# Patient Record
Sex: Female | Born: 1990 | Race: White | Hispanic: No | Marital: Single | Smoking: Current every day smoker
Health system: Southern US, Community
[De-identification: ages and names within clinical notes are randomized; demographics above are authoritative.]

## PROBLEM LIST (undated history)

## (undated) ENCOUNTER — Inpatient Hospital Stay (HOSPITAL_COMMUNITY): Payer: Self-pay

## (undated) ENCOUNTER — Ambulatory Visit

## (undated) DIAGNOSIS — K219 Gastro-esophageal reflux disease without esophagitis: Secondary | ICD-10-CM

## (undated) DIAGNOSIS — G4730 Sleep apnea, unspecified: Secondary | ICD-10-CM

## (undated) DIAGNOSIS — F32A Depression, unspecified: Secondary | ICD-10-CM

## (undated) DIAGNOSIS — N390 Urinary tract infection, site not specified: Secondary | ICD-10-CM

## (undated) DIAGNOSIS — B999 Unspecified infectious disease: Secondary | ICD-10-CM

## (undated) DIAGNOSIS — E282 Polycystic ovarian syndrome: Secondary | ICD-10-CM

## (undated) DIAGNOSIS — M1990 Unspecified osteoarthritis, unspecified site: Secondary | ICD-10-CM

## (undated) DIAGNOSIS — J45909 Unspecified asthma, uncomplicated: Secondary | ICD-10-CM

## (undated) DIAGNOSIS — F329 Major depressive disorder, single episode, unspecified: Secondary | ICD-10-CM

## (undated) DIAGNOSIS — R011 Cardiac murmur, unspecified: Secondary | ICD-10-CM

## (undated) DIAGNOSIS — F419 Anxiety disorder, unspecified: Secondary | ICD-10-CM

## (undated) HISTORY — DX: Unspecified osteoarthritis, unspecified site: M19.90

## (undated) HISTORY — PX: CHOLECYSTECTOMY: SHX55

## (undated) HISTORY — PX: HERNIA REPAIR: SHX51

## (undated) HISTORY — PX: HAND SURGERY: SHX662

## (undated) HISTORY — DX: Sleep apnea, unspecified: G47.30

---
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
## 2012-01-29 HISTORY — PX: CHOLECYSTECTOMY: SHX55

## 2012-02-04 LAB — OB RESULTS CONSOLE ABO/RH: RH Type: POSITIVE

## 2012-02-04 LAB — OB RESULTS CONSOLE HEPATITIS B SURFACE ANTIGEN: Hepatitis B Surface Ag: NEGATIVE

## 2012-02-04 LAB — OB RESULTS CONSOLE ANTIBODY SCREEN: Antibody Screen: NEGATIVE

## 2012-02-04 LAB — OB RESULTS CONSOLE RPR: RPR: NONREACTIVE

## 2012-02-04 LAB — CYSTIC FIBROSIS DIAGNOSTIC STUDY: Interpretation-CFDNA:: NEGATIVE

## 2012-02-04 LAB — OB RESULTS CONSOLE VARICELLA ZOSTER ANTIBODY, IGG: Varicella: IMMUNE

## 2012-03-24 ENCOUNTER — Inpatient Hospital Stay (HOSPITAL_COMMUNITY): Payer: Self-pay

## 2012-03-24 ENCOUNTER — Encounter (HOSPITAL_COMMUNITY): Payer: Self-pay

## 2012-03-24 ENCOUNTER — Inpatient Hospital Stay (HOSPITAL_COMMUNITY)
Admission: AD | Admit: 2012-03-24 | Discharge: 2012-03-24 | Disposition: A | Discharge status: Home / Self Care | Payer: Self-pay | Source: Ambulatory Visit | Attending: Obstetrics & Gynecology | Admitting: Obstetrics & Gynecology

## 2012-03-24 DIAGNOSIS — R109 Unspecified abdominal pain: Secondary | ICD-10-CM | POA: Insufficient documentation

## 2012-03-24 DIAGNOSIS — O4690 Antepartum hemorrhage, unspecified, unspecified trimester: Secondary | ICD-10-CM

## 2012-03-24 DIAGNOSIS — B9689 Other specified bacterial agents as the cause of diseases classified elsewhere: Secondary | ICD-10-CM

## 2012-03-24 DIAGNOSIS — O4692 Antepartum hemorrhage, unspecified, second trimester: Secondary | ICD-10-CM

## 2012-03-24 DIAGNOSIS — O2390 Unspecified genitourinary tract infection in pregnancy, unspecified trimester: Secondary | ICD-10-CM | POA: Insufficient documentation

## 2012-03-24 DIAGNOSIS — N72 Inflammatory disease of cervix uteri: Secondary | ICD-10-CM

## 2012-03-24 DIAGNOSIS — N760 Acute vaginitis: Secondary | ICD-10-CM | POA: Insufficient documentation

## 2012-03-24 DIAGNOSIS — A499 Bacterial infection, unspecified: Secondary | ICD-10-CM | POA: Insufficient documentation

## 2012-03-24 HISTORY — DX: Urinary tract infection, site not specified: N39.0

## 2012-03-24 HISTORY — DX: Unspecified infectious disease: B99.9

## 2012-03-24 HISTORY — DX: Cardiac murmur, unspecified: R01.1

## 2012-03-24 HISTORY — DX: Sleep apnea, unspecified: G47.30

## 2012-03-24 HISTORY — DX: Major depressive disorder, single episode, unspecified: F32.9

## 2012-03-24 HISTORY — DX: Anxiety disorder, unspecified: F41.9

## 2012-03-24 HISTORY — DX: Unspecified asthma, uncomplicated: J45.909

## 2012-03-24 HISTORY — DX: Polycystic ovarian syndrome: E28.2

## 2012-03-24 HISTORY — DX: Depression, unspecified: F32.A

## 2012-03-24 LAB — URINALYSIS, ROUTINE W REFLEX MICROSCOPIC
Bilirubin Urine: NEGATIVE
Glucose, UA: NEGATIVE mg/dL
Hgb urine dipstick: NEGATIVE
Ketones, ur: NEGATIVE mg/dL
pH: 8.5 — ABNORMAL HIGH (ref 5.0–8.0)

## 2012-03-24 LAB — CBC WITH DIFFERENTIAL/PLATELET
Basophils Absolute: 0 10*3/uL (ref 0.0–0.1)
Basophils Relative: 0 % (ref 0–1)
Eosinophils Relative: 1 % (ref 0–5)
Lymphocytes Relative: 21 % (ref 12–46)
MCHC: 34.2 g/dL (ref 30.0–36.0)
Neutro Abs: 6.3 10*3/uL (ref 1.7–7.7)
Platelets: 217 10*3/uL (ref 150–400)
RDW: 15.6 % — ABNORMAL HIGH (ref 11.5–15.5)
WBC: 8.7 10*3/uL (ref 4.0–10.5)

## 2012-03-24 LAB — WET PREP, GENITAL
Trich, Wet Prep: NONE SEEN
Yeast Wet Prep HPF POC: NONE SEEN

## 2012-03-24 LAB — ABO/RH: ABO/RH(D): A POS

## 2012-03-24 MED ORDER — AZITHROMYCIN 250 MG PO TABS
1000.0000 mg | ORAL_TABLET | Freq: Once | ORAL | Status: AC
  Administered 2012-03-24: 1000 mg via ORAL
  Filled 2012-03-24: qty 4

## 2012-03-24 MED ORDER — METRONIDAZOLE 500 MG PO TABS: 500.0000 mg | ORAL_TABLET | Freq: Two times a day (BID) | ORAL | Status: DC

## 2012-03-24 NOTE — MAU Note (Signed)
abd pain started last night, comes and goes, some pressure. Spotting this month, has had some spotting through out the preg, has not had an [place].

## 2012-03-24 NOTE — MAU Provider Note (Signed)
Attestation of Attending Supervision of Advanced Practitioner (CNM/NP): Evaluation and management procedures were performed by the Advanced Practitioner under my supervision and collaboration.  I have reviewed the Advanced Practitioner's note and chart, and I agree with the management and plan.  HARRAWAY-SMITH, Jaquon Gingerich 3:08 PM     

## 2012-03-24 NOTE — MAU Note (Signed)
Pt states has had 2 prenatal appts in [place], here today with lower abd cramping that radiates to her back. Has had spotting once or twice this month, however earlier today noted redder spotting than noted prior. Otherwise no abnormal vaginal discharge. Pain is intermittent, began last pm. Denies burning or urgency with voiding.

## 2012-03-24 NOTE — MAU Provider Note (Signed)
History     CSN: [id number]  Arrival date and time: 03/24/12 1020   None     Chief Complaint  Patient presents with  . Abdominal Cramping   HPI Taylor Buck is a 22 yo G1P0 female @[redacted]w[redacted]d  by LMP who presents to the MAU complaining of vaginal bleeding and abdominal cramping. She reports that she has had vaginal spotting throughout her whole pregnancy. She reports that the bleeding is never enough to soak a pad, and is only visible when she wipes. She denies any heavy vaginal bleeding, abnormal vaginal discharge or vaginal itching/lesions.   She reports that she has also had mild lower abdominal cramping for the last 3-4 weeks, and reports that the cramping spread to both sides this week, which worried her. She describes the pain as cramping and denies any sharp pains. She states that the pain comes and goes. Both her abdominal pain and her occasional lower back pain are relieved by tylenol. She denies any urinary symptoms. She reports mild nausea and vomiting throughout the last few weeks.  She reports that she has had high blood sugars during childhood and was placed on Glucophage for preventative measures, however states she has never been told she was a Type I or Type II diabetic and has never taken insulin. She is currently not on any diabetes medications, however checks her blood sugars regularly and reports they are on average between 110-120.  The patient is from [place], and has established prenatal care there.   OB History   Grav Para Term Preterm Abortions TAB SAB Ect Mult Living   1               Past Medical History  Diagnosis Date  . Sleep apnea   . Heart murmur   . Asthma   . Diabetes mellitus without complication     borderline, last A1c 6.1  . Urinary tract infection   . PCOS (polycystic ovarian syndrome)   . Infection     chlamydia age 65  . Anxiety   . Depression     Past Surgical History  Procedure Laterality Date  . Hernia repair      rt inguinal  . Hand surgery      reconstruction- injury due to frostbite as child    Family History  Problem Relation Age of Onset  . Diabetes Mother   . COPD Mother   . Diabetes Maternal Aunt   . Cancer Maternal Aunt     breast  . Diabetes Maternal Grandmother   . Cancer Maternal Grandmother     skin cancer  . Hearing loss Neg Hx     History  Substance Use Topics  . Smoking status: Former Smoker    Types: Cigarettes  . Smokeless tobacco: Never Used     Comment: Jan 2014  . Alcohol Use: No    Allergies:  Allergies  Allergen Reactions  . Milk-Related Compounds Anaphylaxis  . Latex Hives  . Penicillins Hives    Prescriptions prior to admission  Medication Sig Dispense Refill  . acetaminophen (TYLENOL) 325 MG tablet Take 650 mg by mouth daily as needed for pain.      . calcium carbonate (TUMS - DOSED IN MG ELEMENTAL CALCIUM) 500 MG chewable tablet Chew 1 tablet by mouth 4 (four) times daily as needed for heartburn.      . Prenatal Vit-Fe Fumarate-FA (PRENATAL MULTIVITAMIN) TABS Take 1 tablet by mouth at bedtime.        Review  of Systems  Constitutional: Negative for fever and chills.  Eyes: Negative for blurred vision and double vision.  Respiratory: Negative for cough and shortness of breath.   Cardiovascular: Positive for leg swelling. Negative for chest pain.  Gastrointestinal: Positive for nausea, vomiting and abdominal pain (bilateral lower abdominal cramping). Negative for heartburn, diarrhea and constipation.  Genitourinary: Negative for dysuria, urgency and frequency.  Musculoskeletal: Positive for back pain.  Neurological: Negative for dizziness and headaches.  Psychiatric/Behavioral: Negative for depression. The patient is not nervous/anxious.     Blood pressure 110/56, pulse 78, temperature 97.9 F (36.6 C), temperature source Oral, resp. rate 18, height 5' 5.5" (1.664 m), weight 102.683 kg (226 lb 6 oz), last menstrual period 12/03/2011.  Physical  Exam General appearance - alert, well appearing, and in no distress and oriented to person, place, and time Mental status - alert, oriented to person, place, and time, normal mood, behavior, speech, dress, motor activity, and thought processes Eyes - pupils equal and reactive, extraocular eye movements intact Neck - supple, no significant adenopathy Chest - clear to auscultation, no wheezes, rales or rhonchi, symmetric air entry Heart - normal rate, regular rhythm, normal S1, S2, no murmurs, rubs, clicks or gallops Abdomen - soft, nontender, nondistended, no masses or organomegaly bowel sounds normal Pelvic - VULVA: normal appearing vulva with no masses, tenderness or lesions, VAGINA: normal appearing vagina with normal color, no lesions, vaginal discharge - white and creamy, CERVIX: normal appearing cervix without discharge or lesions, friable cervix with bleeding, cervical discharge present - bloody, UTERUS: uterus is 16 week size, shape, consistency and nontender, ADNEXA: normal adnexa in size, nontender and no masses Musculoskeletal - full range of motion without pain Extremities - peripheral pulses normal, no pedal edema, no clubbing or cyanosis Skin - normal coloration and turgor, no rashes, no suspicious skin lesions noted   MAU Course  Procedures  Results for orders placed during the hospital encounter of 03/24/12 (from the past 24 hour(s))  URINALYSIS, ROUTINE W REFLEX MICROSCOPIC     Status: Abnormal   Collection Time    03/24/12 10:45 AM      Result Value Range   Color, Urine YELLOW  YELLOW   APPearance CLOUDY (*) CLEAR   Specific Gravity, Urine 1.015  1.005 - 1.030   pH 8.5 (*) 5.0 - 8.0   Glucose, UA NEGATIVE  NEGATIVE mg/dL   Hgb urine dipstick NEGATIVE  NEGATIVE   Bilirubin Urine NEGATIVE  NEGATIVE   Ketones, ur NEGATIVE  NEGATIVE mg/dL   Protein, ur NEGATIVE  NEGATIVE mg/dL   Urobilinogen, UA 0.2  0.0 - 1.0 mg/dL   Nitrite NEGATIVE  NEGATIVE   Leukocytes, UA NEGATIVE   NEGATIVE  CBC WITH DIFFERENTIAL     Status: Abnormal   Collection Time    03/24/12 11:41 AM      Result Value Range   WBC 8.7  4.0 - 10.5 K/uL   RBC 4.38  3.87 - 5.11 MIL/uL   Hemoglobin 12.0  12.0 - 15.0 g/dL   HCT 40.9 (*) 81.1 - 91.4 %   MCV 80.1  78.0 - 100.0 fL   MCH 27.4  26.0 - 34.0 pg   MCHC 34.2  30.0 - 36.0 g/dL   RDW 78.2 (*) 95.6 - 21.3 %   Platelets 217  150 - 400 K/uL   Neutrophils Relative 72  43 - 77 %   Neutro Abs 6.3  1.7 - 7.7 K/uL   Lymphocytes Relative 21  12 -  46 %   Lymphs Abs 1.8  0.7 - 4.0 K/uL   Monocytes Relative 5  3 - 12 %   Monocytes Absolute 0.5  0.1 - 1.0 K/uL   Eosinophils Relative 1  0 - 5 %   Eosinophils Absolute 0.1  0.0 - 0.7 K/uL   Basophils Relative 0  0 - 1 %   Basophils Absolute 0.0  0.0 - 0.1 K/uL  ABO/RH     Status: None   Collection Time    03/24/12 11:50 AM      Result Value Range   ABO/RH(D) A POS    WET PREP, GENITAL     Status: Abnormal   Collection Time    03/24/12 12:55 PM      Result Value Range   Yeast Wet Prep HPF POC NONE SEEN  NONE SEEN   Trich, Wet Prep NONE SEEN  NONE SEEN   Clue Cells Wet Prep HPF POC MODERATE (*) NONE SEEN   WBC, Wet Prep HPF POC MODERATE (*) NONE SEEN     Assessment and Plan  Assessment: Taylor Buck is a 22 yo female who presents to MAU for vaginal bleeding and abdominal cramping.  1. IUP @ [redacted]w[redacted]d by LMP (confirmed by [place] today) 2. Round ligament pain 3. Cervicitis 4. Bacterial Vaginosis  Plan: 1. IUP: Patient given information about 2nd trimester pregnancy and recommended patient to make an appointment with her OBGYN in [place] for her 18-20 week anatomical Korea and prenatal visit. 2. Round ligament pain: Continue to use tylenol for pain, rest when necessary, follow-up with OBGYN in [place]. 3. Cervicitis: Treated with Azithromycin 1g today. Follow-up with OBGYN in [place]. 4. BV: Treatment with Metronidazole 500 mg BID x 7days 5. Cultures for GC and Chlamydia pending Adela Glimpse 03/24/2012, 12:54 PM    I have examined this patient with the student and assisted with plan of care.  I have reviewed this patient's vital signs, nurses notes, appropriate labs and imaging.  Findings discussed with the patient.    Medication List    TAKE these medications       acetaminophen 325 MG tablet  Commonly known as:  TYLENOL  Take 650 mg by mouth daily as needed for pain.     calcium carbonate 500 MG chewable tablet  Commonly known as:  TUMS - dosed in mg elemental calcium  Chew 1 tablet by mouth 4 (four) times daily as needed for heartburn.     metroNIDAZOLE 500 MG tablet  Commonly known as:  FLAGYL  Take 1 tablet (500 mg total) by mouth 2 (two) times daily.     prenatal multivitamin Tabs  Take 1 tablet by mouth at bedtime.

## 2012-04-22 ENCOUNTER — Ambulatory Visit (INDEPENDENT_AMBULATORY_CARE_PROVIDER_SITE_OTHER): Payer: Medicaid Other

## 2012-04-22 ENCOUNTER — Other Ambulatory Visit: Payer: Medicaid Other

## 2012-04-22 ENCOUNTER — Encounter: Payer: Self-pay | Admitting: Obstetrics

## 2012-04-22 ENCOUNTER — Ambulatory Visit (INDEPENDENT_AMBULATORY_CARE_PROVIDER_SITE_OTHER): Payer: Medicaid Other | Admitting: Obstetrics

## 2012-04-22 VITALS — BP 98/48 | Temp 97.6°F | Wt 288.0 lb

## 2012-04-22 DIAGNOSIS — Z3400 Encounter for supervision of normal first pregnancy, unspecified trimester: Secondary | ICD-10-CM

## 2012-04-22 DIAGNOSIS — Z3201 Encounter for pregnancy test, result positive: Secondary | ICD-10-CM

## 2012-04-22 DIAGNOSIS — Z369 Encounter for antenatal screening, unspecified: Secondary | ICD-10-CM

## 2012-04-22 LAB — POCT URINALYSIS DIPSTICK
Blood, UA: NEGATIVE
Glucose, UA: NEGATIVE
Nitrite, UA: NEGATIVE
Protein, UA: NEGATIVE
Urobilinogen, UA: NEGATIVE

## 2012-04-22 MED ORDER — OMEPRAZOLE 20 MG PO CPDR: 20.0000 mg | DELAYED_RELEASE_CAPSULE | Freq: Every day | ORAL | Status: DC

## 2012-04-22 MED ORDER — OB COMPLETE PETITE 35-5-1-200 MG PO CAPS: 1.0000 | ORAL_CAPSULE | Freq: Every morning | ORAL | Status: DC

## 2012-04-22 NOTE — Progress Notes (Signed)
Heartburn.  Omeprazole Rx. 

## 2012-04-22 NOTE — Progress Notes (Signed)
Pulse: 72

## 2012-04-24 ENCOUNTER — Encounter: Payer: Self-pay | Admitting: Obstetrics

## 2012-04-26 ENCOUNTER — Encounter (HOSPITAL_COMMUNITY): Payer: Self-pay | Admitting: Nurse Practitioner

## 2012-04-26 ENCOUNTER — Emergency Department (HOSPITAL_COMMUNITY)
Admission: EM | Admit: 2012-04-26 | Discharge: 2012-04-26 | Disposition: A | Discharge status: Home / Self Care | Payer: Medicaid Other | Attending: Emergency Medicine | Admitting: Emergency Medicine

## 2012-04-26 DIAGNOSIS — R350 Frequency of micturition: Secondary | ICD-10-CM | POA: Insufficient documentation

## 2012-04-26 DIAGNOSIS — R112 Nausea with vomiting, unspecified: Secondary | ICD-10-CM

## 2012-04-26 DIAGNOSIS — R011 Cardiac murmur, unspecified: Secondary | ICD-10-CM | POA: Insufficient documentation

## 2012-04-26 DIAGNOSIS — N390 Urinary tract infection, site not specified: Secondary | ICD-10-CM

## 2012-04-26 DIAGNOSIS — Z8659 Personal history of other mental and behavioral disorders: Secondary | ICD-10-CM | POA: Insufficient documentation

## 2012-04-26 DIAGNOSIS — Z8739 Personal history of other diseases of the musculoskeletal system and connective tissue: Secondary | ICD-10-CM | POA: Insufficient documentation

## 2012-04-26 DIAGNOSIS — O99330 Smoking (tobacco) complicating pregnancy, unspecified trimester: Secondary | ICD-10-CM | POA: Insufficient documentation

## 2012-04-26 DIAGNOSIS — O2390 Unspecified genitourinary tract infection in pregnancy, unspecified trimester: Secondary | ICD-10-CM | POA: Insufficient documentation

## 2012-04-26 DIAGNOSIS — O9989 Other specified diseases and conditions complicating pregnancy, childbirth and the puerperium: Secondary | ICD-10-CM | POA: Insufficient documentation

## 2012-04-26 DIAGNOSIS — Z79899 Other long term (current) drug therapy: Secondary | ICD-10-CM | POA: Insufficient documentation

## 2012-04-26 DIAGNOSIS — Z8619 Personal history of other infectious and parasitic diseases: Secondary | ICD-10-CM | POA: Insufficient documentation

## 2012-04-26 DIAGNOSIS — Z3490 Encounter for supervision of normal pregnancy, unspecified, unspecified trimester: Secondary | ICD-10-CM

## 2012-04-26 DIAGNOSIS — O219 Vomiting of pregnancy, unspecified: Secondary | ICD-10-CM | POA: Insufficient documentation

## 2012-04-26 DIAGNOSIS — J45909 Unspecified asthma, uncomplicated: Secondary | ICD-10-CM | POA: Insufficient documentation

## 2012-04-26 LAB — URINALYSIS, MICROSCOPIC ONLY
Hgb urine dipstick: NEGATIVE
Specific Gravity, Urine: 1.027 (ref 1.005–1.030)
Urobilinogen, UA: 1 mg/dL (ref 0.0–1.0)
pH: 8.5 — ABNORMAL HIGH (ref 5.0–8.0)

## 2012-04-26 LAB — COMPREHENSIVE METABOLIC PANEL
ALT: 13 U/L (ref 0–35)
AST: 16 U/L (ref 0–37)
Albumin: 3.2 g/dL — ABNORMAL LOW (ref 3.5–5.2)
Alkaline Phosphatase: 88 U/L (ref 39–117)
Calcium: 8.9 mg/dL (ref 8.4–10.5)
Potassium: 3.5 mEq/L (ref 3.5–5.1)
Sodium: 134 mEq/L — ABNORMAL LOW (ref 135–145)
Total Protein: 7.1 g/dL (ref 6.0–8.3)

## 2012-04-26 LAB — CBC WITH DIFFERENTIAL/PLATELET
Basophils Absolute: 0 10*3/uL (ref 0.0–0.1)
Basophils Relative: 0 % (ref 0–1)
Eosinophils Absolute: 0 10*3/uL (ref 0.0–0.7)
Eosinophils Relative: 0 % (ref 0–5)
Lymphs Abs: 0.6 10*3/uL — ABNORMAL LOW (ref 0.7–4.0)
MCH: 28 pg (ref 26.0–34.0)
Neutrophils Relative %: 87 % — ABNORMAL HIGH (ref 43–77)
Platelets: 202 10*3/uL (ref 150–400)
RBC: 4.46 MIL/uL (ref 3.87–5.11)
RDW: 14.5 % (ref 11.5–15.5)

## 2012-04-26 LAB — POCT PREGNANCY, URINE: Preg Test, Ur: POSITIVE — AB

## 2012-04-26 MED ORDER — ONDANSETRON 8 MG PO TBDP: 8.0000 mg | ORAL_TABLET | Freq: Three times a day (TID) | ORAL | Status: DC | PRN

## 2012-04-26 MED ORDER — CEPHALEXIN 500 MG PO CAPS: 500.0000 mg | ORAL_CAPSULE | Freq: Four times a day (QID) | ORAL | Status: DC

## 2012-04-26 MED ORDER — SODIUM CHLORIDE 0.9 % IV SOLN
1000.0000 mL | Freq: Once | INTRAVENOUS | Status: AC
  Administered 2012-04-26: 1000 mL via INTRAVENOUS

## 2012-04-26 MED ORDER — DEXTROSE 5 % IV SOLN
1.0000 g | Freq: Once | INTRAVENOUS | Status: AC
  Administered 2012-04-26: 1 g via INTRAVENOUS
  Filled 2012-04-26: qty 10

## 2012-04-26 MED ORDER — ONDANSETRON HCL 4 MG/2ML IJ SOLN
4.0000 mg | Freq: Once | INTRAMUSCULAR | Status: AC
  Administered 2012-04-26: 4 mg via INTRAVENOUS
  Filled 2012-04-26: qty 2

## 2012-04-26 MED ORDER — SODIUM CHLORIDE 0.9 % IV SOLN: 1000.0000 mL | INTRAVENOUS | Status: DC

## 2012-04-26 NOTE — ED Provider Notes (Signed)
History     CSN: [id number]  Arrival date & time 04/26/12  1340   First MD Initiated Contact with Patient 04/26/12 1632      Chief Complaint  Patient presents with  . Nausea     The history is provided by the patient.   patient is a G1 P0 at [redacted] weeks pregnant with a known intrauterine pregnancy based on first trimester ultrasound.  She presents with several days of nausea and vomiting without diarrhea.  Mild decreased oral intake.  She does report some increased urinary frequency over the past several days.  No dysuria.  No flank pain.  No fevers or chills.  She continues to feel the baby move.  No vaginal bleeding or vaginal discharge.  Her symptoms are mild to moderate in severity nothing improves or worsens her symptoms.  No hematemesis.  No recent sick contacts. Past Medical History  Diagnosis Date  . Sleep apnea   . Heart murmur   . Asthma   . Urinary tract infection   . PCOS (polycystic ovarian syndrome)   . Infection     chlamydia age 65  . Anxiety   . Depression   . Arthritis     Past Surgical History  Procedure Laterality Date  . Hernia repair      rt inguinal  . Hand surgery      reconstruction- injury due to frostbite as child    Family History  Problem Relation Age of Onset  . Diabetes Mother   . COPD Mother   . Diabetes Maternal Aunt   . Cancer Maternal Aunt     breast  . Diabetes Maternal Grandmother   . Cancer Maternal Grandmother     skin cancer  . Hearing loss Neg Hx   . Alcoholism    . Arthritis    . Asthma    . Depression      History  Substance Use Topics  . Smoking status: Current Every Day Smoker -- 0.25 packs/day    Types: Cigarettes  . Smokeless tobacco: Never Used     Comment: Jan 2014  . Alcohol Use: No    OB History   Grav Para Term Preterm Abortions TAB SAB Ect Mult Living   1               Review of Systems  All other systems reviewed and are negative.    Allergies  Milk-related compounds; Latex; and  Penicillins  Home Medications   Current Outpatient Rx  Name  Route  Sig  Dispense  Refill  . acetaminophen (TYLENOL) 325 MG tablet   Oral   Take 650 mg by mouth daily as needed for pain.         . calcium carbonate (TUMS - DOSED IN MG ELEMENTAL CALCIUM) 500 MG chewable tablet   Oral   Chew 1 tablet by mouth 4 (four) times daily as needed for heartburn.         . metroNIDAZOLE (FLAGYL) 500 MG tablet   Oral   Take 1 tablet (500 mg total) by mouth 2 (two) times daily.   14 tablet   0   . omeprazole (PRILOSEC) 20 MG capsule   Oral   Take 1 capsule (20 mg total) by mouth daily.   60 capsule   5   . pantoprazole (PROTONIX) 40 MG tablet   Oral   Take 40 mg by mouth daily as needed.         . Prenat-FeCbn-FeAspGl-FA-Omega (  OB COMPLETE PETITE) 35-5-1-200 MG CAPS   Oral   Take 1 capsule by mouth every morning.   90 capsule   3   . Prenatal Vit-Fe Fumarate-FA (PRENATAL MULTIVITAMIN) TABS   Oral   Take 1 tablet by mouth at bedtime.           BP 119/67  Pulse 105  Temp(Src) 98.3 F (36.8 C) (Oral)  Resp 20  SpO2 97%  LMP 12/03/2011  Physical Exam  Nursing note and vitals reviewed. Constitutional: She is oriented to person, place, and time. She appears well-developed and well-nourished. No distress.  HENT:  Head: Normocephalic and atraumatic.  Eyes: EOM are normal.  Neck: Normal range of motion.  Cardiovascular: Normal rate, regular rhythm and normal heart sounds.   Pulmonary/Chest: Effort normal and breath sounds normal.  Abdominal: Soft. She exhibits no distension. There is no tenderness.  Gravid uterus  Genitourinary:  No CVA tenderness  Musculoskeletal: Normal range of motion.  Neurological: She is alert and oriented to person, place, and time.  Skin: Skin is warm and dry.  Psychiatric: She has a normal mood and affect. Judgment normal.    ED Course  Procedures (including critical care time)  OBSTETRICS REPORT (ULTRASOUND) (Signed Final  03/24/2012 12:47 pm) ---------------------------------------------------------------------- Fetal Evaluation Num Of Fetuses: 1 Fetal Heart Rate: 143 bpm Cardiac Activity: Observed Presentation: Breech Placenta: Fundal, above cervical os Amniotic Fluid AFI FV: Subjectively within normal limits Larg Pckt: 6.5 cm ---------------------------------------------------------------------- Biometry BPD: 36.7 mm G. Age: 17w 1d ---------------------------------------------------------------------- Gestational Age LMP: 16w 0d Date: 12/03/11 EDD: 09/08/12 Clinical EDD: [place] 0d EDD: 09/08/12 U/S Today: 17w 1d EDD: 08/31/12 Best: 16w 0d Det. By: LMP (12/03/11) EDD: 09/08/12 ---------------------------------------------------------------------- Cervix Uterus Adnexa Cervical Length: 3.29 cm Cervix: Normal appearance by transabdominal scan. Left Ovary: Size(cm) L: 2.33 x W: 1.59 x H: 1.55 Volume(cc): 3 Right Ovary: Size(cm) L: 2.09 x W: 1.65 x H: 1.29 Volume(cc): 2.3 Adnexa: No abnormality visualized. ---------------------------------------------------------------------- Impression Single live IUP in breech presentation. Concordant measurements/assigned GA by LMP. Recommend routine anatomic survey at 18-20 weeks. No acute abnormality.   Labs Reviewed  CBC WITH DIFFERENTIAL - Abnormal; Notable for the following:    HCT 35.0 (*)    Neutrophils Relative 87 (*)    Lymphocytes Relative 7 (*)    Lymphs Abs 0.6 (*)    All other components within normal limits  COMPREHENSIVE METABOLIC PANEL - Abnormal; Notable for the following:    Sodium 134 (*)    Albumin 3.2 (*)    All other components within normal limits  URINALYSIS, MICROSCOPIC ONLY - Abnormal; Notable for the following:    Color, Urine AMBER (*)    APPearance HAZY (*)    pH 8.5 (*)    Bilirubin Urine SMALL (*)    Ketones, ur 15 (*)    Protein, ur 100 (*)    Leukocytes, UA MODERATE (*)    Bacteria, UA FEW (*)    Squamous Epithelial /  LPF MANY (*)    Casts HYALINE CASTS (*)    All other components within normal limits  POCT PREGNANCY, URINE - Abnormal; Notable for the following:    Preg Test, Ur POSITIVE (*)    All other components within normal limits  URINE CULTURE  LIPASE, BLOOD   No results found.   No diagnosis found.    MDM  5:44 PM The patient feels much better at this time.  This is likely nausea vomiting unrelated to her pregnancy.  She may have  a urinary tract infection.  Rocephin given in the emergency department.  Home with Keflex.  Urine culture pending.  Antibiotics can be stopped when she follows up with her OB/GYN if her urine culture is negative.  Well-appearing.  [redacted] weeks pregnant.        Lyanne Co, MD 04/26/12 1800

## 2012-04-26 NOTE — ED Notes (Addendum)
Pt c/o n/v since yesterday. Unable to tolerate any oral intake. Pt is [redacted] weeks pregnant. Denies any pelvic pain, discharge, bleeding.

## 2012-04-27 LAB — URINE CULTURE: Colony Count: 70000

## 2012-05-08 ENCOUNTER — Encounter: Payer: Self-pay | Admitting: *Deleted

## 2012-05-21 ENCOUNTER — Encounter: Payer: Self-pay | Admitting: Obstetrics

## 2012-05-21 ENCOUNTER — Encounter: Payer: Medicaid Other | Admitting: Obstetrics

## 2012-06-25 ENCOUNTER — Encounter: Payer: Self-pay | Admitting: Obstetrics

## 2012-06-25 LAB — US OB DETAIL + 14 WK

## 2012-12-03 ENCOUNTER — Other Ambulatory Visit: Payer: Self-pay

## 2013-01-27 ENCOUNTER — Encounter (HOSPITAL_COMMUNITY): Payer: Self-pay | Admitting: *Deleted

## 2013-11-29 ENCOUNTER — Encounter (HOSPITAL_COMMUNITY): Payer: Self-pay | Admitting: *Deleted

## 2017-04-25 ENCOUNTER — Emergency Department (HOSPITAL_COMMUNITY): Payer: Medicaid Other

## 2017-04-25 ENCOUNTER — Emergency Department (HOSPITAL_COMMUNITY)
Admission: EM | Admit: 2017-04-25 | Discharge: 2017-04-25 | Disposition: A | Discharge status: Home / Self Care | Payer: Medicaid Other | Attending: Physician Assistant | Admitting: Physician Assistant

## 2017-04-25 ENCOUNTER — Encounter (HOSPITAL_COMMUNITY): Payer: Self-pay | Admitting: Emergency Medicine

## 2017-04-25 DIAGNOSIS — O2310 Infections of bladder in pregnancy, unspecified trimester: Secondary | ICD-10-CM | POA: Diagnosis not present

## 2017-04-25 DIAGNOSIS — R102 Pelvic and perineal pain: Secondary | ICD-10-CM | POA: Diagnosis not present

## 2017-04-25 DIAGNOSIS — Z79899 Other long term (current) drug therapy: Secondary | ICD-10-CM | POA: Diagnosis not present

## 2017-04-25 DIAGNOSIS — Z3A00 Weeks of gestation of pregnancy not specified: Secondary | ICD-10-CM | POA: Diagnosis not present

## 2017-04-25 DIAGNOSIS — Y929 Unspecified place or not applicable: Secondary | ICD-10-CM | POA: Diagnosis not present

## 2017-04-25 DIAGNOSIS — Y939 Activity, unspecified: Secondary | ICD-10-CM | POA: Insufficient documentation

## 2017-04-25 DIAGNOSIS — Z91040 Latex allergy status: Secondary | ICD-10-CM | POA: Insufficient documentation

## 2017-04-25 DIAGNOSIS — Y999 Unspecified external cause status: Secondary | ICD-10-CM | POA: Insufficient documentation

## 2017-04-25 DIAGNOSIS — O9989 Other specified diseases and conditions complicating pregnancy, childbirth and the puerperium: Secondary | ICD-10-CM | POA: Diagnosis not present

## 2017-04-25 DIAGNOSIS — N3000 Acute cystitis without hematuria: Secondary | ICD-10-CM

## 2017-04-25 DIAGNOSIS — X58XXXA Exposure to other specified factors, initial encounter: Secondary | ICD-10-CM | POA: Diagnosis not present

## 2017-04-25 DIAGNOSIS — O26891 Other specified pregnancy related conditions, first trimester: Secondary | ICD-10-CM

## 2017-04-25 DIAGNOSIS — S39012A Strain of muscle, fascia and tendon of lower back, initial encounter: Secondary | ICD-10-CM | POA: Insufficient documentation

## 2017-04-25 DIAGNOSIS — O99331 Smoking (tobacco) complicating pregnancy, first trimester: Secondary | ICD-10-CM | POA: Diagnosis not present

## 2017-04-25 DIAGNOSIS — J45909 Unspecified asthma, uncomplicated: Secondary | ICD-10-CM | POA: Diagnosis not present

## 2017-04-25 DIAGNOSIS — F17210 Nicotine dependence, cigarettes, uncomplicated: Secondary | ICD-10-CM | POA: Insufficient documentation

## 2017-04-25 DIAGNOSIS — R109 Unspecified abdominal pain: Secondary | ICD-10-CM

## 2017-04-25 LAB — WET PREP, GENITAL
Clue Cells Wet Prep HPF POC: NONE SEEN
SPERM: NONE SEEN
Trich, Wet Prep: NONE SEEN
YEAST WET PREP: NONE SEEN

## 2017-04-25 LAB — URINALYSIS, ROUTINE W REFLEX MICROSCOPIC
BILIRUBIN URINE: NEGATIVE
Glucose, UA: NEGATIVE mg/dL
HGB URINE DIPSTICK: NEGATIVE
Ketones, ur: NEGATIVE mg/dL
NITRITE: NEGATIVE
Protein, ur: NEGATIVE mg/dL
SPECIFIC GRAVITY, URINE: 1.012 (ref 1.005–1.030)
pH: 6 (ref 5.0–8.0)

## 2017-04-25 LAB — HCG, QUANTITATIVE, PREGNANCY: HCG, BETA CHAIN, QUANT, S: 117 m[IU]/mL — AB (ref <5)

## 2017-04-25 LAB — POC URINE PREG, ED: Preg Test, Ur: POSITIVE — AB

## 2017-04-25 MED ORDER — ACETAMINOPHEN 325 MG PO TABS
650.0000 mg | ORAL_TABLET | Freq: Once | ORAL | Status: DC
  Filled 2017-04-25: qty 2

## 2017-04-25 MED ORDER — NITROFURANTOIN MONOHYD MACRO 100 MG PO CAPS: 100.0000 mg | ORAL_CAPSULE | Freq: Two times a day (BID) | ORAL | 0 refills | Status: DC

## 2017-04-25 NOTE — ED Notes (Signed)
Pt aware of need for another urine sample
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
## 2017-04-25 NOTE — Discharge Instructions (Signed)
Go [facility] on Monday at 2 pm to recheck your pregnancy hormone level and follow up. If you have worsening symptoms before then go to the Maternity Admissions (their ED) for further evaluation.

## 2017-04-25 NOTE — ED Triage Notes (Addendum)
States haas lower back pain since 073 this am states hurts across back  Denies dysuria crying and cannot sit still, pain comes and goes and radiates down legs

## 2017-04-25 NOTE — ED Notes (Signed)
Patient transported to US 

## 2017-04-26 LAB — URINE CULTURE

## 2017-04-27 ENCOUNTER — Telehealth: Payer: Self-pay

## 2017-04-27 NOTE — Telephone Encounter (Signed)
Post ED Visit - Positive Culture Follow-up  Culture report reviewed by antimicrobial stewardship pharmacist:  []  Taylor Buck, Pharm.D. []  Taylor Buck, Pharm.D., BCPS AQ-ID []  Taylor Buck, Pharm.D., BCPS []  Taylor Buck, Pharm.D., BCPS []  Taylor Buck, [place]arm.[place]., BCPS, AAHIVP []  Taylor Buck, Pharm.D., BCPS, AAHIVP []  Taylor Buck, PharmD, BCPS []  Taylor Buck, PharmD []  Taylor Buck, PharmD, BCPS [facility] D Positive urine culture Treated with Nitrofurantoin Monohyd Macro, organism sensitive to the same and no further patient follow-up is required at this time.  Taylor Buck, Taylor Buck 04/27/2017, 10:46 AM

## 2017-04-28 ENCOUNTER — Telehealth: Payer: Self-pay | Admitting: General Practice

## 2017-04-28 ENCOUNTER — Ambulatory Visit (INDEPENDENT_AMBULATORY_CARE_PROVIDER_SITE_OTHER): Payer: Medicaid Other | Admitting: General Practice

## 2017-04-28 DIAGNOSIS — O3680X0 Pregnancy with inconclusive fetal viability, not applicable or unspecified: Secondary | ICD-10-CM

## 2017-04-28 DIAGNOSIS — O283 Abnormal ultrasonic finding on antenatal screening of mother: Secondary | ICD-10-CM

## 2017-04-28 LAB — HCG, QUANTITATIVE, PREGNANCY: HCG, BETA CHAIN, QUANT, S: 258 m[IU]/mL — AB (ref <5)

## 2017-04-28 LAB — GC/CHLAMYDIA PROBE AMP (~~LOC~~) NOT AT ARMC
CHLAMYDIA, DNA PROBE: NEGATIVE
NEISSERIA GONORRHEA: NEGATIVE
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
## 2017-04-28 NOTE — Telephone Encounter (Signed)
Called patient & informed her of results. Patient verbalized understanding and plans to follow up for TAB. Patient had no questions

## 2017-04-28 NOTE — Progress Notes (Signed)
I have reviewed the nurse's note, and agree with the plan of care.  Thressa ShellerHeather Manuel Lawhead 4:21 PM 04/28/17

## 2017-04-30 ENCOUNTER — Encounter: Payer: Self-pay | Admitting: General Practice

## 2017-10-08 ENCOUNTER — Ambulatory Visit: Payer: Medicaid Other

## 2017-10-08 ENCOUNTER — Encounter (HOSPITAL_COMMUNITY): Payer: Self-pay | Admitting: *Deleted

## 2017-10-08 ENCOUNTER — Other Ambulatory Visit: Payer: Self-pay

## 2017-10-08 ENCOUNTER — Inpatient Hospital Stay (HOSPITAL_COMMUNITY)
Admission: AD | Admit: 2017-10-08 | Discharge: 2017-10-08 | Disposition: A | Discharge status: Home / Self Care | Payer: Medicaid Other | Source: Ambulatory Visit | Attending: Obstetrics & Gynecology | Admitting: Obstetrics & Gynecology

## 2017-10-08 DIAGNOSIS — O26891 Other specified pregnancy related conditions, first trimester: Secondary | ICD-10-CM | POA: Diagnosis not present

## 2017-10-08 DIAGNOSIS — R51 Headache: Secondary | ICD-10-CM | POA: Insufficient documentation

## 2017-10-08 DIAGNOSIS — F329 Major depressive disorder, single episode, unspecified: Secondary | ICD-10-CM | POA: Insufficient documentation

## 2017-10-08 DIAGNOSIS — J45909 Unspecified asthma, uncomplicated: Secondary | ICD-10-CM | POA: Diagnosis not present

## 2017-10-08 DIAGNOSIS — O99351 Diseases of the nervous system complicating pregnancy, first trimester: Secondary | ICD-10-CM | POA: Insufficient documentation

## 2017-10-08 DIAGNOSIS — O99281 Endocrine, nutritional and metabolic diseases complicating pregnancy, first trimester: Secondary | ICD-10-CM | POA: Insufficient documentation

## 2017-10-08 DIAGNOSIS — O9A211 Injury, poisoning and certain other consequences of external causes complicating pregnancy, first trimester: Secondary | ICD-10-CM | POA: Diagnosis not present

## 2017-10-08 DIAGNOSIS — F419 Anxiety disorder, unspecified: Secondary | ICD-10-CM | POA: Diagnosis not present

## 2017-10-08 DIAGNOSIS — T50905A Adverse effect of unspecified drugs, medicaments and biological substances, initial encounter: Secondary | ICD-10-CM | POA: Diagnosis not present

## 2017-10-08 DIAGNOSIS — E282 Polycystic ovarian syndrome: Secondary | ICD-10-CM | POA: Diagnosis not present

## 2017-10-08 DIAGNOSIS — O99341 Other mental disorders complicating pregnancy, first trimester: Secondary | ICD-10-CM | POA: Insufficient documentation

## 2017-10-08 DIAGNOSIS — Z79899 Other long term (current) drug therapy: Secondary | ICD-10-CM | POA: Insufficient documentation

## 2017-10-08 DIAGNOSIS — Z880 Allergy status to penicillin: Secondary | ICD-10-CM | POA: Diagnosis not present

## 2017-10-08 DIAGNOSIS — O219 Vomiting of pregnancy, unspecified: Secondary | ICD-10-CM | POA: Insufficient documentation

## 2017-10-08 DIAGNOSIS — G4730 Sleep apnea, unspecified: Secondary | ICD-10-CM | POA: Diagnosis not present

## 2017-10-08 DIAGNOSIS — O99511 Diseases of the respiratory system complicating pregnancy, first trimester: Secondary | ICD-10-CM | POA: Insufficient documentation

## 2017-10-08 DIAGNOSIS — Z3A01 Less than 8 weeks gestation of pregnancy: Secondary | ICD-10-CM | POA: Diagnosis not present

## 2017-10-08 DIAGNOSIS — Z3201 Encounter for pregnancy test, result positive: Secondary | ICD-10-CM | POA: Diagnosis not present

## 2017-10-08 DIAGNOSIS — F17210 Nicotine dependence, cigarettes, uncomplicated: Secondary | ICD-10-CM | POA: Diagnosis not present

## 2017-10-08 DIAGNOSIS — M549 Dorsalgia, unspecified: Secondary | ICD-10-CM | POA: Insufficient documentation

## 2017-10-08 DIAGNOSIS — O99331 Smoking (tobacco) complicating pregnancy, first trimester: Secondary | ICD-10-CM | POA: Insufficient documentation

## 2017-10-08 LAB — URINALYSIS, MICROSCOPIC (REFLEX): RBC / HPF: NONE SEEN RBC/hpf (ref 0–5)

## 2017-10-08 LAB — URINALYSIS, ROUTINE W REFLEX MICROSCOPIC
BILIRUBIN URINE: NEGATIVE
Glucose, UA: NEGATIVE mg/dL
Hgb urine dipstick: NEGATIVE
KETONES UR: NEGATIVE mg/dL
Leukocytes, UA: NEGATIVE
NITRITE: NEGATIVE
PROTEIN: 30 mg/dL — AB
SPECIFIC GRAVITY, URINE: 1.01 (ref 1.005–1.030)
pH: >9 — ABNORMAL HIGH (ref 5.0–8.0)

## 2017-10-08 LAB — POCT PREGNANCY, URINE: PREG TEST UR: POSITIVE — AB

## 2017-10-08 MED ORDER — PROMETHAZINE HCL 12.5 MG PO TABS: 12.5000 mg | ORAL_TABLET | Freq: Four times a day (QID) | ORAL | 0 refills | Status: DC | PRN

## 2017-10-08 MED ORDER — OMEPRAZOLE 20 MG PO CPDR: 20.0000 mg | DELAYED_RELEASE_CAPSULE | Freq: Every day | ORAL | 5 refills | Status: DC

## 2017-10-08 MED ORDER — DEXTROSE 5 % IN LACTATED RINGERS IV BOLUS
1000.0000 mL | Freq: Once | INTRAVENOUS | Status: AC
  Administered 2017-10-08: 1000 mL via INTRAVENOUS

## 2017-10-08 MED ORDER — METOCLOPRAMIDE HCL 5 MG/ML IJ SOLN
10.0000 mg | Freq: Once | INTRAMUSCULAR | Status: AC
  Administered 2017-10-08: 10 mg via INTRAVENOUS
  Filled 2017-10-08: qty 2

## 2017-10-08 NOTE — MAU Note (Signed)
Has been sick the past couple wks, thought she had the flu; throwing up- bad headaches,  When realized she had missed her period, did a test +HPT this morning.  Hx of 3 SAB since delivery.

## 2017-10-08 NOTE — MAU Note (Signed)
Pt states she needs proof of pregnancy
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
## 2017-10-08 NOTE — Discharge Instructions (Signed)
Morning Sickness °Morning sickness is when you feel sick to your stomach (nauseous) during pregnancy. This nauseous feeling may or may not come with vomiting. It often occurs in the morning but can be a problem any time of day. Morning sickness is most common during the first trimester, but it may continue throughout pregnancy. While morning sickness is unpleasant, it is usually harmless unless you develop severe and continual vomiting (hyperemesis gravidarum). This condition requires more intense treatment. °What are the causes? °The cause of morning sickness is not completely known but seems to be related to normal hormonal changes that occur in pregnancy. °What increases the risk? °You are at greater risk if you: °· Experienced nausea or vomiting before your pregnancy. °· Had morning sickness during a previous pregnancy. °· Are pregnant with more than one baby, such as twins. ° °How is this treated? °Do not use any medicines (prescription, over-the-counter, or herbal) for morning sickness without first talking to your health care provider. Your health care provider may prescribe or recommend: °· Vitamin B6 supplements. °· Anti-nausea medicines. °· The herbal medicine ginger. ° °Follow these instructions at home: °· Only take over-the-counter or prescription medicines as directed by your health care provider. °· Taking multivitamins before getting pregnant can prevent or decrease the severity of morning sickness in most women. °· Eat a piece of dry toast or unsalted crackers before getting out of bed in the morning. °· Eat five or six small meals a day. °· Eat dry and bland foods (rice, baked potato). Foods high in carbohydrates are often helpful. °· Do not drink liquids with your meals. Drink liquids between meals. °· Avoid greasy, fatty, and spicy foods. °· Get someone to cook for you if the smell of any food causes nausea and vomiting. °· If you feel nauseous after taking prenatal vitamins, take the vitamins at  night or with a snack. °· Snack on protein foods (nuts, yogurt, cheese) between meals if you are hungry. °· Eat unsweetened gelatins for desserts. °· Wearing an acupressure wristband (worn for sea sickness) may be helpful. °· Acupuncture may be helpful. °· Do not smoke. °· Get a humidifier to keep the air in your house free of odors. °· Get plenty of fresh air. °Contact a health care provider if: °· Your home remedies are not working, and you need medicine. °· You feel dizzy or lightheaded. °· You are losing weight. °Get help right away if: °· You have persistent and uncontrolled nausea and vomiting. °· You pass out (faint). °This information is not intended to replace advice given to you by your health care provider. Make sure you discuss any questions you have with your health care provider. °Document Released: 03/07/2006 Document Revised: 06/22/2015 Document Reviewed: 07/01/2012 °Elsevier Interactive Patient Education © 2017 [employer] ° °

## 2017-10-08 NOTE — Progress Notes (Signed)
Pt states she must leave now & appears to be very anxious; is requesting immediate removal of IV.  IV removed per pt request, NP notified of pt reaction & is to bedside at this time.

## 2017-10-09 LAB — CULTURE, OB URINE: SPECIAL REQUESTS: NORMAL

## 2017-11-06 ENCOUNTER — Other Ambulatory Visit (HOSPITAL_COMMUNITY)
Admission: RE | Admit: 2017-11-06 | Discharge: 2017-11-06 | Disposition: A | Discharge status: Home / Self Care | Payer: Medicaid Other | Source: Ambulatory Visit | Attending: Obstetrics and Gynecology | Admitting: Obstetrics and Gynecology

## 2017-11-06 ENCOUNTER — Ambulatory Visit (INDEPENDENT_AMBULATORY_CARE_PROVIDER_SITE_OTHER): Payer: Medicaid Other | Admitting: Obstetrics and Gynecology

## 2017-11-06 ENCOUNTER — Encounter: Payer: Self-pay | Admitting: Obstetrics and Gynecology

## 2017-11-06 VITALS — BP 111/69 | HR 97 | Wt 196.6 lb

## 2017-11-06 DIAGNOSIS — F17210 Nicotine dependence, cigarettes, uncomplicated: Secondary | ICD-10-CM | POA: Diagnosis not present

## 2017-11-06 DIAGNOSIS — Z3A10 10 weeks gestation of pregnancy: Secondary | ICD-10-CM | POA: Insufficient documentation

## 2017-11-06 DIAGNOSIS — Z23 Encounter for immunization: Secondary | ICD-10-CM | POA: Diagnosis not present

## 2017-11-06 DIAGNOSIS — O0991 Supervision of high risk pregnancy, unspecified, first trimester: Secondary | ICD-10-CM

## 2017-11-06 DIAGNOSIS — F17200 Nicotine dependence, unspecified, uncomplicated: Secondary | ICD-10-CM

## 2017-11-06 DIAGNOSIS — O0990 Supervision of high risk pregnancy, unspecified, unspecified trimester: Secondary | ICD-10-CM | POA: Insufficient documentation

## 2017-11-06 DIAGNOSIS — O99331 Smoking (tobacco) complicating pregnancy, first trimester: Secondary | ICD-10-CM

## 2017-11-06 NOTE — Progress Notes (Signed)
INITIAL PRENATAL VISIT NOTE  Subjective:  Taylor Buck is a 27 y.o. U9W1191 at [redacted]w[redacted]d by LMP being seen today for her initial prenatal visit. This is a planned pregnancy however she does not have partner involvement but happy with the pregnancy and planning to continue. She was using nothing for birth control previously.   She has an obstetric history significant for 1 x SVD @ 34 weeks. Reports started having pre-term labor, started having contractions around 18 weeks, but did not dilate at that time. Then dilated around 27 weeks, subsequently delivered at 34 weeks.   She has a medical history significant for n/a. Had sleep apnea and asthma that have both improved with weight loss (has lost ~ 100 pounds in the last year). Done through exercise and diet.   Patient reports no complaints.  Contractions: Not present. Vag. Bleeding: None.  Movement: Present. Denies leaking of fluid.    Past Medical History:  Diagnosis Date  . Anxiety   . Arthritis   . Asthma   . Depression   . Heart murmur   . Infection    chlamydia age 31  . PCOS (polycystic ovarian syndrome)   . Sleep apnea   . Urinary tract infection     Past Surgical History:  Procedure Laterality Date  . HAND SURGERY     reconstruction- injury due to frostbite as child  . HERNIA REPAIR     rt inguinal    OB History  Gravida Para Term Preterm AB Living  6 1 0 1 4 1   SAB TAB Ectopic Multiple Live Births  4       1    # Outcome Date GA Lbr Len/2nd Weight Sex Delivery Anes PTL Lv  6 Current           5 SAB 04/2017          4 Preterm 07/31/12 [redacted]w[redacted]d 07:00  M Vag-Spont None Y LIV  3 SAB           2 SAB           1 SAB            Stopped MJ when she found out she was pregnant Social History   Socioeconomic History  . Marital status: Single    Spouse name: Not on file  . Number of children: Not on file  . Years of education: Not on file  . Highest education level: Not on file  Occupational History  .  Not on file  Social Needs  . Financial resource strain: Not on file  . Food insecurity:    Worry: Not on file    Inability: Not on file  . Transportation needs:    Medical: Not on file    Non-medical: Not on file  Tobacco Use  . Smoking status: Current Every Day Smoker    Packs/day: 0.25    Types: Cigarettes  . Smokeless tobacco: Never Used  . Tobacco comment: 1 PACK A DAY - 4 CIGARETTES A DAY   Substance and Sexual Activity  . Alcohol use: No  . Drug use: Not Currently    Types: Marijuana    Comment: UP UNTIL +UPT  . Sexual activity: Yes    Birth control/protection: None  Lifestyle  . Physical activity:    Days per week: Not on file    Minutes per session: Not on file  . Stress: Not on file  Relationships  . Social connections:    Talks  on phone: Not on file    Gets together: Not on file    Attends religious service: Not on file    Active member of club or organization: Not on file    Attends meetings of clubs or organizations: Not on file    Relationship status: Not on file  Other Topics Concern  . Not on file  Social History Narrative  . Not on file   Family History  Problem Relation Age of Onset  . Diabetes Mother   . COPD Mother   . Diabetes Maternal Aunt   . Cancer Maternal Aunt        breast  . Diabetes Maternal Grandmother   . Cancer Maternal Grandmother        skin cancer  . Alcoholism Unknown   . Arthritis Unknown   . Asthma Unknown   . Depression Unknown   . Hearing loss Neg Hx     Current Outpatient Medications:  .  acetaminophen (TYLENOL) 325 MG tablet, Take 650 mg by mouth daily as needed for pain., Disp: , Rfl:  .  calcium carbonate (TUMS EX) 750 MG chewable tablet, Chew 1 tablet by mouth at bedtime as needed (for nausea)., Disp: , Rfl:  .  omeprazole (PRILOSEC) 20 MG capsule, Take 1 capsule (20 mg total) by mouth daily., Disp: 60 capsule, Rfl: 5 .  Prenatal Vit-Fe Fumarate-FA (PRENATAL PLUS PO), Take 1 tablet by mouth daily., Disp: , Rfl:    .  promethazine (PHENERGAN) 12.5 MG tablet, Take 1 tablet (12.5 mg total) by mouth every 6 (six) hours as needed for nausea or vomiting., Disp: 30 tablet, Rfl: 0  Allergies  Allergen Reactions  . Milk-Related Compounds Anaphylaxis  . Latex Hives  . Penicillins Hives    Has patient had a PCN reaction causing immediate rash, facial/tongue/throat swelling, SOB or lightheadedness with hypotension: Yes Has patient had a PCN reaction causing severe rash involving mucus membranes or skin necrosis: No Has patient had a PCN reaction that required hospitalization: No Has patient had a PCN reaction occurring within the last 10 years: No If all of the above answers are "NO", then may proceed with Cephalosporin use.   . Reglan [Metoclopramide] Other (See Comments)    Panic attack    Review of Systems: Negative except for what is mentioned in HPI.  Objective:   Vitals:   11/06/17 1411  BP: 111/69  Pulse: 97  Weight: 196 lb 9.6 oz (89.2 kg)    Fetal Status: Fetal Heart Rate (bpm): 159   Movement: Present     Physical Exam: BP 111/69   Pulse 97   Wt 196 lb 9.6 oz (89.2 kg)   LMP 08/25/2017   BMI 32.22 kg/m  CONSTITUTIONAL: Well-developed, well-nourished female in no acute distress.  NEUROLOGIC: Alert and oriented to person, place, and time. Normal reflexes, muscle tone coordination. No cranial nerve deficit noted. PSYCHIATRIC: Normal mood and affect. Normal behavior. Normal judgment and thought content. SKIN: Skin is warm and dry. No rash noted. Not diaphoretic. No erythema. No pallor. HENT:  Normocephalic, atraumatic, External right and left ear normal. Oropharynx is clear and moist EYES: Conjunctivae and EOM are normal. Pupils are equal, round, and reactive to light. No scleral icterus.  NECK: Normal range of motion, supple, no masses CARDIOVASCULAR: Normal heart rate noted, regular rhythm RESPIRATORY: Effort and breath sounds normal, no problems with respiration noted BREASTS:  symmetric, non-tender, no masses palpable ABDOMEN: Soft, nontender, nondistended, gravid. GU: normal appearing external female genitalia,  multiparous normal appearing cervix, scant white discharge in vagina, no lesions noted Bimanual: 10 weeks sized uterus, moderate uterine tenderness, particularly on left, no adnexal palpable lesions noted MUSCULOSKELETAL: Normal range of motion. EXT:  No edema and no tenderness. 2+ distal pulses.   Assessment and Plan:  Pregnancy: G5P0131 at [redacted]w[redacted]d by LMP  1. Supervision of high risk pregnancy, antepartum - Obstetric Panel, Including HIV - Culture, OB Urine - Cytology - PAP - Hemoglobinopathy evaluation - Cystic Fibrosis Mutation 97 - SMN1 COPY NUMBER ANALYSIS (SMA Carrier Screen) - Genetic Screening - Cervicovaginal ancillary only - Enroll Patient in Babyscripts - US OB Transvaginal; Future  2. Smoking Smoking and tobacco cessation was discussed at today's visit for 4 minutes    Preterm labor symptoms and general obstetric precautions including but not limited to vaginal bleeding, contractions, leaking of fluid and fetal movement were reviewed in detail with the patient.  Please refer to After Visit Summary for other counseling recommendations.   Return in about 4 weeks (around 12/04/2017) for OB visit.  Conan Bowens 11/06/2017 4:47 PM

## 2017-11-07 LAB — CYTOLOGY - PAP: Diagnosis: NEGATIVE

## 2017-11-07 LAB — CERVICOVAGINAL ANCILLARY ONLY
Chlamydia: NEGATIVE
Neisseria Gonorrhea: NEGATIVE
Trichomonas: NEGATIVE

## 2017-11-08 LAB — URINE CULTURE, OB REFLEX

## 2017-11-08 LAB — CULTURE, OB URINE

## 2017-11-11 LAB — OBSTETRIC PANEL, INCLUDING HIV
Antibody Screen: NEGATIVE
BASOS ABS: 0 10*3/uL (ref 0.0–0.2)
Basos: 0 %
EOS (ABSOLUTE): 0.1 10*3/uL (ref 0.0–0.4)
Eos: 1 %
HIV SCREEN 4TH GENERATION: NONREACTIVE
Hematocrit: 38.5 % (ref 34.0–46.6)
Hemoglobin: 12.9 g/dL (ref 11.1–15.9)
Hepatitis B Surface Ag: NEGATIVE
Immature Grans (Abs): 0 10*3/uL (ref 0.0–0.1)
Immature Granulocytes: 0 %
LYMPHS: 17 %
Lymphocytes Absolute: 1.7 10*3/uL (ref 0.7–3.1)
MCH: 28.9 pg (ref 26.6–33.0)
MCHC: 33.5 g/dL (ref 31.5–35.7)
MCV: 86 fL (ref 79–97)
MONOCYTES: 5 %
Monocytes Absolute: 0.5 10*3/uL (ref 0.1–0.9)
NEUTROS ABS: 7.5 10*3/uL — AB (ref 1.4–7.0)
Neutrophils: 77 %
Platelets: 241 10*3/uL (ref 150–450)
RBC: 4.46 x10E6/uL (ref 3.77–5.28)
RDW: 13.8 % (ref 12.3–15.4)
RPR Ser Ql: NONREACTIVE
RUBELLA: 10.6 {index} (ref >0.99)
Rh Factor: POSITIVE
WBC: 9.8 10*3/uL (ref 3.4–10.8)

## 2017-11-11 LAB — HEMOGLOBINOPATHY EVALUATION
HEMOGLOBIN A2 QUANTITATION: 2 % (ref 1.8–3.2)
HEMOGLOBIN F QUANTITATION: 0 % (ref 0.0–2.0)
HGB A: 98 % (ref 96.4–98.8)
HGB C: 0 %
HGB S: 0 %
HGB VARIANT: 0 %

## 2017-11-12 ENCOUNTER — Encounter: Payer: Self-pay | Admitting: Obstetrics and Gynecology

## 2017-11-13 ENCOUNTER — Ambulatory Visit (HOSPITAL_COMMUNITY)
Admission: RE | Admit: 2017-11-13 | Discharge: 2017-11-13 | Disposition: A | Discharge status: Home / Self Care | Payer: Medicaid Other | Source: Ambulatory Visit | Attending: Obstetrics and Gynecology | Admitting: Obstetrics and Gynecology

## 2017-11-13 DIAGNOSIS — Z3A11 11 weeks gestation of pregnancy: Secondary | ICD-10-CM | POA: Diagnosis not present

## 2017-11-13 DIAGNOSIS — O0990 Supervision of high risk pregnancy, unspecified, unspecified trimester: Secondary | ICD-10-CM

## 2017-11-13 DIAGNOSIS — O0991 Supervision of high risk pregnancy, unspecified, first trimester: Secondary | ICD-10-CM | POA: Insufficient documentation

## 2017-11-14 LAB — SMN1 COPY NUMBER ANALYSIS (SMA CARRIER SCREENING)

## 2017-11-17 LAB — CYSTIC FIBROSIS MUTATION 97: GENE DIS ANAL CARRIER INTERP BLD/T-IMP: NOT DETECTED

## 2017-11-25 ENCOUNTER — Telehealth: Payer: Self-pay

## 2017-11-25 NOTE — Telephone Encounter (Signed)
Pt called stating she is constipated. Pt states this is normal for her, states that even when she was not pregnant she would go a week at a time without having a bowel movement. Pt states she has had several very small bowel movements this week. I recommended patient try Colace over the counter and let us know if this gives her any relief. Advised pt stay hydrated and drink plenty of water as well. Pt verbalized understanding.

## 2017-12-04 ENCOUNTER — Encounter: Payer: Medicaid Other | Admitting: Obstetrics and Gynecology

## 2017-12-10 ENCOUNTER — Ambulatory Visit (INDEPENDENT_AMBULATORY_CARE_PROVIDER_SITE_OTHER): Payer: Medicaid Other | Admitting: Certified Nurse Midwife

## 2017-12-10 ENCOUNTER — Encounter: Payer: Self-pay | Admitting: Certified Nurse Midwife

## 2017-12-10 DIAGNOSIS — Z8659 Personal history of other mental and behavioral disorders: Secondary | ICD-10-CM | POA: Insufficient documentation

## 2017-12-10 DIAGNOSIS — F319 Bipolar disorder, unspecified: Secondary | ICD-10-CM | POA: Insufficient documentation

## 2017-12-10 DIAGNOSIS — O09212 Supervision of pregnancy with history of pre-term labor, second trimester: Secondary | ICD-10-CM

## 2017-12-10 DIAGNOSIS — O09219 Supervision of pregnancy with history of pre-term labor, unspecified trimester: Secondary | ICD-10-CM

## 2017-12-10 DIAGNOSIS — O09899 Supervision of other high risk pregnancies, unspecified trimester: Secondary | ICD-10-CM | POA: Insufficient documentation

## 2017-12-10 DIAGNOSIS — O0992 Supervision of high risk pregnancy, unspecified, second trimester: Secondary | ICD-10-CM

## 2017-12-10 DIAGNOSIS — O0990 Supervision of high risk pregnancy, unspecified, unspecified trimester: Secondary | ICD-10-CM

## 2017-12-10 DIAGNOSIS — Z8751 Personal history of pre-term labor: Secondary | ICD-10-CM | POA: Insufficient documentation

## 2017-12-10 MED ORDER — VITAFOL GUMMIES 3.33-0.333-34.8 MG PO CHEW: 3.0000 | CHEWABLE_TABLET | Freq: Every day | ORAL | 2 refills | Status: DC

## 2017-12-10 MED ORDER — SERTRALINE HCL 50 MG PO TABS: 50.0000 mg | ORAL_TABLET | Freq: Every day | ORAL | 2 refills | Status: DC

## 2017-12-10 NOTE — Progress Notes (Signed)
duplicate

## 2017-12-10 NOTE — Patient Instructions (Signed)
Preventing Preterm Birth °Preterm birth is when your baby is delivered between 20 weeks and 37 weeks of pregnancy. A full-term pregnancy lasts for at least 37 weeks. Preterm birth can be dangerous for your baby because the last few weeks of pregnancy are an important time for your baby's brain and lungs to grow. Many things can cause a baby to be born early. Sometimes the cause is not known. There are certain factors that make you more likely to experience preterm birth, such as: °· Having a previous baby born preterm. °· Being pregnant with twins or other multiples. °· Having had fertility treatment. °· Being overweight or underweight at the start of your pregnancy. °· Having any of the following during pregnancy: °? An infection, including a urinary tract infection (UTI) or an STI (sexually transmitted infection). °? High blood pressure. °? Diabetes. °? Vaginal bleeding. °· Being age 35 or older. °· Being age 18 or younger. °· Getting pregnant within 6 months of a previous pregnancy. °· Suffering extreme stress or physical or emotional abuse during pregnancy. °· Standing for long periods of time during pregnancy, such as working at a job that requires standing. ° °What are the risks? °The most serious risk of preterm birth is that the baby may not survive. This is more likely to happen if a baby is born before 34 weeks. Other risks and complications of preterm birth may include your baby having: °· Breathing problems. °· Brain damage that affects movement and coordination (cerebral palsy). °· Feeding difficulties. °· Vision or hearing problems. °· Infections or inflammation of the digestive tract (colitis). °· Developmental delays. °· Learning disabilities. °· Higher risk for diabetes, heart disease, and high blood pressure later in life. ° °What can I do to lower my risk? °Medical care ° °The most important thing you can do to lower your risk for preterm birth is to get routine medical care during pregnancy  (prenatal care). If you have a high risk of preterm birth, you may be referred to a health care provider who specializes in managing high-risk pregnancies (perinatologist). You may be given medicine to help prevent preterm birth. °Lifestyle changes °Certain lifestyle changes can also lower your risk of preterm birth: °· Wait at least 6 months after a pregnancy to become pregnant again. °· Try to plan pregnancy for when you are between 19 and 35 years old. °· Get to a healthy weight before getting pregnant. If you are overweight, work with your health care provider to safely lose weight. °· Do not use any products that contain nicotine or tobacco, such as cigarettes and e-cigarettes. If you need help quitting, ask your health care provider. °· Do not drink alcohol. °· Do not use drugs. ° °Where to find support: °For more support, consider: °· Talking with your health care provider. °· Talking with a therapist or substance abuse counselor, if you need help quitting. °· Working with a diet and nutrition specialist (dietitian) or a personal trainer to maintain a healthy weight. °· Joining a support group. ° °Where to find more information: °Learn more about preventing preterm birth from: °· Centers for Disease Control and Prevention: cdc.gov/reproductivehealth/maternalinfanthealth/pretermbirth.htm °· March of Dimes: marchofdimes.org/complications/premature-babies.aspx °· American Pregnancy Association: americanpregnancy.org/labor-and-birth/premature-labor ° °Contact a health care provider if: °· You have any of the following signs of preterm labor before 37 weeks: °? A change or increase in vaginal discharge. °? Fluid leaking from your vagina. °? Pressure or cramps in your lower abdomen. °? A backache that does not   go away or gets worse. °? Regular tightening (contractions) in your lower abdomen. °Summary °· Preterm birth means having your baby during weeks 20-37 of pregnancy. °· Preterm birth may put your baby at risk  for physical and mental problems. °· Getting good prenatal care can help prevent preterm birth. °· You can lower your risk of preterm birth by making certain lifestyle changes, such as not smoking and not using alcohol. °This information is not intended to replace advice given to you by your health care provider. Make sure you discuss any questions you have with your health care provider. °Document Released: 02/28/2015 Document Revised: 09/23/2015 Document Reviewed: 09/23/2015 °Elsevier Interactive Patient Education © 2018 [employer] ° °

## 2017-12-10 NOTE — Progress Notes (Signed)
Needs refill on PNV's. Makena form faxed .

## 2017-12-10 NOTE — Progress Notes (Signed)
   PRENATAL VISIT NOTE  Subjective:  Taylor Buck is a 27 y.o. [phone] at 6420w2d being seen today for ongoing prenatal care.  She is currently monitored for the following issues for this high-risk pregnancy and has Supervision of high risk pregnancy, antepartum; History of preterm delivery, currently pregnant; Bipolar 1 disorder (HCC); and History of depression on their problem list.  Patient reports symtoms of depression.  Contractions: Not present. Vag. Bleeding: None.  Movement: Present. Denies leaking of fluid.   The following portions of the patient's history were reviewed and updated as appropriate: allergies, current medications, past family history, past medical history, past social history, past surgical history and problem list. Problem list updated.  Objective:   Vitals:   12/10/17 1500  BP: 101/67  Pulse: 66  Weight: 197 lb (89.4 kg)    Fetal Status: Fetal Heart Rate (bpm): 152   Movement: Present     General:  Alert, oriented and cooperative. Patient is in no acute distress.  Skin: Skin is warm and dry. No rash noted.   Cardiovascular: Normal heart rate noted  Respiratory: Normal respiratory effort, no problems with respiration noted  Abdomen: Soft, gravid, appropriate for gestational age.  Pain/Pressure: Present     Pelvic: Cervical exam deferred        Extremities: Normal range of motion.  Edema: Trace  Mental Status: Normal mood and affect. Normal behavior. Normal judgment and thought content.   Assessment and Plan:  Pregnancy: [id number] at 520w2d  1. Supervision of high risk pregnancy, antepartum - Patient doing well, reports occasional symptoms of depression where she wakes up and does not want to get out of bed, denies SI. Has not been on medication for depression but does not want it to continue getting worse- request medication  - Anticipatory guidance on upcoming appointments  - Request refill of prenatal vitamins, prefers gummy   - AFP, Serum,  Open Spina Bifida - US MFM OB DETAIL +14 WK; Future - Prenatal Vit-Fe Phos-FA-Omega (VITAFOL GUMMIES) 3.33-0.333-34.8 MG CHEW; Chew 3 tablets by mouth daily.  Dispense: 90 tablet; Refill: 2  2. History of preterm delivery, currently pregnant - Educated and discussed 17P injections with patient, answered patient's questions - She agrees to starting 17P and will begin as scheduled at 16 weeks.  - US MFM OB DETAIL +14 WK; Future  3. Bipolar 1 disorder (HCC) - Hx of bipolar disorder where she was on medication in past, has not been on medication in "years", knows when she is manic and needs to get help   4. History of depression - Started on Zoloft for hx of depression and current depression symptoms  - sertraline (ZOLOFT) 50 MG tablet; Take 1 tablet (50 mg total) by mouth daily.  Dispense: 60 tablet; Refill: 2  Preterm labor symptoms and general obstetric precautions including but not limited to vaginal bleeding, contractions, leaking of fluid and fetal movement were reviewed in detail with the patient. Please refer to After Visit Summary for other counseling recommendations.  Return in about 4 weeks (around 01/07/2018) for ROB.  Future Appointments  Date Time Provider Department Center  12/17/2017 11:30 AM [facility] NURSE [facility] None  12/24/2017 10:30 AM [facility] NURSE [facility] None  12/31/2017 10:30 AM [facility] NURSE [facility] None  01/05/2018 11:15 AM [facility] US 4 [facility]  01/07/2018  9:30 AM Sharyon Cableogers, Marguerette Sheller C, CNM [facility] None    Sharyon CableVeronica C Santa Abdelrahman, CNM

## 2017-12-12 LAB — AFP, SERUM, OPEN SPINA BIFIDA
AFP MoM: 1.6
AFP Value: 40.6 ng/mL
Gest. Age on Collection Date: 15.3 weeks
Maternal Age At EDD: 28.2 yr
OSBR Risk 1 IN: 2100
Test Results:: NEGATIVE
Weight: 197 [lb_av]

## 2017-12-17 ENCOUNTER — Ambulatory Visit (INDEPENDENT_AMBULATORY_CARE_PROVIDER_SITE_OTHER): Payer: Medicaid Other

## 2017-12-17 DIAGNOSIS — O09219 Supervision of pregnancy with history of pre-term labor, unspecified trimester: Principal | ICD-10-CM

## 2017-12-17 DIAGNOSIS — O09212 Supervision of pregnancy with history of pre-term labor, second trimester: Secondary | ICD-10-CM

## 2017-12-17 DIAGNOSIS — O09899 Supervision of other high risk pregnancies, unspecified trimester: Secondary | ICD-10-CM

## 2017-12-17 MED ORDER — HYDROXYPROGESTERONE CAPROATE 275 MG/1.1ML ~~LOC~~ SOAJ
275.0000 mg | Freq: Once | SUBCUTANEOUS | Status: AC
  Administered 2017-12-17: 275 mg via SUBCUTANEOUS

## 2017-12-17 NOTE — Progress Notes (Signed)
Pt presents for 17p. Pt given inj in right arm. Pt tolerated well.

## 2017-12-24 ENCOUNTER — Ambulatory Visit (INDEPENDENT_AMBULATORY_CARE_PROVIDER_SITE_OTHER): Payer: Medicaid Other

## 2017-12-24 ENCOUNTER — Other Ambulatory Visit: Payer: Self-pay

## 2017-12-24 ENCOUNTER — Inpatient Hospital Stay (HOSPITAL_COMMUNITY)
Admission: AD | Admit: 2017-12-24 | Discharge: 2017-12-24 | Disposition: A | Discharge status: Home / Self Care | Payer: Medicaid Other | Source: Ambulatory Visit | Attending: Family Medicine | Admitting: Family Medicine

## 2017-12-24 ENCOUNTER — Encounter (HOSPITAL_COMMUNITY): Payer: Self-pay

## 2017-12-24 VITALS — BP 126/70 | HR 102 | Wt 196.0 lb

## 2017-12-24 DIAGNOSIS — Z833 Family history of diabetes mellitus: Secondary | ICD-10-CM | POA: Insufficient documentation

## 2017-12-24 DIAGNOSIS — O209 Hemorrhage in early pregnancy, unspecified: Secondary | ICD-10-CM | POA: Diagnosis not present

## 2017-12-24 DIAGNOSIS — F329 Major depressive disorder, single episode, unspecified: Secondary | ICD-10-CM | POA: Diagnosis not present

## 2017-12-24 DIAGNOSIS — Z91011 Allergy to milk products: Secondary | ICD-10-CM | POA: Insufficient documentation

## 2017-12-24 DIAGNOSIS — O99332 Smoking (tobacco) complicating pregnancy, second trimester: Secondary | ICD-10-CM | POA: Diagnosis not present

## 2017-12-24 DIAGNOSIS — Z3A17 17 weeks gestation of pregnancy: Secondary | ICD-10-CM | POA: Diagnosis not present

## 2017-12-24 DIAGNOSIS — O09212 Supervision of pregnancy with history of pre-term labor, second trimester: Secondary | ICD-10-CM | POA: Diagnosis not present

## 2017-12-24 DIAGNOSIS — Z91040 Latex allergy status: Secondary | ICD-10-CM | POA: Diagnosis not present

## 2017-12-24 DIAGNOSIS — Z880 Allergy status to penicillin: Secondary | ICD-10-CM | POA: Insufficient documentation

## 2017-12-24 DIAGNOSIS — Z79899 Other long term (current) drug therapy: Secondary | ICD-10-CM | POA: Diagnosis not present

## 2017-12-24 DIAGNOSIS — O26892 Other specified pregnancy related conditions, second trimester: Secondary | ICD-10-CM

## 2017-12-24 DIAGNOSIS — O09899 Supervision of other high risk pregnancies, unspecified trimester: Secondary | ICD-10-CM

## 2017-12-24 DIAGNOSIS — O99342 Other mental disorders complicating pregnancy, second trimester: Secondary | ICD-10-CM | POA: Insufficient documentation

## 2017-12-24 DIAGNOSIS — Z888 Allergy status to other drugs, medicaments and biological substances status: Secondary | ICD-10-CM | POA: Insufficient documentation

## 2017-12-24 DIAGNOSIS — O09219 Supervision of pregnancy with history of pre-term labor, unspecified trimester: Principal | ICD-10-CM

## 2017-12-24 DIAGNOSIS — O98512 Other viral diseases complicating pregnancy, second trimester: Secondary | ICD-10-CM | POA: Insufficient documentation

## 2017-12-24 DIAGNOSIS — Z9889 Other specified postprocedural states: Secondary | ICD-10-CM | POA: Insufficient documentation

## 2017-12-24 DIAGNOSIS — M549 Dorsalgia, unspecified: Secondary | ICD-10-CM | POA: Diagnosis present

## 2017-12-24 DIAGNOSIS — A084 Viral intestinal infection, unspecified: Secondary | ICD-10-CM

## 2017-12-24 LAB — URINALYSIS, ROUTINE W REFLEX MICROSCOPIC
BILIRUBIN URINE: NEGATIVE
Glucose, UA: NEGATIVE mg/dL
HGB URINE DIPSTICK: NEGATIVE
KETONES UR: NEGATIVE mg/dL
NITRITE: NEGATIVE
PH: 5 (ref 5.0–8.0)
Protein, ur: NEGATIVE mg/dL
SPECIFIC GRAVITY, URINE: 1.017 (ref 1.005–1.030)

## 2017-12-24 MED ORDER — ONDANSETRON 8 MG PO TBDP
8.0000 mg | ORAL_TABLET | Freq: Once | ORAL | Status: AC
  Administered 2017-12-24: 8 mg via ORAL
  Filled 2017-12-24: qty 1

## 2017-12-24 MED ORDER — HYDROXYPROGESTERONE CAPROATE 275 MG/1.1ML ~~LOC~~ SOAJ
275.0000 mg | Freq: Once | SUBCUTANEOUS | Status: AC
  Administered 2017-12-24: 275 mg via SUBCUTANEOUS

## 2017-12-24 MED ORDER — ONDANSETRON 8 MG PO TBDP: 8.0000 mg | ORAL_TABLET | Freq: Three times a day (TID) | ORAL | 0 refills | Status: DC | PRN

## 2017-12-24 NOTE — MAU Note (Signed)
Pt states she started having lower back pain and cramping since 3pm today that she rates 5/10.  Pt reports some spotting when using the restroom at 6pm.  Pt also reports diarrheaX4 today and emesis X3 or more.

## 2017-12-24 NOTE — MAU Note (Signed)
Urine in lab 

## 2017-12-24 NOTE — Progress Notes (Signed)
Presented for 17P injection, given in Left Arm, tolerated well. Administrations This Visit    HYDROXYprogesterone Caproate SOAJ 275 mg    Admin Date 12/24/2017 Action Given Dose 275 mg Route Subcutaneous Administered By Maretta BeesMcGlashan, Carol J, RMA

## 2017-12-24 NOTE — MAU Provider Note (Signed)
History     CSN: [id number]  Arrival date and time: 12/24/17 1844   First Provider Initiated Contact with Patient 12/24/17 1933      Chief Complaint  Patient presents with  . Back Pain  . Abdominal Cramping  . Vaginal Bleeding  . Diarrhea   HPI Taylor Buck is a 27 y.o. [phone] at 7033w2d who presents with nausea, vomiting and diarrhea. She states the symptoms started yesterday. She has thrown up 3 times and had diarrhea 4 times today. She also reports lower abdominal cramping that she rates a 4/10 and has not tried anything for. She denies leaking or bleeding. Reports intermittent fetal movement.   OB History    Gravida  6   Para  1   Term  0   Preterm  1   AB  4   Living  1     SAB  4   TAB      Ectopic      Multiple      Live Births  1           Past Medical History:  Diagnosis Date  . Anxiety   . Arthritis   . Asthma   . Depression   . Heart murmur   . Infection    chlamydia age 89  . PCOS (polycystic ovarian syndrome)   . Sleep apnea   . Urinary tract infection     Past Surgical History:  Procedure Laterality Date  . HAND SURGERY     reconstruction- injury due to frostbite as child  . HERNIA REPAIR     rt inguinal    Family History  Problem Relation Age of Onset  . Diabetes Mother   . COPD Mother   . Diabetes Maternal Aunt   . Cancer Maternal Aunt        breast  . Diabetes Maternal Grandmother   . Cancer Maternal Grandmother        skin cancer  . Alcoholism Unknown   . Arthritis Unknown   . Asthma Unknown   . Depression Unknown   . Hearing loss Neg Hx     Social History   Tobacco Use  . Smoking status: Current Every Day Smoker    Packs/day: 0.25    Types: Cigarettes  . Smokeless tobacco: Never Used  . Tobacco comment: 1 PACK A DAY - 4 CIGARETTES A DAY   Substance Use Topics  . Alcohol use: No  . Drug use: Not Currently    Types: Marijuana    Comment: UP UNTIL +UPT    Allergies:  Allergies   Allergen Reactions  . Milk-Related Compounds Anaphylaxis  . Latex Hives  . Penicillins Hives    Has patient had a PCN reaction causing immediate rash, facial/tongue/throat swelling, SOB or lightheadedness with hypotension: Yes Has patient had a PCN reaction causing severe rash involving mucus membranes or skin necrosis: No Has patient had a PCN reaction that required hospitalization: No Has patient had a PCN reaction occurring within the last 10 years: No If all of the above answers are "NO", then may proceed with Cephalosporin use.   . Reglan [Metoclopramide] Other (See Comments)    Panic attack    Medications Prior to Admission  Medication Sig Dispense Refill Last Dose  . acetaminophen (TYLENOL) 325 MG tablet Take 650 mg by mouth daily as needed for pain.   12/24/2017 at 1700  . calcium carbonate (TUMS EX) 750 MG chewable tablet Chew 1 tablet by mouth  at bedtime as needed (for nausea).   12/24/2017 at Unknown time  . omeprazole (PRILOSEC) 20 MG capsule Take 1 capsule (20 mg total) by mouth daily. 60 capsule 5 12/24/2017 at Unknown time  . Prenatal Vit-Fe Phos-FA-Omega (VITAFOL GUMMIES) 3.33-0.333-34.8 MG CHEW Chew 3 tablets by mouth daily. 90 tablet 2 12/23/2017 at Unknown time  . sertraline (ZOLOFT) 50 MG tablet Take 1 tablet (50 mg total) by mouth daily. 60 tablet 2 12/24/2017 at Unknown time  . promethazine (PHENERGAN) 12.5 MG tablet Take 1 tablet (12.5 mg total) by mouth every 6 (six) hours as needed for nausea or vomiting. (Patient not taking: Reported on 12/10/2017) 30 tablet 0 Unknown at Unknown time    Review of Systems  Constitutional: Negative.  Negative for fatigue and fever.  HENT: Negative.   Respiratory: Negative.  Negative for shortness of breath.   Cardiovascular: Negative.  Negative for chest pain.  Gastrointestinal: Positive for abdominal pain, diarrhea, nausea and vomiting. Negative for constipation.  Genitourinary: Negative.  Negative for dysuria, vaginal  bleeding and vaginal discharge.  Neurological: Negative.  Negative for dizziness and headaches.   Physical Exam   Blood pressure (!) 102/59, pulse 66, temperature 98.5 F (36.9 C), temperature source Oral, resp. rate 16, last menstrual period 08/25/2017, SpO2 97 %, unknown if currently breastfeeding.  Physical Exam  Nursing note and vitals reviewed. Constitutional: She is oriented to person, place, and time. She appears well-developed and well-nourished. No distress.  HENT:  Head: Normocephalic.  Eyes: Pupils are equal, round, and reactive to light.  Cardiovascular: Normal rate, regular rhythm and normal heart sounds.  Respiratory: Effort normal and breath sounds normal. No respiratory distress.  GI: Soft. Bowel sounds are normal. She exhibits no distension. There is no tenderness.  Neurological: She is alert and oriented to person, place, and time.  Skin: Skin is warm and dry.  Psychiatric: She has a normal mood and affect. Her behavior is normal. Judgment and thought content normal.   Dilation: Closed Effacement (%): Thick Cervical Position: Posterior Exam by:: Ma Hillock CNM  FHT: 152 bpm  MAU Course  Procedures Results for orders placed or performed during the hospital encounter of 12/24/17 (from the past 24 hour(s))  Urinalysis, Routine w reflex microscopic     Status: Abnormal   Collection Time: 12/24/17  6:56 PM  Result Value Ref Range   Color, Urine YELLOW YELLOW   APPearance HAZY (A) CLEAR   Specific Gravity, Urine 1.017 1.005 - 1.030   pH 5.0 5.0 - 8.0   Glucose, UA NEGATIVE NEGATIVE mg/dL   Hgb urine dipstick NEGATIVE NEGATIVE   Bilirubin Urine NEGATIVE NEGATIVE   Ketones, ur NEGATIVE NEGATIVE mg/dL   Protein, ur NEGATIVE NEGATIVE mg/dL   Nitrite NEGATIVE NEGATIVE   Leukocytes, UA MODERATE (A) NEGATIVE   RBC / HPF 0-5 0 - 5 RBC/hpf   WBC, UA 6-10 0 - 5 WBC/hpf   Bacteria, UA RARE (A) NONE SEEN   Squamous Epithelial / LPF 11-20 0 - 5   MDM UA Zofran 8mg   ODT  Assessment and Plan   1. Viral gastroenteritis   2. [redacted] weeks gestation of pregnancy    -Discharge home in stable condition -Rx for zofran sent to patient's pharmacy -Viral gastroenteritis precautions discussed -Patient advised to follow-up with [facility] as scheduled for prenatal care -Patient may return to MAU as needed or if her condition were to change or worsen  Rolm Bookbinder CNM 12/24/2017, 7:45 PM

## 2017-12-24 NOTE — Discharge Instructions (Signed)
° °Food Choices to Help Relieve Diarrhea, Adult °When you have diarrhea, the foods you eat and your eating habits are very important. Choosing the right foods and drinks can help: °· Relieve diarrhea. °· Replace lost fluids and nutrients. °· Prevent dehydration. ° °What general guidelines should I follow? °Relieving diarrhea °· Choose foods with less than 2 g or .07 oz. of fiber per serving. °· Limit fats to less than 8 tsp (38 g or 1.34 oz.) a day. °· Avoid the following: °? Foods and beverages sweetened with high-fructose corn syrup, honey, or sugar alcohols such as xylitol, sorbitol, and mannitol. °? Foods that contain a lot of fat or sugar. °? Fried, greasy, or spicy foods. °? High-fiber grains, breads, and cereals. °? Raw fruits and vegetables. °· Eat foods that are rich in probiotics. These foods include dairy products such as yogurt and fermented milk products. They help increase healthy bacteria in the stomach and intestines (gastrointestinal tract, or GI tract). °· If you have lactose intolerance, avoid dairy products. These may make your diarrhea worse. °· Take medicine to help stop diarrhea (antidiarrheal medicine) only as told by your health care provider. °Replacing nutrients °· Eat small meals or snacks every 3-4 hours. °· Eat bland foods, such as white rice, toast, or baked potato, until your diarrhea starts to get better. Gradually reintroduce nutrient-rich foods as tolerated or as told by your health care provider. This includes: °? Well-cooked protein foods. °? Peeled, seeded, and soft-cooked fruits and vegetables. °? Low-fat dairy products. °· Take vitamin and mineral supplements as told by your health care provider. °Preventing dehydration ° °· Start by sipping water or a special solution to prevent dehydration (oral rehydration solution, ORS). Urine that is clear or pale yellow means that you are getting enough fluid. °· Try to drink at least 8-10 cups of fluid each day to help replace lost  fluids. °· You may add other liquids in addition to water, such as clear juice or decaffeinated sports drinks, as tolerated or as told by your health care provider. °· Avoid drinks with caffeine, such as coffee, tea, or soft drinks. °· Avoid alcohol. °What foods are recommended? °The items listed may not be a complete list. Talk with your health care provider about what dietary choices are best for you. °Grains °White rice. White, French, or pita breads (fresh or toasted), including plain rolls, buns, or bagels. White pasta. Saltine, soda, or graham crackers. Pretzels. Low-fiber cereal. Cooked cereals made with water (such as cornmeal, farina, or cream cereals). Plain muffins. Matzo. Melba toast. Zwieback. °Vegetables °Potatoes (without the skin). Most well-cooked and canned vegetables without skins or seeds. Tender lettuce. °Fruits °Apple sauce. Fruits canned in juice. Cooked apricots, cherries, grapefruit, peaches, pears, or plums. Fresh bananas and cantaloupe. °Meats and other protein foods °Baked or boiled chicken. Eggs. Tofu. Fish. Seafood. Smooth nut butters. Ground or well-cooked tender beef, ham, veal, lamb, pork, or poultry. °Dairy °Plain yogurt, kefir, and unsweetened liquid yogurt. Lactose-free milk, buttermilk, skim milk, or soy milk. Low-fat or nonfat hard cheese. °Beverages °Water. Low-calorie sports drinks. Fruit juices without pulp. Strained tomato and vegetable juices. Decaffeinated teas. Sugar-free beverages not sweetened with sugar alcohols. Oral rehydration solutions, if approved by your health care provider. °Seasoning and other foods °Bouillon, broth, or soups made from recommended foods. °What foods are not recommended? °The items listed may not be a complete list. Talk with your health care provider about what dietary choices are best for you. °Grains °Whole grain, whole   wheat, bran, or rye breads, rolls, pastas, and crackers. Wild or brown rice. Whole grain or bran cereals. Barley. Oats and  oatmeal. Corn tortillas or taco shells. Granola. Popcorn. °Vegetables °Raw vegetables. Fried vegetables. Cabbage, broccoli, Brussels sprouts, artichokes, baked beans, beet greens, corn, kale, legumes, peas, sweet potatoes, and yams. Potato skins. Cooked spinach and cabbage. °Fruits °Dried fruit, including raisins and dates. Raw fruits. Stewed or dried prunes. Canned fruits with syrup. °Meat and other protein foods °Fried or fatty meats. Deli meats. Chunky nut butters. Nuts and seeds. Beans and lentils. Bacon. Hot dogs. Sausage. °Dairy °High-fat cheeses. Whole milk, chocolate milk, and beverages made with milk, such as milk shakes. Half-and-half. Cream. sour cream. Ice cream. °Beverages °Caffeinated beverages (such as coffee, tea, soda, or energy drinks). Alcoholic beverages. Fruit juices with pulp. Prune juice. Soft drinks sweetened with high-fructose corn syrup or sugar alcohols. High-calorie sports drinks. °Fats and oils °Butter. Cream sauces. Margarine. Salad oils. Plain salad dressings. Olives. Avocados. Mayonnaise. °Sweets and desserts °Sweet rolls, doughnuts, and sweet breads. Sugar-free desserts sweetened with sugar alcohols such as xylitol and sorbitol. °Seasoning and other foods °Honey. Hot sauce. Chili powder. Gravy. Cream-based or milk-based soups. Pancakes and waffles. °Summary °· When you have diarrhea, the foods you eat and your eating habits are very important. °· Make sure you get at least 8-10 cups of fluid each day, or enough to keep your urine clear or pale yellow. °· Eat bland foods and gradually reintroduce healthy, nutrient-rich foods as tolerated, or as told by your health care provider. °· Avoid high-fiber, fried, greasy, or spicy foods. °This information is not intended to replace advice given to you by your health care provider. Make sure you discuss any questions you have with your health care provider. °Document Released: 04/06/2003 Document Revised: 01/12/2016 Document Reviewed:  01/12/2016 °Elsevier Interactive Patient Education © 2018 [employer] °Viral Gastroenteritis, Adult °Viral gastroenteritis is also known as the stomach flu. This condition is caused by certain germs (viruses). These germs can be passed from person to person very easily (are very contagious). This condition can cause sudden watery poop (diarrhea), fever, and throwing up (vomiting). °Having watery poop and throwing up can make you feel weak and cause you to get dehydrated. Dehydration can make you tired and thirsty, make you have a dry mouth, and make it so you pee (urinate) less often. Older adults and people with other diseases or a weak defense system (immune system) are at higher risk for dehydration. It is important to replace the fluids that you lose from having watery poop and throwing up. °Follow these instructions at home: °Follow instructions from your doctor about how to care for yourself at home. °Eating and drinking ° °Follow these instructions as told by your doctor: °· Take an oral rehydration solution (ORS). This is a drink that is sold at pharmacies and stores. °· Drink clear fluids in small amounts as you are able, such as: °? Water. °? Ice chips. °? Diluted fruit juice. °? Low-calorie sports drinks. °· Eat bland, easy-to-digest foods in small amounts as you are able, such as: °? Bananas. °? Applesauce. °? Rice. °? Low-fat (lean) meats. °? Toast. °? Crackers. °· Avoid fluids that have a lot of sugar or caffeine in them. °· Avoid alcohol. °· Avoid spicy or fatty foods. ° °General instructions °· Drink enough fluid to keep your pee (urine) clear or pale yellow. °· Wash your hands often. If you cannot use soap and water, use hand sanitizer. °· Make   sure that all people in your home wash their hands well and often. °· Rest at home while you get better. °· Take over-the-counter and prescription medicines only as told by your doctor. °· Watch your condition for any changes. °· Take a warm bath to help  with any burning or pain from having watery poop. °· Keep all follow-up visits as told by your doctor. This is important. °Contact a doctor if: °· You cannot keep fluids down. °· Your symptoms get worse. °· You have new symptoms. °· You feel light-headed or dizzy. °· You have muscle cramps. °Get help right away if: °· You have chest pain. °· You feel very weak or you pass out (faint). °· You see blood in your throw-up. °· Your throw-up looks like coffee grounds. °· You have bloody or black poop (stools) or poop that look like tar. °· You have a very bad headache, a stiff neck, or both. °· You have a rash. °· You have very bad pain, cramping, or bloating in your belly (abdomen). °· You have trouble breathing. °· You are breathing very quickly. °· Your heart is beating very quickly. °· Your skin feels cold and clammy. °· You feel confused. °· You have pain when you pee. °· You have signs of dehydration, such as: °? Dark pee, hardly any pee, or no pee. °? Cracked lips. °? Dry mouth. °? Sunken eyes. °? Sleepiness. °? Weakness. °This information is not intended to replace advice given to you by your health care provider. Make sure you discuss any questions you have with your health care provider. °Document Released: 07/03/2007 Document Revised: 08/04/2015 Document Reviewed: 09/20/2014 °Elsevier Interactive Patient Education © 2017 Elsevier Inc. ° °

## 2017-12-26 LAB — CULTURE, OB URINE: Culture: <10000 — AB

## 2017-12-31 ENCOUNTER — Encounter: Payer: Self-pay | Admitting: Obstetrics

## 2017-12-31 ENCOUNTER — Ambulatory Visit (INDEPENDENT_AMBULATORY_CARE_PROVIDER_SITE_OTHER): Payer: Medicaid Other

## 2017-12-31 DIAGNOSIS — O09899 Supervision of other high risk pregnancies, unspecified trimester: Secondary | ICD-10-CM

## 2017-12-31 DIAGNOSIS — O09219 Supervision of pregnancy with history of pre-term labor, unspecified trimester: Principal | ICD-10-CM

## 2017-12-31 DIAGNOSIS — O09212 Supervision of pregnancy with history of pre-term labor, second trimester: Secondary | ICD-10-CM | POA: Diagnosis not present

## 2017-12-31 MED ORDER — HYDROXYPROGESTERONE CAPROATE 275 MG/1.1ML ~~LOC~~ SOAJ
275.0000 mg | SUBCUTANEOUS | Status: DC
  Administered 2017-12-31 – 2018-05-07 (×18): 275 mg via SUBCUTANEOUS

## 2017-12-31 MED ORDER — HYDROXYPROGESTERONE CAPROATE 275 MG/1.1ML ~~LOC~~ SOAJ: 275.0000 mg | SUBCUTANEOUS | Status: DC

## 2017-12-31 NOTE — Progress Notes (Signed)
I have reviewed the chart and agree with nursing staff's documentation of this patient's encounter.  Catalina AntiguaPeggy Garnette Greb, MD 12/31/2017 12:41 PM

## 2017-12-31 NOTE — Progress Notes (Signed)
Nurse visit for 17p given L arm w/o complaints.

## 2018-01-02 ENCOUNTER — Encounter (HOSPITAL_COMMUNITY): Payer: Self-pay

## 2018-01-05 ENCOUNTER — Encounter (HOSPITAL_COMMUNITY): Payer: Self-pay

## 2018-01-05 ENCOUNTER — Other Ambulatory Visit (HOSPITAL_COMMUNITY): Payer: Self-pay | Admitting: *Deleted

## 2018-01-05 ENCOUNTER — Ambulatory Visit (HOSPITAL_COMMUNITY)
Admission: RE | Admit: 2018-01-05 | Discharge: 2018-01-05 | Disposition: A | Discharge status: Home / Self Care | Payer: Medicaid Other | Source: Ambulatory Visit | Attending: Certified Nurse Midwife | Admitting: Certified Nurse Midwife

## 2018-01-05 DIAGNOSIS — O0992 Supervision of high risk pregnancy, unspecified, second trimester: Secondary | ICD-10-CM | POA: Diagnosis present

## 2018-01-05 DIAGNOSIS — Z362 Encounter for other antenatal screening follow-up: Secondary | ICD-10-CM

## 2018-01-05 DIAGNOSIS — O99212 Obesity complicating pregnancy, second trimester: Secondary | ICD-10-CM

## 2018-01-05 DIAGNOSIS — O09212 Supervision of pregnancy with history of pre-term labor, second trimester: Secondary | ICD-10-CM

## 2018-01-05 DIAGNOSIS — O09899 Supervision of other high risk pregnancies, unspecified trimester: Secondary | ICD-10-CM

## 2018-01-05 DIAGNOSIS — O0990 Supervision of high risk pregnancy, unspecified, unspecified trimester: Secondary | ICD-10-CM

## 2018-01-05 DIAGNOSIS — Z3A19 19 weeks gestation of pregnancy: Secondary | ICD-10-CM

## 2018-01-05 DIAGNOSIS — O09219 Supervision of pregnancy with history of pre-term labor, unspecified trimester: Secondary | ICD-10-CM

## 2018-01-05 DIAGNOSIS — Z363 Encounter for antenatal screening for malformations: Secondary | ICD-10-CM | POA: Diagnosis not present

## 2018-01-07 ENCOUNTER — Encounter: Payer: Self-pay | Admitting: Certified Nurse Midwife

## 2018-01-07 ENCOUNTER — Ambulatory Visit (INDEPENDENT_AMBULATORY_CARE_PROVIDER_SITE_OTHER): Payer: Medicaid Other | Admitting: Certified Nurse Midwife

## 2018-01-07 VITALS — BP 99/64 | HR 69 | Wt 194.8 lb

## 2018-01-07 DIAGNOSIS — O09212 Supervision of pregnancy with history of pre-term labor, second trimester: Secondary | ICD-10-CM | POA: Diagnosis not present

## 2018-01-07 DIAGNOSIS — O09899 Supervision of other high risk pregnancies, unspecified trimester: Secondary | ICD-10-CM

## 2018-01-07 DIAGNOSIS — O0992 Supervision of high risk pregnancy, unspecified, second trimester: Secondary | ICD-10-CM

## 2018-01-07 DIAGNOSIS — Z8659 Personal history of other mental and behavioral disorders: Secondary | ICD-10-CM

## 2018-01-07 DIAGNOSIS — O0990 Supervision of high risk pregnancy, unspecified, unspecified trimester: Secondary | ICD-10-CM

## 2018-01-07 DIAGNOSIS — O09219 Supervision of pregnancy with history of pre-term labor, unspecified trimester: Secondary | ICD-10-CM

## 2018-01-07 MED ORDER — SERTRALINE HCL 100 MG PO TABS: 100.0000 mg | ORAL_TABLET | Freq: Every day | ORAL | 0 refills | Status: DC

## 2018-01-07 NOTE — Progress Notes (Signed)
Pt presents for ROB c/o depression is worsening. She is having 3 - 5 bad days weekly. Denies homicidal/sucidal thoughts. She is taking antidepressants regularly.

## 2018-01-07 NOTE — Patient Instructions (Signed)
Second Trimester of Pregnancy The second trimester is from week 13 through week 28, month 4 through 6. This is often the time in pregnancy that you feel your best. Often times, morning sickness has lessened or quit. You may have more energy, and you may get hungry more often. Your unborn baby (fetus) is growing rapidly. At the end of the sixth month, he or she is about 9 inches long and weighs about 1 pounds. You will likely feel the baby move (quickening) between 18 and 20 weeks of pregnancy. Follow these instructions at home:  Avoid all smoking, herbs, and alcohol. Avoid drugs not approved by your doctor.  Do not use any tobacco products, including cigarettes, chewing tobacco, and electronic cigarettes. If you need help quitting, ask your doctor. You may get counseling or other support to help you quit.  Only take medicine as told by your doctor. Some medicines are safe and some are not during pregnancy.  Exercise only as told by your doctor. Stop exercising if you start having cramps.  Eat regular, healthy meals.  Wear a good support bra if your breasts are tender.  Do not use hot tubs, steam rooms, or saunas.  Wear your seat belt when driving.  Avoid raw meat, uncooked cheese, and liter boxes and soil used by cats.  Take your prenatal vitamins.  Take 1500-2000 milligrams of calcium daily starting at the 20th week of pregnancy until you deliver your baby.  Try taking medicine that helps you poop (stool softener) as needed, and if your doctor approves. Eat more fiber by eating fresh fruit, vegetables, and whole grains. Drink enough fluids to keep your pee (urine) clear or pale yellow.  Take warm water baths (sitz baths) to soothe pain or discomfort caused by hemorrhoids. Use hemorrhoid cream if your doctor approves.  If you have puffy, bulging veins (varicose veins), wear support hose. Raise (elevate) your feet for 15 minutes, 3-4 times a day. Limit salt in your diet.  Avoid heavy  lifting, wear low heals, and sit up straight.  Rest with your legs raised if you have leg cramps or low back pain.  Visit your dentist if you have not gone during your pregnancy. Use a soft toothbrush to brush your teeth. Be gentle when you floss.  You can have sex (intercourse) unless your doctor tells you not to.  Go to your doctor visits. Get help if:  You feel dizzy.  You have mild cramps or pressure in your lower belly (abdomen).  You have a nagging pain in your belly area.  You continue to feel sick to your stomach (nauseous), throw up (vomit), or have watery poop (diarrhea).  You have bad smelling fluid coming from your vagina.  You have pain with peeing (urination). Get help right away if:  You have a fever.  You are leaking fluid from your vagina.  You have spotting or bleeding from your vagina.  You have severe belly cramping or pain.  You lose or gain weight rapidly.  You have trouble catching your breath and have chest pain.  You notice sudden or extreme puffiness (swelling) of your face, hands, ankles, feet, or legs.  You have not felt the baby move in over an hour.  You have severe headaches that do not go away with medicine.  You have vision changes. This information is not intended to replace advice given to you by your health care provider. Make sure you discuss any questions you have with your health care   provider. Document Released: 04/10/2009 Document Revised: 06/22/2015 Document Reviewed: 03/17/2012 Elsevier Interactive Patient Education  2017 Elsevier Inc.  

## 2018-01-07 NOTE — Progress Notes (Signed)
   PRENATAL VISIT NOTE  Subjective:  Taylor Buck is a 27 y.o. [id number] at 480w2d being seen today for ongoing prenatal care.  She is currently monitored for the following issues for this high-risk pregnancy and has Supervision of high risk pregnancy, antepartum; History of preterm delivery, currently pregnant; Bipolar 1 disorder (HCC); and History of depression on their problem list.  Patient reports continued depression.  Contractions: Not present. Vag. Bleeding: None.  Movement: Present. Denies leaking of fluid.   The following portions of the patient's history were reviewed and updated as appropriate: allergies, current medications, past family history, past medical history, past social history, past surgical history and problem list. Problem list updated.  Objective:   Vitals:   01/07/18 0928  BP: 99/64  Pulse: 69  Weight: 194 lb 12.8 oz (88.4 kg)    Fetal Status: Fetal Heart Rate (bpm): 150   Movement: Present     General:  Alert, oriented and cooperative. Patient is in no acute distress.  Skin: Skin is warm and dry. No rash noted.   Cardiovascular: Normal heart rate noted  Respiratory: Normal respiratory effort, no problems with respiration noted  Abdomen: Soft, gravid, appropriate for gestational age.  Pain/Pressure: Present     Pelvic: Cervical exam deferred        Extremities: Normal range of motion.  Edema: Trace  Mental Status: Normal mood and affect. Normal behavior. Normal judgment and thought content.   Assessment and Plan:  Pregnancy: [id number] at 1980w2d  1. Supervision of high risk pregnancy, antepartum - Routine prenatal care - Anticipatory guidance on upcoming appointment   2. History of preterm delivery, currently pregnant - Currently on weekly 17P injections   3. History of depression - Reports depression has worsened over the past 2 weeks, "some weeks are horrible" and "some weeks depression occurs 3-5 times that week", she denies SI or HI.    - Discussed option of increasing Zoloft medication or switching to Wellbutrin as patient has taken Wellbutrin in the past (does not remember outcome of medication when she was taking)  - Patient agrees to increasing Zoloft dosage, mood check in 2-3 weeks and possible change to Wellbutrin if no resolution  - sertraline (ZOLOFT) 100 MG tablet; Take 1 tablet (100 mg total) by mouth daily.  Dispense: 60 tablet; Refill: 0  Preterm labor symptoms and general obstetric precautions including but not limited to vaginal bleeding, contractions, leaking of fluid and fetal movement were reviewed in detail with the patient. Please refer to After Visit Summary for other counseling recommendations.  Return in about 4 weeks (around 02/04/2018) for ROB/17P .  Future Appointments  Date Time Provider Department Center  01/14/2018  8:45 AM [facility] NURSE [facility] None  01/22/2018 11:30 AM [facility] NURSE [facility] None  02/11/2018 10:45 AM [facility] US 5 [facility]    Sharyon CableVeronica C Shahla Betsill, CNM

## 2018-01-14 ENCOUNTER — Ambulatory Visit: Payer: Medicaid Other

## 2018-01-14 VITALS — Wt 194.0 lb

## 2018-01-14 DIAGNOSIS — Z3A20 20 weeks gestation of pregnancy: Secondary | ICD-10-CM

## 2018-01-14 NOTE — Progress Notes (Signed)
Pt is here for makena injection. Injection given in L arm, pt tolerated well.

## 2018-01-22 ENCOUNTER — Ambulatory Visit (INDEPENDENT_AMBULATORY_CARE_PROVIDER_SITE_OTHER): Payer: Medicaid Other

## 2018-01-22 VITALS — BP 106/68 | HR 62 | Wt 197.0 lb

## 2018-01-22 DIAGNOSIS — O09212 Supervision of pregnancy with history of pre-term labor, second trimester: Secondary | ICD-10-CM | POA: Diagnosis not present

## 2018-01-22 DIAGNOSIS — O09899 Supervision of other high risk pregnancies, unspecified trimester: Secondary | ICD-10-CM

## 2018-01-22 DIAGNOSIS — O09219 Supervision of pregnancy with history of pre-term labor, unspecified trimester: Principal | ICD-10-CM

## 2018-01-22 NOTE — Progress Notes (Signed)
Presents for 17P, given in right arm, tolerated well. LOT  [id number] EXP  04/2020 [facility]  [id number] PATIENT SUPPLIED  Administrations This Visit    HYDROXYprogesterone Caproate SOAJ 275 mg    Admin Date 01/22/2018 Action Given Dose 275 mg Route Subcutaneous Administered By Maretta BeesMcGlashan, Tiffine Henigan J, RMA         C/o knot at injection site, however, she has received the previous 4 shots in the same left arm.  Advised pt to let us know if she has the same problem with right arm and we will change to IM.

## 2018-01-28 NOTE — L&D Delivery Note (Signed)
Patient: Taylor Buck MRN: [id number]  GBS status: negative, IAP: not indicated  Patient is a 28 y.o. now G6P2 s/p NSVD at [redacted]w[redacted]d, who was admitted for SOL. AROM 2h 31m prior to delivery with clear fluid.    Delivery Note At 5:08 AM a viable female was delivered via Vaginal, Spontaneous (Presentation: cephalic; ROA).  APGAR: 9, 9; weight 6 lb 13.4 oz (3100 g).   Placenta status: intact, 3-vessel cord.  Cord:  with the following complications: nuchal x 1, delivered through.  Cord pH: not collected.   Head delivered ROA. Nuchal cord present. Shoulder and body delivered in usual fashion. Infant with spontaneous cry, placed on mother's abdomen, dried and bulb suctioned. Cord clamped x 2 after 1-minute delay, and cut by family member. Cord blood drawn. Placenta delivered spontaneously with gentle cord traction. Fundus firm with massage and Pitocin. Perineum inspected and found to have no lacerations, with good hemostasis achieved.  Anesthesia: Epidural  Episiotomy: None Lacerations: None Suture Repair: NA Est. Blood Loss (mL): 7  Mom to postpartum.  Baby to Couplet care / Skin to Skin.  Gwenevere Abbot 05/27/2018, 9:16 AM

## 2018-01-29 ENCOUNTER — Ambulatory Visit: Payer: Medicaid Other

## 2018-01-29 VITALS — BP 99/63 | HR 76 | Wt 201.3 lb

## 2018-01-29 DIAGNOSIS — O09899 Supervision of other high risk pregnancies, unspecified trimester: Secondary | ICD-10-CM

## 2018-01-29 DIAGNOSIS — O09219 Supervision of pregnancy with history of pre-term labor, unspecified trimester: Principal | ICD-10-CM

## 2018-01-29 NOTE — Progress Notes (Signed)
I have reviewed this chart and agree with the RN/CMA assessment and management.    K. Meryl Aarron Wierzbicki, M.D. Attending [facility]   

## 2018-01-29 NOTE — Progress Notes (Signed)
Patient is in the office for 17-p injection. Administered in L arm, and pt tolerated well .Marland Kitchen Administrations This Visit    HYDROXYprogesterone Caproate SOAJ 275 mg    Admin Date 01/29/2018 Action Given Dose 275 mg Route Subcutaneous Administered By Katrina Stack, RN

## 2018-02-05 ENCOUNTER — Encounter: Payer: Self-pay | Admitting: Certified Nurse Midwife

## 2018-02-05 ENCOUNTER — Ambulatory Visit (INDEPENDENT_AMBULATORY_CARE_PROVIDER_SITE_OTHER): Payer: Medicaid Other | Admitting: Certified Nurse Midwife

## 2018-02-05 VITALS — BP 122/65 | HR 74 | Wt 197.0 lb

## 2018-02-05 DIAGNOSIS — O09212 Supervision of pregnancy with history of pre-term labor, second trimester: Secondary | ICD-10-CM | POA: Diagnosis not present

## 2018-02-05 DIAGNOSIS — O09899 Supervision of other high risk pregnancies, unspecified trimester: Secondary | ICD-10-CM

## 2018-02-05 DIAGNOSIS — O0990 Supervision of high risk pregnancy, unspecified, unspecified trimester: Secondary | ICD-10-CM

## 2018-02-05 DIAGNOSIS — Z8659 Personal history of other mental and behavioral disorders: Secondary | ICD-10-CM

## 2018-02-05 DIAGNOSIS — O0992 Supervision of high risk pregnancy, unspecified, second trimester: Secondary | ICD-10-CM

## 2018-02-05 DIAGNOSIS — O09219 Supervision of pregnancy with history of pre-term labor, unspecified trimester: Secondary | ICD-10-CM

## 2018-02-05 NOTE — Progress Notes (Signed)
ROB.  17P given in right arm, tolerated well.

## 2018-02-05 NOTE — Patient Instructions (Signed)
Glucose Tolerance Test During Pregnancy Why am I having this test? The glucose tolerance test (GTT) is done to check how your body processes sugar (glucose). This is one of several tests used to diagnose diabetes that develops during pregnancy (gestational diabetes mellitus). Gestational diabetes is a temporary form of diabetes that some women develop during pregnancy. It usually occurs during the second trimester of pregnancy and goes away after delivery. Testing (screening) for gestational diabetes usually occurs between 24 and 28 weeks of pregnancy. You may have the GTT test after having a 1-hour glucose screening test if the results from that test indicate that you may have gestational diabetes. You may also have this test if:  You have a history of gestational diabetes.  You have a history of giving birth to very large babies or have experienced repeated fetal loss (stillbirth).  You have signs and symptoms of diabetes, such as: ? Changes in your vision. ? Tingling or numbness in your hands or feet. ? Changes in hunger, thirst, and urination that are not otherwise explained by your pregnancy. What is being tested? This test measures the amount of glucose in your blood at different times during a period of 3 hours. This indicates how well your body is able to process glucose. What kind of sample is taken?  Blood samples are required for this test. They are usually collected by inserting a needle into a blood vessel. How do I prepare for this test?  For 3 days before your test, eat normally. Have plenty of carbohydrate-rich foods.  Follow instructions from your health care provider about: ? Eating or drinking restrictions on the day of the test. You may be asked to not eat or drink anything other than water (fast) starting 8-10 hours before the test. ? Changing or stopping your regular medicines. Some medicines may interfere with this test. Tell a health care provider about:  All  medicines you are taking, including vitamins, herbs, eye drops, creams, and over-the-counter medicines.  Any blood disorders you have.  Any surgeries you have had.  Any medical conditions you have. What happens during the test? First, your blood glucose will be measured. This is referred to as your fasting blood glucose, since you fasted before the test. Then, you will drink a glucose solution that contains a certain amount of glucose. Your blood glucose will be measured again 1, 2, and 3 hours after drinking the solution. This test takes about 3 hours to complete. You will need to stay at the testing location during this time. During the testing period:  Do not eat or drink anything other than the glucose solution.  Do not exercise.  Do not use any products that contain nicotine or tobacco, such as cigarettes and e-cigarettes. If you need help stopping, ask your health care provider. The testing procedure may vary among health care providers and hospitals. How are the results reported? Your results will be reported as milligrams of glucose per deciliter of blood (mg/dL) or millimoles per liter (mmol/L). Your health care provider will compare your results to normal ranges that were established after testing a large group of people (reference ranges). Reference ranges may vary among labs and hospitals. For this test, common reference ranges are:  Fasting: less than 95-105 mg/dL (5.3-5.8 mmol/L).  1 hour after drinking glucose: less than 180-190 mg/dL (10.0-10.5 mmol/L).  2 hours after drinking glucose: less than 155-165 mg/dL (8.6-9.2 mmol/L).  3 hours after drinking glucose: 140-145 mg/dL (7.8-8.1 mmol/L). What do the   results mean? Results within reference ranges are considered normal, meaning that your glucose levels are well-controlled. If two or more of your blood glucose levels are high, you may be diagnosed with gestational diabetes. If only one level is high, your health care  provider may suggest repeat testing or other tests to confirm a diagnosis. Talk with your health care provider about what your results mean. Questions to ask your health care provider Ask your health care provider, or the department that is doing the test:  When will my results be ready?  How will I get my results?  What are my treatment options?  What other tests do I need?  What are my next steps? Summary  The glucose tolerance test (GTT) is one of several tests used to diagnose diabetes that develops during pregnancy (gestational diabetes mellitus). Gestational diabetes is a temporary form of diabetes that some women develop during pregnancy.  You may have the GTT test after having a 1-hour glucose screening test if the results from that test indicate that you may have gestational diabetes. You may also have this test if you have any symptoms or risk factors for gestational diabetes.  Talk with your health care provider about what your results mean. This information is not intended to replace advice given to you by your health care provider. Make sure you discuss any questions you have with your health care provider. Document Released: 07/16/2011 Document Revised: 08/26/2016 Document Reviewed: 08/26/2016 Elsevier Interactive Patient Education  2019 [employer] Inc.  

## 2018-02-06 NOTE — Progress Notes (Signed)
   PRENATAL VISIT NOTE  Subjective:  Taylor Buck is a 28 y.o. [phone] at [redacted]w[redacted]d being seen today for ongoing prenatal care.  She is currently monitored for the following issues for this high-risk pregnancy and has Supervision of high risk pregnancy, antepartum; History of preterm delivery, currently pregnant; Bipolar 1 disorder (HCC); and History of depression on their problem list.  Patient reports fatigue.  Contractions: Irritability. Vag. Bleeding: None.  Movement: Present. Denies leaking of fluid.   The following portions of the patient's history were reviewed and updated as appropriate: allergies, current medications, past family history, past medical history, past social history, past surgical history and problem list. Problem list updated.  Objective:  Vitals:   02/05/18 0906  BP: 122/65  Pulse: 74  Weight: 197 lb (89.4 kg)    Fetal Status: Fetal Heart Rate (bpm): 150 Fundal Height: 21 cm Movement: Present     General:  Alert, oriented and cooperative. Patient is in no acute distress.  Skin: Skin is warm and dry. No rash noted.   Cardiovascular: Normal heart rate noted  Respiratory: Normal respiratory effort, no problems with respiration noted  Abdomen: Soft, gravid, appropriate for gestational age.  Pain/Pressure: Present     Pelvic: Cervical exam deferred        Extremities: Normal range of motion.  Edema: Trace  Mental Status: Normal mood and affect. Normal behavior. Normal judgment and thought content.   Assessment and Plan:  Pregnancy: [id number] at [redacted]w[redacted]d  1. Supervision of high risk pregnancy, antepartum - Routine prenatal care - Anticipatory guidance on upcoming appointments - Follow up US scheduled   2. History of preterm delivery, currently pregnant - Continue weekly Makena injections  - Preterm labor precautions and reasons to go to MAU to be evaluated discussed   3. History of depression - Currently on Zoloft 100mg  daily for hx of depression  and bipolar  - Denies manic behavior or suicidal ideation  - Reports stability of depression since being switched to higher dosage of Zoloft, reports depression is not getting better at this moment but is not worsening  - Educated on SSRI use during pregnancy, discussed giving dosage of medication a full month to work- if symptoms worsen prior to reassessment call office and will switch medication to different medication.   Preterm labor symptoms and general obstetric precautions including but not limited to vaginal bleeding, contractions, leaking of fluid and fetal movement were reviewed in detail with the patient. Please refer to After Visit Summary for other counseling recommendations.  Return in about 4 weeks (around 03/05/2018) for ROB/2HrGTT/labs.  Future Appointments  Date Time Provider Department Center  02/11/2018 10:45 AM [facility] [place] 5 [facility]  02/12/2018  8:30 AM [facility] NURSE [facility] None  02/19/2018  8:30 AM [facility] NURSE [facility] None  02/26/2018  8:30 AM [facility] NURSE [facility] None  03/05/2018  8:30 AM [facility] LAB [facility] None  03/05/2018  8:45 AM Conan Bowens, MD [facility] None    Sharyon Cable, CNM

## 2018-02-11 ENCOUNTER — Encounter (HOSPITAL_COMMUNITY): Payer: Self-pay

## 2018-02-11 ENCOUNTER — Ambulatory Visit (HOSPITAL_COMMUNITY)
Admission: RE | Admit: 2018-02-11 | Discharge: 2018-02-11 | Disposition: A | Discharge status: Home / Self Care | Payer: Medicaid Other | Source: Ambulatory Visit | Attending: Certified Nurse Midwife | Admitting: Certified Nurse Midwife

## 2018-02-11 DIAGNOSIS — O09212 Supervision of pregnancy with history of pre-term labor, second trimester: Secondary | ICD-10-CM | POA: Diagnosis not present

## 2018-02-11 DIAGNOSIS — Z362 Encounter for other antenatal screening follow-up: Secondary | ICD-10-CM | POA: Insufficient documentation

## 2018-02-11 DIAGNOSIS — O99212 Obesity complicating pregnancy, second trimester: Secondary | ICD-10-CM

## 2018-02-11 DIAGNOSIS — Z3A24 24 weeks gestation of pregnancy: Secondary | ICD-10-CM | POA: Diagnosis not present

## 2018-02-12 ENCOUNTER — Ambulatory Visit (INDEPENDENT_AMBULATORY_CARE_PROVIDER_SITE_OTHER): Payer: Medicaid Other

## 2018-02-12 VITALS — BP 102/60 | HR 76 | Wt 196.0 lb

## 2018-02-12 DIAGNOSIS — O09219 Supervision of pregnancy with history of pre-term labor, unspecified trimester: Secondary | ICD-10-CM

## 2018-02-12 DIAGNOSIS — O0992 Supervision of high risk pregnancy, unspecified, second trimester: Secondary | ICD-10-CM | POA: Diagnosis not present

## 2018-02-12 DIAGNOSIS — O0990 Supervision of high risk pregnancy, unspecified, unspecified trimester: Secondary | ICD-10-CM

## 2018-02-12 DIAGNOSIS — O09899 Supervision of other high risk pregnancies, unspecified trimester: Secondary | ICD-10-CM

## 2018-02-12 NOTE — Progress Notes (Signed)
Presents for 17P, given in left arm, tolerated well.  Administrations This Visit    HYDROXYprogesterone Caproate SOAJ 275 mg    Admin Date 02/12/2018 Action Given Dose 275 mg Route Subcutaneous Administered By Maretta Bees, RMA

## 2018-02-13 ENCOUNTER — Encounter (HOSPITAL_COMMUNITY): Payer: Self-pay | Admitting: *Deleted

## 2018-02-13 ENCOUNTER — Inpatient Hospital Stay (HOSPITAL_COMMUNITY)
Admission: AD | Admit: 2018-02-13 | Discharge: 2018-02-13 | Disposition: A | Discharge status: Home / Self Care | Payer: Medicaid Other | Attending: Obstetrics & Gynecology | Admitting: Obstetrics & Gynecology

## 2018-02-13 ENCOUNTER — Other Ambulatory Visit: Payer: Self-pay

## 2018-02-13 DIAGNOSIS — O9962 Diseases of the digestive system complicating childbirth: Secondary | ICD-10-CM | POA: Diagnosis not present

## 2018-02-13 DIAGNOSIS — K920 Hematemesis: Secondary | ICD-10-CM | POA: Insufficient documentation

## 2018-02-13 DIAGNOSIS — K2100 Gastro-esophageal reflux disease with esophagitis, without bleeding: Secondary | ICD-10-CM

## 2018-02-13 DIAGNOSIS — Z3A24 24 weeks gestation of pregnancy: Secondary | ICD-10-CM | POA: Diagnosis not present

## 2018-02-13 DIAGNOSIS — R1013 Epigastric pain: Secondary | ICD-10-CM | POA: Diagnosis present

## 2018-02-13 DIAGNOSIS — F17210 Nicotine dependence, cigarettes, uncomplicated: Secondary | ICD-10-CM | POA: Diagnosis not present

## 2018-02-13 DIAGNOSIS — O99332 Smoking (tobacco) complicating pregnancy, second trimester: Secondary | ICD-10-CM | POA: Insufficient documentation

## 2018-02-13 DIAGNOSIS — R12 Heartburn: Secondary | ICD-10-CM

## 2018-02-13 DIAGNOSIS — Z880 Allergy status to penicillin: Secondary | ICD-10-CM | POA: Insufficient documentation

## 2018-02-13 DIAGNOSIS — O26893 Other specified pregnancy related conditions, third trimester: Secondary | ICD-10-CM

## 2018-02-13 LAB — URINALYSIS, ROUTINE W REFLEX MICROSCOPIC
BILIRUBIN URINE: NEGATIVE
GLUCOSE, UA: NEGATIVE mg/dL
HGB URINE DIPSTICK: NEGATIVE
KETONES UR: NEGATIVE mg/dL
Leukocytes, UA: NEGATIVE
Nitrite: NEGATIVE
Protein, ur: NEGATIVE mg/dL
Specific Gravity, Urine: 1.014 (ref 1.005–1.030)
pH: 7 (ref 5.0–8.0)

## 2018-02-13 MED ORDER — ALUM & MAG HYDROXIDE-SIMETH 200-200-20 MG/5ML PO SUSP
30.0000 mL | Freq: Once | ORAL | Status: AC
  Administered 2018-02-13: 30 mL via ORAL
  Filled 2018-02-13: qty 30

## 2018-02-13 MED ORDER — LIDOCAINE VISCOUS HCL 2 % MT SOLN
15.0000 mL | Freq: Once | OROMUCOSAL | Status: AC
  Administered 2018-02-13: 15 mL via ORAL
  Filled 2018-02-13: qty 15

## 2018-02-13 MED ORDER — ONDANSETRON 4 MG PO TBDP
4.0000 mg | ORAL_TABLET | Freq: Once | ORAL | Status: AC
  Administered 2018-02-13: 4 mg via ORAL
  Filled 2018-02-13: qty 1

## 2018-02-13 NOTE — MAU Provider Note (Signed)
Chief Complaint:  Emesis; Abdominal Pain; and Headache   First Provider Initiated Contact with Patient 02/13/18 [phone]      HPI: Taylor Buck is a 28 y.o. [id number] at 105w4d by LMP who presents to maternity admissions reporting onset of upper abdominal/epigastric pain upon waking this morning followed by vomiting x 2-3 with scant bleeding in her emesis. She became worried with the blood and came in for evaluation.  She denies nausea in MAU but reports the pain in her epigastric area is still there, constant, burning and pressure pain that radiates up into her chest and throat. She ate french fries late, immediately before going to bed.  There is no lower abdominal pain.  There are no other symptoms. She took 2 Tums that did not help. She has not tried any other treatments.  She reports good fetal movement.   HPI  Past Medical History: Past Medical History:  Diagnosis Date  . Anxiety   . Arthritis   . Asthma   . Depression   . Heart murmur   . Infection    chlamydia age 44  . PCOS (polycystic ovarian syndrome)   . Sleep apnea   . Urinary tract infection     Past obstetric history: OB History  Gravida Para Term Preterm AB Living  6 1 0 1 4 1   SAB TAB Ectopic Multiple Live Births  4       1    # Outcome Date GA Lbr Len/2nd Weight Sex Delivery Anes PTL Lv  6 Current           5 SAB 04/2017          4 Preterm 07/31/12 [redacted]w[redacted]d 07:00  M Vag-Spont None Y LIV  3 SAB           2 SAB           1 SAB             Past Surgical History: Past Surgical History:  Procedure Laterality Date  . HAND SURGERY     reconstruction- injury due to frostbite as child  . HERNIA REPAIR     rt inguinal    Family History: Family History  Problem Relation Age of Onset  . Diabetes Mother   . COPD Mother   . Diabetes Maternal Aunt   . Cancer Maternal Aunt        breast  . Diabetes Maternal Grandmother   . Cancer Maternal Grandmother        skin cancer  . Alcoholism Other   .  Arthritis Other   . Asthma Other   . Depression Other   . Hearing loss Neg Hx     Social History: Social History   Tobacco Use  . Smoking status: Current Every Day Smoker    Packs/day: 0.25    Types: Cigarettes  . Smokeless tobacco: Never Used  . Tobacco comment: 1 PACK A DAY - 4 CIGARETTES A DAY   Substance Use Topics  . Alcohol use: No  . Drug use: Not Currently    Types: Marijuana    Comment: UP UNTIL +UPT    Allergies:  Allergies  Allergen Reactions  . Milk-Related Compounds Anaphylaxis  . Latex Hives  . Penicillins Hives    Has patient had a PCN reaction causing immediate rash, facial/tongue/throat swelling, SOB or lightheadedness with hypotension: Yes Has patient had a PCN reaction causing severe rash involving mucus membranes or skin necrosis: No Has patient had a PCN  reaction that required hospitalization: No Has patient had a PCN reaction occurring within the last 10 years: No If all of the above answers are "NO", then may proceed with Cephalosporin use.   . Reglan [Metoclopramide] Other (See Comments)    Panic attack    Meds:  Facility-Administered Medications Prior to Admission  Medication Dose Route Frequency Provider Last Rate Last Dose  . HYDROXYprogesterone Caproate SOAJ 275 mg  275 mg Subcutaneous Weekly Constant, Peggy, MD   275 mg at 02/12/18 [id number]   Medications Prior to Admission  Medication Sig Dispense Refill Last Dose  . acetaminophen (TYLENOL) 325 MG tablet Take 650 mg by mouth daily as needed for pain.   Taking  . calcium carbonate (TUMS EX) 750 MG chewable tablet Chew 1 tablet by mouth at bedtime as needed (for nausea).   Taking  . omeprazole (PRILOSEC) 20 MG capsule Take 1 capsule (20 mg total) by mouth daily. 60 capsule 5 Taking  . ondansetron (ZOFRAN-ODT) 8 MG disintegrating tablet Take 1 tablet (8 mg total) by mouth every 8 (eight) hours as needed for nausea or vomiting. (Patient not taking: Reported on 02/11/2018) 20 tablet 0 Not Taking  .  Prenatal Vit-Fe Phos-FA-Omega (VITAFOL GUMMIES) 3.33-0.333-34.8 MG CHEW Chew 3 tablets by mouth daily. 90 tablet 2 Taking  . promethazine (PHENERGAN) 12.5 MG tablet Take 1 tablet (12.5 mg total) by mouth every 6 (six) hours as needed for nausea or vomiting. (Patient not taking: Reported on 12/10/2017) 30 tablet 0 Not Taking  . sertraline (ZOLOFT) 100 MG tablet Take 1 tablet (100 mg total) by mouth daily. 60 tablet 0 Taking    ROS:  Review of Systems  Constitutional: Negative for chills, fatigue and fever.  Eyes: Negative for visual disturbance.  Respiratory: Negative for shortness of breath.   Cardiovascular: Negative for chest pain.  Gastrointestinal: Positive for abdominal pain and vomiting. Negative for nausea.  Genitourinary: Negative for difficulty urinating, dysuria, flank pain, pelvic pain, vaginal bleeding, vaginal discharge and vaginal pain.  Neurological: Negative for dizziness and headaches.  Psychiatric/Behavioral: Negative.      I have reviewed patient's Past Medical Hx, Surgical Hx, Family Hx, Social Hx, medications and allergies.   Physical Exam   Patient Vitals for the past 24 hrs:  Weight  02/13/18 0848 89.7 kg   Constitutional: Well-developed, well-nourished female in no acute distress.  Cardiovascular: normal rate Respiratory: normal effort GI: Abd soft, non-tender, gravid appropriate for gestational age.  MS: Extremities nontender, no edema, normal ROM Neurologic: Alert and oriented x 4.  GU: Neg CVAT.  PELVIC EXAM: Cervix pink, visually closed, without lesion, scant white creamy discharge, vaginal walls and external genitalia normal Bimanual exam: Cervix 0/long/high, firm, anterior, neg CMT, uterus nontender, nonenlarged, adnexa without tenderness, enlargement, or mass     FHT:  Baseline 140 , moderate variability, accelerations present, no decelerations Contractions: None on toco or to palpation   Labs: Results for orders placed or performed during the  hospital encounter of 02/13/18 (from the past 24 hour(s))  Urinalysis, Routine w reflex microscopic     Status: Abnormal   Collection Time: 02/13/18  9:32 AM  Result Value Ref Range   Color, Urine YELLOW YELLOW   APPearance CLOUDY (A) CLEAR   Specific Gravity, Urine 1.014 1.005 - 1.030   pH 7.0 5.0 - 8.0   Glucose, UA NEGATIVE NEGATIVE mg/dL   Hgb urine dipstick NEGATIVE NEGATIVE   Bilirubin Urine NEGATIVE NEGATIVE   Ketones, ur NEGATIVE NEGATIVE mg/dL   Protein,  ur NEGATIVE NEGATIVE mg/dL   Nitrite NEGATIVE NEGATIVE   Leukocytes, UA NEGATIVE NEGATIVE   A/Positive/-- (10/10 1658)  Imaging:    MAU Course/MDM: Orders Placed This Encounter  Procedures  . Urinalysis, Routine w reflex microscopic  . Discharge patient    Meds ordered this encounter  Medications  . AND Linked Order Group   . alum & mag hydroxide-simeth (MAALOX/MYLANTA) 200-200-20 MG/5ML suspension 30 mL   . lidocaine (XYLOCAINE) 2 % viscous mouth solution 15 mL  . ondansetron (ZOFRAN-ODT) disintegrating tablet 4 mg     NST reviewed and reactive GI Cocktail and Zofran 4 mg ODT given in MAU with complete resolution of symptoms.  Reassurance provided that hematemesis is likely from acid reflux, related to pregnancy.   Recommend Pepcid BID PRN for symptoms or resume daily Prilosec as previously prescribed F/U in office as scheduled Return to MAU for emergencies Pt discharge with strict return precautions.    Assessment: 1. Gastroesophageal reflux disease with esophagitis   2. Heartburn during pregnancy in third trimester   3. Hematemesis with nausea     Plan: Discharge home Follow-up Information    [facility]   Why:  As scheduled, return to MAU as needed for emergencies. Contact information: [address] [place] [id number] [phone]         Allergies as of 02/13/2018      Reactions   Milk-related Compounds Anaphylaxis   Latex Hives    Penicillins Hives   Has patient had a PCN reaction causing immediate rash, facial/tongue/throat swelling, SOB or lightheadedness with hypotension: Yes Has patient had a PCN reaction causing severe rash involving mucus membranes or skin necrosis: No Has patient had a PCN reaction that required hospitalization: No Has patient had a PCN reaction occurring within the last 10 years: No If all of the above answers are "NO", then may proceed with Cephalosporin use.   Reglan [metoclopramide] Other (See Comments)   Panic attack      Medication List    TAKE these medications   acetaminophen 325 MG tablet Commonly known as:  TYLENOL Take 650 mg by mouth daily as needed for pain.   calcium carbonate 750 MG chewable tablet Commonly known as:  TUMS EX Chew 1 tablet by mouth at bedtime as needed (for nausea).   omeprazole 20 MG capsule Commonly known as:  PRILOSEC Take 1 capsule (20 mg total) by mouth daily.   ondansetron 8 MG disintegrating tablet Commonly known as:  ZOFRAN-ODT Take 1 tablet (8 mg total) by mouth every 8 (eight) hours as needed for nausea or vomiting.   promethazine 12.5 MG tablet Commonly known as:  PHENERGAN Take 1 tablet (12.5 mg total) by mouth every 6 (six) hours as needed for nausea or vomiting.   sertraline 100 MG tablet Commonly known as:  ZOLOFT Take 1 tablet (100 mg total) by mouth daily.   VITAFOL GUMMIES 3.33-0.333-34.8 MG Chew Chew 3 tablets by mouth daily.       Sharen CounterLisa Leftwich-Kirby Certified Nurse-Midwife 02/13/2018 11:02 AM

## 2018-02-13 NOTE — MAU Note (Signed)
Pain started at 0700, middle of upper abd.  Started vomiting, noted blood in it.

## 2018-02-19 ENCOUNTER — Ambulatory Visit (INDEPENDENT_AMBULATORY_CARE_PROVIDER_SITE_OTHER): Payer: Medicaid Other

## 2018-02-19 DIAGNOSIS — O09212 Supervision of pregnancy with history of pre-term labor, second trimester: Secondary | ICD-10-CM | POA: Diagnosis not present

## 2018-02-19 DIAGNOSIS — O09219 Supervision of pregnancy with history of pre-term labor, unspecified trimester: Principal | ICD-10-CM

## 2018-02-19 DIAGNOSIS — O09899 Supervision of other high risk pregnancies, unspecified trimester: Secondary | ICD-10-CM

## 2018-02-19 NOTE — Progress Notes (Signed)
Nurse visit for pt supplied Makena given R arm w/o complaints.

## 2018-02-26 ENCOUNTER — Ambulatory Visit: Payer: Medicaid Other

## 2018-02-26 DIAGNOSIS — Z7189 Other specified counseling: Secondary | ICD-10-CM

## 2018-02-26 DIAGNOSIS — Z20828 Contact with and (suspected) exposure to other viral communicable diseases: Secondary | ICD-10-CM

## 2018-02-26 MED ORDER — OSELTAMIVIR PHOSPHATE 75 MG PO CAPS: 75.0000 mg | ORAL_CAPSULE | Freq: Every day | ORAL | 0 refills | Status: AC

## 2018-02-26 NOTE — Progress Notes (Signed)
Makena injection given.

## 2018-02-26 NOTE — Progress Notes (Signed)
Pt is here for 17p injection. She states that her son has just been confirmed to have the flu and started medication and would like to know if she can have some medication. Discussed with Idelia Salm and she approved pt to get Tamiflu.

## 2018-03-05 ENCOUNTER — Other Ambulatory Visit: Payer: Medicaid Other

## 2018-03-05 ENCOUNTER — Ambulatory Visit (INDEPENDENT_AMBULATORY_CARE_PROVIDER_SITE_OTHER): Payer: Medicaid Other | Admitting: Obstetrics and Gynecology

## 2018-03-05 ENCOUNTER — Encounter: Payer: Self-pay | Admitting: Obstetrics and Gynecology

## 2018-03-05 VITALS — BP 114/69 | HR 82 | Wt 204.7 lb

## 2018-03-05 DIAGNOSIS — F1911 Other psychoactive substance abuse, in remission: Secondary | ICD-10-CM | POA: Insufficient documentation

## 2018-03-05 DIAGNOSIS — O09899 Supervision of other high risk pregnancies, unspecified trimester: Secondary | ICD-10-CM

## 2018-03-05 DIAGNOSIS — Z8659 Personal history of other mental and behavioral disorders: Secondary | ICD-10-CM

## 2018-03-05 DIAGNOSIS — O09219 Supervision of pregnancy with history of pre-term labor, unspecified trimester: Secondary | ICD-10-CM

## 2018-03-05 DIAGNOSIS — O0992 Supervision of high risk pregnancy, unspecified, second trimester: Secondary | ICD-10-CM

## 2018-03-05 DIAGNOSIS — F319 Bipolar disorder, unspecified: Secondary | ICD-10-CM

## 2018-03-05 DIAGNOSIS — O0990 Supervision of high risk pregnancy, unspecified, unspecified trimester: Secondary | ICD-10-CM

## 2018-03-05 DIAGNOSIS — Z23 Encounter for immunization: Secondary | ICD-10-CM | POA: Diagnosis not present

## 2018-03-05 DIAGNOSIS — Z87898 Personal history of other specified conditions: Secondary | ICD-10-CM | POA: Insufficient documentation

## 2018-03-05 DIAGNOSIS — O09212 Supervision of pregnancy with history of pre-term labor, second trimester: Secondary | ICD-10-CM

## 2018-03-05 DIAGNOSIS — O99342 Other mental disorders complicating pregnancy, second trimester: Secondary | ICD-10-CM

## 2018-03-05 MED ORDER — SERTRALINE HCL 25 MG PO TABS: 25.0000 mg | ORAL_TABLET | Freq: Every day | ORAL | 2 refills | Status: DC

## 2018-03-05 NOTE — Progress Notes (Signed)
Pt is here for ROB. [redacted]w[redacted]d.

## 2018-03-05 NOTE — Progress Notes (Signed)
   PRENATAL VISIT NOTE  Subjective:  Taylor Buck is a 28 y.o. [phone] at [redacted]w[redacted]d being seen today for ongoing prenatal care.  She is currently monitored for the following issues for this high-risk pregnancy and has Supervision of high risk pregnancy, antepartum; History of preterm delivery, currently pregnant; Bipolar 1 disorder (HCC); History of depression; and H/O: substance abuse (HCC) on their problem list.  Patient reports occasional contractions.  Contractions: Not present. Vag. Bleeding: None.  Movement: Present. Denies leaking of fluid.   The following portions of the patient's history were reviewed and updated as appropriate: allergies, current medications, past family history, past medical history, past social history, past surgical history and problem list. Problem list updated.  Objective:   Vitals:   03/05/18 0825  BP: 114/69  Pulse: 82  Weight: 204 lb 11.2 oz (92.9 kg)    Fetal Status: Fetal Heart Rate (bpm): 140   Movement: Present     General:  Alert, oriented and cooperative. Patient is in no acute distress.  Skin: Skin is warm and dry. No rash noted.   Cardiovascular: Normal heart rate noted  Respiratory: Normal respiratory effort, no problems with respiration noted  Abdomen: Soft, gravid, appropriate for gestational age.  Pain/Pressure: Present     Pelvic: Cervical exam deferred        Extremities: Normal range of motion.  Edema: None  Mental Status: Normal mood and affect. Normal behavior. Normal judgment and thought content.   Assessment and Plan:  Pregnancy: [id number] at [redacted]w[redacted]d  1. Supervision of high risk pregnancy, antepartum - Tdap vaccine greater than or equal to 7yo IM - Glucose Tolerance, 2 Hours w/1 Hour - CBC - RPR - HIV Antibody (routine testing w rflx)  2. History of preterm delivery, currently pregnant Cont 17P  3. Bipolar 1 disorder (HCC) On zoloft 100 mg daily, feels like she has days where she cannot get out of bed, feels  like her medicine is not working well yet - denies suicidal/homicidal ideation - will increase to zoloft 125 mg daily - sertraline (ZOLOFT) 25 MG tablet; Take 1 tablet (25 mg total) by mouth daily. Take one 25 mg tab in addition to the 100 mg tab daily, to make 125 mg daily.  Dispense: 30 tablet; Refill: 2  4. History of depression  5. H/O: substance abuse (HCC) Has struggled with opioid addiction since age 35, was on pills after last delivery, has been clean for 3 years, strongly desires to not have any opioids after this delivery - reviewed pain management strategies   Preterm labor symptoms and general obstetric precautions including but not limited to vaginal bleeding, contractions, leaking of fluid and fetal movement were reviewed in detail with the patient. Please refer to After Visit Summary for other counseling recommendations.  Return in about 2 weeks (around 03/19/2018) for OB visit (MD), weekly 17P, 17P.  No future appointments.  Conan Bowens, MD

## 2018-03-06 LAB — GLUCOSE TOLERANCE, 2 HOURS W/ 1HR
GLUCOSE, FASTING: 76 mg/dL (ref 65–91)
Glucose, 1 hour: 125 mg/dL (ref 65–179)
Glucose, 2 hour: 77 mg/dL (ref 65–152)

## 2018-03-06 LAB — CBC
Hematocrit: 37.9 % (ref 34.0–46.6)
Hemoglobin: 12.6 g/dL (ref 11.1–15.9)
MCH: 28.7 pg (ref 26.6–33.0)
MCHC: 33.2 g/dL (ref 31.5–35.7)
MCV: 86 fL (ref 79–97)
Platelets: 254 10*3/uL (ref 150–450)
RBC: 4.39 x10E6/uL (ref 3.77–5.28)
RDW: 13 % (ref 11.7–15.4)
WBC: 10.4 10*3/uL (ref 3.4–10.8)

## 2018-03-06 LAB — RPR: RPR Ser Ql: NONREACTIVE

## 2018-03-06 LAB — HIV ANTIBODY (ROUTINE TESTING W REFLEX): HIV Screen 4th Generation wRfx: NONREACTIVE

## 2018-03-12 ENCOUNTER — Ambulatory Visit: Payer: Medicaid Other

## 2018-03-12 VITALS — BP 104/67 | HR 68 | Wt 201.0 lb

## 2018-03-12 DIAGNOSIS — O09899 Supervision of other high risk pregnancies, unspecified trimester: Secondary | ICD-10-CM

## 2018-03-12 DIAGNOSIS — O09219 Supervision of pregnancy with history of pre-term labor, unspecified trimester: Principal | ICD-10-CM

## 2018-03-12 NOTE — Progress Notes (Signed)
Patient is in the office for 17-p injection, administered in R arm and pt tolerated well .. Administrations This Visit    HYDROXYprogesterone Caproate SOAJ 275 mg    Admin Date 03/12/2018 Action Given Dose 275 mg Route Subcutaneous Administered By Jeanae Whitmill D, RN         

## 2018-03-12 NOTE — Progress Notes (Signed)
I have reviewed the chart and agree with nursing staff's documentation of this patient's encounter.  Catalina Antigua, MD 03/12/2018 9:52 AM

## 2018-03-19 ENCOUNTER — Ambulatory Visit (INDEPENDENT_AMBULATORY_CARE_PROVIDER_SITE_OTHER): Payer: Medicaid Other | Admitting: Obstetrics and Gynecology

## 2018-03-19 ENCOUNTER — Encounter: Payer: Self-pay | Admitting: Obstetrics and Gynecology

## 2018-03-19 VITALS — BP 115/65 | HR 76 | Wt 201.2 lb

## 2018-03-19 DIAGNOSIS — O0993 Supervision of high risk pregnancy, unspecified, third trimester: Secondary | ICD-10-CM

## 2018-03-19 DIAGNOSIS — Z3A29 29 weeks gestation of pregnancy: Secondary | ICD-10-CM

## 2018-03-19 DIAGNOSIS — Z8659 Personal history of other mental and behavioral disorders: Secondary | ICD-10-CM

## 2018-03-19 DIAGNOSIS — F1911 Other psychoactive substance abuse, in remission: Secondary | ICD-10-CM

## 2018-03-19 DIAGNOSIS — O09899 Supervision of other high risk pregnancies, unspecified trimester: Secondary | ICD-10-CM

## 2018-03-19 DIAGNOSIS — O09219 Supervision of pregnancy with history of pre-term labor, unspecified trimester: Secondary | ICD-10-CM

## 2018-03-19 DIAGNOSIS — O0990 Supervision of high risk pregnancy, unspecified, unspecified trimester: Secondary | ICD-10-CM

## 2018-03-19 DIAGNOSIS — O99343 Other mental disorders complicating pregnancy, third trimester: Secondary | ICD-10-CM

## 2018-03-19 DIAGNOSIS — O09213 Supervision of pregnancy with history of pre-term labor, third trimester: Secondary | ICD-10-CM

## 2018-03-19 NOTE — Progress Notes (Signed)
Subjective:  Taylor Buck is a 28 y.o. [phone] at [redacted]w[redacted]d being seen today for ongoing prenatal care.  She is currently monitored for the following issues for this high-risk pregnancy and has Supervision of high risk pregnancy, antepartum; History of preterm delivery, currently pregnant; Bipolar 1 disorder (HCC); History of depression; and H/O: substance abuse (HCC) on their problem list.  Patient reports no complaints.  Contractions: Irritability. Vag. Bleeding: None.  Movement: Present. Denies leaking of fluid.   The following portions of the patient's history were reviewed and updated as appropriate: allergies, current medications, past family history, past medical history, past social history, past surgical history and problem list. Problem list updated.  Objective:   Vitals:   03/19/18 0819  BP: 115/65  Pulse: 76  Weight: 201 lb 3.2 oz (91.3 kg)    Fetal Status: Fetal Heart Rate (bpm): 139   Movement: Present     General:  Alert, oriented and cooperative. Patient is in no acute distress.  Skin: Skin is warm and dry. No rash noted.   Cardiovascular: Normal heart rate noted  Respiratory: Normal respiratory effort, no problems with respiration noted  Abdomen: Soft, gravid, appropriate for gestational age. Pain/Pressure: Present     Pelvic:  Cervical exam deferred        Extremities: Normal range of motion.  Edema: Trace  Mental Status: Normal mood and affect. Normal behavior. Normal judgment and thought content.   Urinalysis:      Assessment and Plan:  Pregnancy: [id number] at [redacted]w[redacted]d  1. Supervision of high risk pregnancy, antepartum Stable  2. History of preterm delivery, currently pregnant Stable Continue with weekly 17 OHP  3. History of depression Feels much better with Zoloft 125 mg qd. Pt made aware of [facility] services, declines at present. Instructed to let us know if she needs  4. H/O: substance abuse (HCC) Stable  Preterm labor symptoms and general  obstetric precautions including but not limited to vaginal bleeding, contractions, leaking of fluid and fetal movement were reviewed in detail with the patient. Please refer to After Visit Summary for other counseling recommendations.  Return in about 2 weeks (around 04/02/2018) for OB visit.   Hermina Staggers, MD

## 2018-03-19 NOTE — Progress Notes (Signed)
ROB. 17P given in left arm, tolerated well.  Patient noted that her antidepressants are working, she is feeling better.

## 2018-03-19 NOTE — Patient Instructions (Signed)
Third Trimester of Pregnancy The third trimester is from week 28 through week 40 (months 7 through 9). The third trimester is a time when the unborn baby (fetus) is growing rapidly. At the end of the ninth month, the fetus is about 20 inches in length and weighs 6-10 pounds. Body changes during your third trimester Your body will continue to go through many changes during pregnancy. The changes vary from woman to woman. During the third trimester:  Your weight will continue to increase. You can expect to gain 25-35 pounds (11-16 kg) by the end of the pregnancy.  You may begin to get stretch marks on your hips, abdomen, and breasts.  You may urinate more often because the fetus is moving lower into your pelvis and pressing on your bladder.  You may develop or continue to have heartburn. This is caused by increased hormones that slow down muscles in the digestive tract.  You may develop or continue to have constipation because increased hormones slow digestion and cause the muscles that push waste through your intestines to relax.  You may develop hemorrhoids. These are swollen veins (varicose veins) in the rectum that can itch or be painful.  You may develop swollen, bulging veins (varicose veins) in your legs.  You may have increased body aches in the pelvis, back, or thighs. This is due to weight gain and increased hormones that are relaxing your joints.  You may have changes in your hair. These can include thickening of your hair, rapid growth, and changes in texture. Some women also have hair loss during or after pregnancy, or hair that feels dry or thin. Your hair will most likely return to normal after your baby is born.  Your breasts will continue to grow and they will continue to become tender. A yellow fluid (colostrum) may leak from your breasts. This is the first milk you are producing for your baby.  Your belly button may stick out.  You may notice more swelling in your hands,  face, or ankles.  You may have increased tingling or numbness in your hands, arms, and legs. The skin on your belly may also feel numb.  You may feel short of breath because of your expanding uterus.  You may have more problems sleeping. This can be caused by the size of your belly, increased need to urinate, and an increase in your body's metabolism.  You may notice the fetus "dropping," or moving lower in your abdomen (lightening).  You may have increased vaginal discharge.  You may notice your joints feel loose and you may have pain around your pelvic bone. What to expect at prenatal visits You will have prenatal exams every 2 weeks until week 36. Then you will have weekly prenatal exams. During a routine prenatal visit:  You will be weighed to make sure you and the baby are growing normally.  Your blood pressure will be taken.  Your abdomen will be measured to track your baby's growth.  The fetal heartbeat will be listened to.  Any test results from the previous visit will be discussed.  You may have a cervical check near your due date to see if your cervix has softened or thinned (effaced).  You will be tested for Group B streptococcus. This happens between 35 and 37 weeks. Your health care provider may ask you:  What your birth plan is.  How you are feeling.  If you are feeling the baby move.  If you have had any abnormal   symptoms, such as leaking fluid, bleeding, severe headaches, or abdominal cramping.  If you are using any tobacco products, including cigarettes, chewing tobacco, and electronic cigarettes.  If you have any questions. Other tests or screenings that may be performed during your third trimester include:  Blood tests that check for low iron levels (anemia).  Fetal testing to check the health, activity level, and growth of the fetus. Testing is done if you have certain medical conditions or if there are problems during the pregnancy.  Nonstress test  (NST). This test checks the health of your baby to make sure there are no signs of problems, such as the baby not getting enough oxygen. During this test, a belt is placed around your belly. The baby is made to move, and its heart rate is monitored during movement. What is false labor? False labor is a condition in which you feel small, irregular tightenings of the muscles in the womb (contractions) that usually go away with rest, changing position, or drinking water. These are called Braxton Hicks contractions. Contractions may last for hours, days, or even weeks before true labor sets in. If contractions come at regular intervals, become more frequent, increase in intensity, or become painful, you should see your health care provider. What are the signs of labor?  Abdominal cramps.  Regular contractions that start at 10 minutes apart and become stronger and more frequent with time.  Contractions that start on the top of the uterus and spread down to the lower abdomen and back.  Increased pelvic pressure and dull back pain.  A watery or bloody mucus discharge that comes from the vagina.  Leaking of amniotic fluid. This is also known as your "water breaking." It could be a slow trickle or a gush. Let your health care provider know if it has a color or strange odor. If you have any of these signs, call your health care provider right away, even if it is before your due date. Follow these instructions at home: Medicines  Follow your health care provider's instructions regarding medicine use. Specific medicines may be either safe or unsafe to take during pregnancy.  Take a prenatal vitamin that contains at least 600 micrograms (mcg) of folic acid.  If you develop constipation, try taking a stool softener if your health care provider approves. Eating and drinking   Eat a balanced diet that includes fresh fruits and vegetables, whole grains, good sources of protein such as meat, eggs, or tofu,  and low-fat dairy. Your health care provider will help you determine the amount of weight gain that is right for you.  Avoid raw meat and uncooked cheese. These carry germs that can cause birth defects in the baby.  If you have low calcium intake from food, talk to your health care provider about whether you should take a daily calcium supplement.  Eat four or five small meals rather than three large meals a day.  Limit foods that are high in fat and processed sugars, such as fried and sweet foods.  To prevent constipation: ? Drink enough fluid to keep your urine clear or pale yellow. ? Eat foods that are high in fiber, such as fresh fruits and vegetables, whole grains, and beans. Activity  Exercise only as directed by your health care provider. Most women can continue their usual exercise routine during pregnancy. Try to exercise for 30 minutes at least 5 days a week. Stop exercising if you experience uterine contractions.  Avoid heavy lifting.  Do   not exercise in extreme heat or humidity, or at high altitudes.  Wear low-heel, comfortable shoes.  Practice good posture.  You may continue to have sex unless your health care provider tells you otherwise. Relieving pain and discomfort  Take frequent breaks and rest with your legs elevated if you have leg cramps or low back pain.  Take warm sitz baths to soothe any pain or discomfort caused by hemorrhoids. Use hemorrhoid cream if your health care provider approves.  Wear a good support bra to prevent discomfort from breast tenderness.  If you develop varicose veins: ? Wear support pantyhose or compression stockings as told by your healthcare provider. ? Elevate your feet for 15 minutes, 3-4 times a day. Prenatal care  Write down your questions. Take them to your prenatal visits.  Keep all your prenatal visits as told by your health care provider. This is important. Safety  Wear your seat belt at all times when driving.  Make  a list of emergency phone numbers, including numbers for family, friends, the hospital, and police and fire departments. General instructions  Avoid cat litter boxes and soil used by cats. These carry germs that can cause birth defects in the baby. If you have a cat, ask someone to clean the litter box for you.  Do not travel far distances unless it is absolutely necessary and only with the approval of your health care provider.  Do not use hot tubs, steam rooms, or saunas.  Do not drink alcohol.  Do not use any products that contain nicotine or tobacco, such as cigarettes and e-cigarettes. If you need help quitting, ask your health care provider.  Do not use any medicinal herbs or unprescribed drugs. These chemicals affect the formation and growth of the baby.  Do not douche or use tampons or scented sanitary pads.  Do not cross your legs for long periods of time.  To prepare for the arrival of your baby: ? Take prenatal classes to understand, practice, and ask questions about labor and delivery. ? Make a trial run to the hospital. ? Visit the hospital and tour the maternity area. ? Arrange for maternity or paternity leave through employers. ? Arrange for family and friends to take care of pets while you are in the hospital. ? Purchase a rear-facing car seat and make sure you know how to install it in your car. ? Pack your hospital bag. ? Prepare the baby's nursery. Make sure to remove all pillows and stuffed animals from the baby's crib to prevent suffocation.  Visit your dentist if you have not gone during your pregnancy. Use a soft toothbrush to brush your teeth and be gentle when you floss. Contact a health care provider if:  You are unsure if you are in labor or if your water has broken.  You become dizzy.  You have mild pelvic cramps, pelvic pressure, or nagging pain in your abdominal area.  You have lower back pain.  You have persistent nausea, vomiting, or  diarrhea.  You have an unusual or bad smelling vaginal discharge.  You have pain when you urinate. Get help right away if:  Your water breaks before 37 weeks.  You have regular contractions less than 5 minutes apart before 37 weeks.  You have a fever.  You are leaking fluid from your vagina.  You have spotting or bleeding from your vagina.  You have severe abdominal pain or cramping.  You have rapid weight loss or weight gain.  You have   shortness of breath with chest pain.  You notice sudden or extreme swelling of your face, hands, ankles, feet, or legs.  Your baby makes fewer than 10 movements in 2 hours.  You have severe headaches that do not go away when you take medicine.  You have vision changes. Summary  The third trimester is from week 28 through week 40, months 7 through 9. The third trimester is a time when the unborn baby (fetus) is growing rapidly.  During the third trimester, your discomfort may increase as you and your baby continue to gain weight. You may have abdominal, leg, and back pain, sleeping problems, and an increased need to urinate.  During the third trimester your breasts will keep growing and they will continue to become tender. A yellow fluid (colostrum) may leak from your breasts. This is the first milk you are producing for your baby.  False labor is a condition in which you feel small, irregular tightenings of the muscles in the womb (contractions) that eventually go away. These are called Braxton Hicks contractions. Contractions may last for hours, days, or even weeks before true labor sets in.  Signs of labor can include: abdominal cramps; regular contractions that start at 10 minutes apart and become stronger and more frequent with time; watery or bloody mucus discharge that comes from the vagina; increased pelvic pressure and dull back pain; and leaking of amniotic fluid. This information is not intended to replace advice given to you by your  health care provider. Make sure you discuss any questions you have with your health care provider. Document Released: 01/08/2001 Document Revised: 02/20/2016 Document Reviewed: 02/20/2016 Elsevier Interactive Patient Education  2019 [employer]  

## 2018-03-26 ENCOUNTER — Ambulatory Visit (INDEPENDENT_AMBULATORY_CARE_PROVIDER_SITE_OTHER): Payer: Medicaid Other

## 2018-03-26 VITALS — BP 114/74 | HR 86 | Ht 67.0 in | Wt 207.0 lb

## 2018-03-26 DIAGNOSIS — O09213 Supervision of pregnancy with history of pre-term labor, third trimester: Secondary | ICD-10-CM | POA: Diagnosis not present

## 2018-03-26 DIAGNOSIS — O09899 Supervision of other high risk pregnancies, unspecified trimester: Secondary | ICD-10-CM

## 2018-03-26 DIAGNOSIS — O09219 Supervision of pregnancy with history of pre-term labor, unspecified trimester: Principal | ICD-10-CM

## 2018-03-26 NOTE — Progress Notes (Signed)
Presents for 17P, given in left arm, tolerated well.  Administrations This Visit    HYDROXYprogesterone Caproate SOAJ 275 mg    Admin Date 03/26/2018 Action Given Dose 275 mg Route Subcutaneous Administered By Maretta Bees, RMA

## 2018-04-02 ENCOUNTER — Ambulatory Visit (INDEPENDENT_AMBULATORY_CARE_PROVIDER_SITE_OTHER): Payer: Medicaid Other | Admitting: Obstetrics & Gynecology

## 2018-04-02 VITALS — BP 113/70 | HR 70 | Wt 202.0 lb

## 2018-04-02 DIAGNOSIS — O4703 False labor before 37 completed weeks of gestation, third trimester: Secondary | ICD-10-CM

## 2018-04-02 DIAGNOSIS — Z3A31 31 weeks gestation of pregnancy: Secondary | ICD-10-CM

## 2018-04-02 DIAGNOSIS — O09213 Supervision of pregnancy with history of pre-term labor, third trimester: Secondary | ICD-10-CM

## 2018-04-02 DIAGNOSIS — O0993 Supervision of high risk pregnancy, unspecified, third trimester: Secondary | ICD-10-CM

## 2018-04-02 DIAGNOSIS — O09899 Supervision of other high risk pregnancies, unspecified trimester: Secondary | ICD-10-CM

## 2018-04-02 DIAGNOSIS — O09219 Supervision of pregnancy with history of pre-term labor, unspecified trimester: Principal | ICD-10-CM

## 2018-04-02 DIAGNOSIS — O0990 Supervision of high risk pregnancy, unspecified, unspecified trimester: Secondary | ICD-10-CM

## 2018-04-02 MED ORDER — NIFEDIPINE ER OSMOTIC RELEASE 30 MG PO TB24: 30.0000 mg | ORAL_TABLET | Freq: Two times a day (BID) | ORAL | 2 refills | Status: DC

## 2018-04-02 NOTE — Progress Notes (Signed)
   PRENATAL VISIT NOTE  Subjective:  Taylor Buck is a 28 y.o. [phone] at [redacted]w[redacted]d being seen today for ongoing prenatal care.  She is currently monitored for the following issues for this high-risk pregnancy and has Supervision of high risk pregnancy, antepartum; History of preterm delivery, currently pregnant; Bipolar 1 disorder (HCC); History of depression; and H/O: substance abuse (HCC) on their problem list.  Patient reports having increased pelvic pressure and contractions (up to 4-5 per hour) for the past couple of weeks. Would like cervix check today.   Contractions: Irregular. Vag. Bleeding: None.  Movement: Present. Denies leaking of fluid.   The following portions of the patient's history were reviewed and updated as appropriate: allergies, current medications, past family history, past medical history, past social history, past surgical history and problem list. Problem list updated.  Objective:   Vitals:   04/02/18 0826  BP: 113/70  Pulse: 70  Weight: 202 lb (91.6 kg)    Fetal Status: Fetal Heart Rate (bpm): 145 Fundal Height: 31 cm Movement: Present     General:  Alert, oriented and cooperative. Patient is in no acute distress.  Skin: Skin is warm and dry. No rash noted.   Cardiovascular: Normal heart rate noted  Respiratory: Normal respiratory effort, no problems with respiration noted  Abdomen: Soft, gravid, appropriate for gestational age.  Pain/Pressure: Present     Pelvic: Cervical exam performed Dilation: Closed Effacement (%): Thick Station: Ballotable  Extremities: Normal range of motion.  Edema: None  Mental Status: Normal mood and affect. Normal behavior. Normal judgment and thought content.   Assessment and Plan:  Pregnancy: [id number] at [redacted]w[redacted]d  1. Preterm uterine contractions in third trimester, antepartum Closed cervix, reassured patient.  Procardia prescribed for symptomatic contractions; PTL precautions reviewed. - NIFEdipine  (PROCARDIA-XL/NIFEDICAL-XL) 30 MG 24 hr tablet; Take 1 tablet (30 mg total) by mouth 2 (two) times daily. As needed for symptomatic contractions  Dispense: 30 tablet; Refill: 2  2. History of preterm delivery, currently pregnant Continue weekly 17P.  3. Supervision of high risk pregnancy, antepartum Preterm labor symptoms and general obstetric precautions including but not limited to vaginal bleeding, contractions, leaking of fluid and fetal movement were reviewed in detail with the patient. Please refer to After Visit Summary for other counseling recommendations.  Return in about 1 week (around 04/09/2018) for 17P only 2 weeks: OB visit, 17P.   Jaynie Collins, MD

## 2018-04-02 NOTE — Patient Instructions (Signed)
Return to office for any scheduled appointments. Call the office or go to the MAU at [facility] [place] if:  You begin to have strong, frequent contractions  Your water breaks.  Sometimes it is a big gush of fluid, sometimes it is just a trickle that keeps getting your panties wet or running down your legs  You have vaginal bleeding.  It is normal to have a small amount of spotting if your cervix was checked.   You do not feel your baby moving like normal.  If you do not, get something to eat and drink and lay down and focus on feeling your baby move.   If your baby is still not moving like normal, you should call the office or go to MAU.  Any other obstetric concerns.   

## 2018-04-04 ENCOUNTER — Other Ambulatory Visit: Payer: Self-pay

## 2018-04-04 ENCOUNTER — Encounter (HOSPITAL_COMMUNITY): Payer: Self-pay | Admitting: *Deleted

## 2018-04-04 ENCOUNTER — Inpatient Hospital Stay (HOSPITAL_COMMUNITY)
Admission: AD | Admit: 2018-04-04 | Discharge: 2018-04-04 | Disposition: A | Discharge status: Home / Self Care | Payer: Medicaid Other | Source: Ambulatory Visit | Attending: Obstetrics & Gynecology | Admitting: Obstetrics & Gynecology

## 2018-04-04 DIAGNOSIS — O99512 Diseases of the respiratory system complicating pregnancy, second trimester: Secondary | ICD-10-CM | POA: Diagnosis not present

## 2018-04-04 DIAGNOSIS — O3463 Maternal care for abnormality of vagina, third trimester: Secondary | ICD-10-CM | POA: Diagnosis not present

## 2018-04-04 DIAGNOSIS — F17200 Nicotine dependence, unspecified, uncomplicated: Secondary | ICD-10-CM | POA: Insufficient documentation

## 2018-04-04 DIAGNOSIS — O4703 False labor before 37 completed weeks of gestation, third trimester: Secondary | ICD-10-CM | POA: Insufficient documentation

## 2018-04-04 DIAGNOSIS — O99343 Other mental disorders complicating pregnancy, third trimester: Secondary | ICD-10-CM | POA: Diagnosis not present

## 2018-04-04 DIAGNOSIS — O99333 Smoking (tobacco) complicating pregnancy, third trimester: Secondary | ICD-10-CM | POA: Insufficient documentation

## 2018-04-04 DIAGNOSIS — J45909 Unspecified asthma, uncomplicated: Secondary | ICD-10-CM | POA: Insufficient documentation

## 2018-04-04 DIAGNOSIS — F419 Anxiety disorder, unspecified: Secondary | ICD-10-CM | POA: Insufficient documentation

## 2018-04-04 DIAGNOSIS — Z3A31 31 weeks gestation of pregnancy: Secondary | ICD-10-CM

## 2018-04-04 DIAGNOSIS — Z79899 Other long term (current) drug therapy: Secondary | ICD-10-CM | POA: Diagnosis not present

## 2018-04-04 DIAGNOSIS — F329 Major depressive disorder, single episode, unspecified: Secondary | ICD-10-CM | POA: Diagnosis not present

## 2018-04-04 DIAGNOSIS — N898 Other specified noninflammatory disorders of vagina: Secondary | ICD-10-CM | POA: Diagnosis not present

## 2018-04-04 LAB — URINALYSIS, ROUTINE W REFLEX MICROSCOPIC
Bilirubin Urine: NEGATIVE
GLUCOSE, UA: NEGATIVE mg/dL
Hgb urine dipstick: NEGATIVE
Ketones, ur: NEGATIVE mg/dL
Nitrite: NEGATIVE
Protein, ur: NEGATIVE mg/dL
Specific Gravity, Urine: 1.004 — ABNORMAL LOW (ref 1.005–1.030)
pH: 8 (ref 5.0–8.0)

## 2018-04-04 LAB — POCT FERN TEST: POCT Fern Test: NEGATIVE

## 2018-04-04 NOTE — MAU Provider Note (Signed)
Chief Complaint:  Contractions   First Provider Initiated Contact with Patient 04/04/18 [phone]     HPI: Taylor Buck is a 28 y.o. [id number] at [redacted]w[redacted]d who presents to maternity admissions reporting contractions. Has been having contractions sporadically for the last few weeks. Was prescribed procardia prn by her ob on Thursday. As of Thursday her cervix was closed/thick. Ctx increased last night. Took the procardia around 730 pm yesterday. States has improved since then. Now is feeling contractions 3-4 times per hour. Also feels like her underwear have been damp since yesterday. Not trickling or gushing fluid. Denies vaginal bleeding. No recent intercourse. Hx of preterm delivery & is receiving Makena with this pregnancy. Normal fetal movement.   Location: abdomen Quality: contractions Severity: 5/10 in pain scale Duration: 12 hours Timing: 3-4 times per hour Modifying factors: none  Associated signs and symptoms: vaginal discharge   Past Medical History:  Diagnosis Date  . Anxiety   . Arthritis   . Asthma   . Depression   . Heart murmur   . Infection    chlamydia age 76  . PCOS (polycystic ovarian syndrome)   . Sleep apnea   . Urinary tract infection    OB History  Gravida Para Term Preterm AB Living  6 1 0 SAB TAB Ectopic Multiple Live Births  4       1    # Outcome Date GA Lbr Len/2nd Weight Sex Delivery Anes PTL Lv  6 Current           5 SAB 04/2017          4 Preterm 07/31/12 [redacted]w[redacted]d 07:00  M Vag-Spont None Y LIV  3 SAB           2 SAB           1 SAB            Past Surgical History:  Procedure Laterality Date  . HAND SURGERY     reconstruction- injury due to frostbite as child  . HERNIA REPAIR     rt inguinal   Family History  Problem Relation Age of Onset  . Diabetes Mother   . COPD Mother   . Diabetes Maternal Aunt   . Cancer Maternal Aunt        breast  . Diabetes Maternal Grandmother   . Cancer Maternal Grandmother        skin cancer   . Alcoholism Other   . Arthritis Other   . Asthma Other   . Depression Other   . Hearing loss Neg Hx    Social History   Tobacco Use  . Smoking status: Current Every Day Smoker    Packs/day: 0.25    Types: Cigarettes  . Smokeless tobacco: Never Used  . Tobacco comment: 1 PACK A DAY - 4 CIGARETTES A DAY   Substance Use Topics  . Alcohol use: No  . Drug use: Not Currently    Types: Marijuana    Comment: UP UNTIL +UPT   Allergies  Allergen Reactions  . Milk-Related Compounds Anaphylaxis  . Latex Hives  . Penicillins Hives    Has patient had a PCN reaction causing immediate rash, facial/tongue/throat swelling, SOB or lightheadedness with hypotension: Yes Has patient had a PCN reaction causing severe rash involving mucus membranes or skin necrosis: No Has patient had a PCN reaction that required hospitalization: No Has patient had a PCN reaction occurring within the last 10 years: No If  all of the above answers are "NO", then may proceed with Cephalosporin use.   . Reglan [Metoclopramide] Other (See Comments)    Panic attack   Facility-Administered Medications Prior to Admission  Medication Dose Route Frequency Provider Last Rate Last Dose  . HYDROXYprogesterone Caproate SOAJ 275 mg  275 mg Subcutaneous Weekly Constant, Peggy, MD   275 mg at 04/02/18 3570   Medications Prior to Admission  Medication Sig Dispense Refill Last Dose  . acetaminophen (TYLENOL) 325 MG tablet Take 650 mg by mouth daily as needed for pain.   Past Week at Unknown time  . calcium carbonate (TUMS EX) 750 MG chewable tablet Chew 1 tablet by mouth at bedtime as needed (for nausea).   Past Week at Unknown time  . NIFEdipine (PROCARDIA-XL/NIFEDICAL-XL) 30 MG 24 hr tablet Take 1 tablet (30 mg total) by mouth 2 (two) times daily. As needed for symptomatic contractions 30 tablet 2 04/03/2018 at 1900  . omeprazole (PRILOSEC) 20 MG capsule Take 1 capsule (20 mg total) by mouth daily. 60 capsule 5 Past Week at  Unknown time  . Prenatal Vit-Fe Phos-FA-Omega (VITAFOL GUMMIES) 3.33-0.333-34.8 MG CHEW Chew 3 tablets by mouth daily. 90 tablet 2 04/03/2018 at Unknown time  . sertraline (ZOLOFT) 25 MG tablet Take 1 tablet (25 mg total) by mouth daily. Take one 25 mg tab in addition to the 100 mg tab daily, to make 125 mg daily. (Patient taking differently: Take 100 mg by mouth daily. Take one 25 mg tab in addition to the 100 mg tab daily, to make 125 mg daily.PT states today 04/04/18 that she takes 100mg  Q AM) 30 tablet 2 04/03/2018 at Unknown time    I have reviewed patient's Past Medical Hx, Surgical Hx, Family Hx, Social Hx, medications and allergies.   ROS:  Review of Systems  Constitutional: Negative.   Gastrointestinal: Positive for abdominal pain. Negative for constipation, diarrhea, nausea and vomiting.  Genitourinary: Positive for vaginal discharge. Negative for dysuria and vaginal bleeding.    Physical Exam   Patient Vitals for the past 24 hrs:  BP Temp Pulse Resp Height Weight  04/04/18 0638 124/66 98.5 F (36.9 C) 81 18 5\' 7"  (1.702 m) 91.1 kg    Constitutional: Well-developed, well-nourished female in no acute distress.  Cardiovascular: normal rate & rhythm, no murmur Respiratory: normal effort, lung sounds clear throughout GI: Abd soft, non-tender, gravid appropriate for gestational age. Pos BS x 4 MS: Extremities nontender, no edema, normal ROM Neurologic: Alert and oriented x 4.  GU:      Pelvic: NEFG, physiologic discharge, no blood, cervix clean. No pooling  Dilation: Closed Effacement (%): Thick Cervical Position: Posterior Exam by:: Judeth Horn NP  NST:  Baseline: 145 bpm, Variability: Good {> 6 bpm), Accelerations: Non-reactive but appropriate for gestational age and Decelerations: Absent   Labs: Results for orders placed or performed during the hospital encounter of 04/04/18 (from the past 24 hour(s))  Urinalysis, Routine w reflex microscopic     Status: Abnormal    Collection Time: 04/04/18  7:00 AM  Result Value Ref Range   Color, Urine STRAW (A) YELLOW   APPearance CLEAR CLEAR   Specific Gravity, Urine 1.004 (L) 1.005 - 1.030   pH 8.0 5.0 - 8.0   Glucose, UA NEGATIVE NEGATIVE mg/dL   Hgb urine dipstick NEGATIVE NEGATIVE   Bilirubin Urine NEGATIVE NEGATIVE   Ketones, ur NEGATIVE NEGATIVE mg/dL   Protein, ur NEGATIVE NEGATIVE mg/dL   Nitrite NEGATIVE NEGATIVE   Leukocytes,Ua  TRACE (A) NEGATIVE   RBC / HPF 0-5 0 - 5 RBC/hpf   WBC, UA 0-5 0 - 5 WBC/hpf   Bacteria, UA RARE (A) NONE SEEN   Squamous Epithelial / LPF 0-5 0 - 5  POCT fern test     Status: None   Collection Time: 04/04/18  7:20 AM  Result Value Ref Range   POCT Fern Test Negative = intact amniotic membranes     Imaging:  No results found.  MAU Course: Orders Placed This Encounter  Procedures  . Culture, OB Urine  . Urinalysis, Routine w reflex microscopic  . POCT fern test  . Discharge patient   No orders of the defined types were placed in this encounter.   MDM: Some irregular UI on monitor, no regular contractions. Cervix closed/thick/posterior/firm SSE performed , no pooling of fluid & fern negative  Assessment: 1. Preterm uterine contractions in third trimester, antepartum   2. [redacted] weeks gestation of pregnancy     Plan: Discharge home in stable condition.  Preterm Labor precautions and fetal kick counts   Allergies as of 04/04/2018      Reactions   Milk-related Compounds Anaphylaxis   Latex Hives   Penicillins Hives   Has patient had a PCN reaction causing immediate rash, facial/tongue/throat swelling, SOB or lightheadedness with hypotension: Yes Has patient had a PCN reaction causing severe rash involving mucus membranes or skin necrosis: No Has patient had a PCN reaction that required hospitalization: No Has patient had a PCN reaction occurring within the last 10 years: No If all of the above answers are "NO", then may proceed with Cephalosporin use.    Reglan [metoclopramide] Other (See Comments)   Panic attack      Medication List    TAKE these medications   acetaminophen 325 MG tablet Commonly known as:  TYLENOL Take 650 mg by mouth daily as needed for pain.   calcium carbonate 750 MG chewable tablet Commonly known as:  TUMS EX Chew 1 tablet by mouth at bedtime as needed (for nausea).   NIFEdipine 30 MG 24 hr tablet Commonly known as:  PROCARDIA-XL/NIFEDICAL-XL Take 1 tablet (30 mg total) by mouth 2 (two) times daily. As needed for symptomatic contractions   omeprazole 20 MG capsule Commonly known as:  PriLOSEC Take 1 capsule (20 mg total) by mouth daily.   sertraline 25 MG tablet Commonly known as:  Zoloft Take 1 tablet (25 mg total) by mouth daily. Take one 25 mg tab in addition to the 100 mg tab daily, to make 125 mg daily. What changed:    how much to take  additional instructions   Vitafol Gummies 3.33-0.333-34.8 MG Chew Chew 3 tablets by mouth daily.       Judeth Horn, NP 04/04/2018 7:47 AM

## 2018-04-04 NOTE — MAU Note (Signed)
Started having painful ctxs 1930. Took med given to me THurs for ctxs and they go better. Have had some ctxs during the night. At 0500 ctxs stronger. Tried drinking water but did not help. Denies vag bleeding but I "feel like my pants are wet but no gushes of water"

## 2018-04-04 NOTE — Discharge Instructions (Signed)
Fetal Movement Counts °Patient Name: ________________________________________________ Patient Due Date: ____________________ °What is a fetal movement count? ° °A fetal movement count is the number of times that you feel your baby move during a certain amount of time. This may also be called a fetal kick count. A fetal movement count is recommended for every pregnant woman. You may be asked to start counting fetal movements as early as week 28 of your pregnancy. °Pay attention to when your baby is most active. You may notice your baby's sleep and wake cycles. You may also notice things that make your baby move more. You should do a fetal movement count: °· When your baby is normally most active. °· At the same time each day. °A good time to count movements is while you are resting, after having something to eat and drink. °How do I count fetal movements? °1. Find a quiet, comfortable area. Sit, or lie down on your side. °2. Write down the date, the start time and stop time, and the number of movements that you felt between those two times. Take this information with you to your health care visits. °3. For 2 hours, count kicks, flutters, swishes, rolls, and jabs. You should feel at least 10 movements during 2 hours. °4. You may stop counting after you have felt 10 movements. °5. If you do not feel 10 movements in 2 hours, have something to eat and drink. Then, keep resting and counting for 1 hour. If you feel at least 4 movements during that hour, you may stop counting. °Contact a health care provider if: °· You feel fewer than 4 movements in 2 hours. °· Your baby is not moving like he or she usually does. °Date: ____________ Start time: ____________ Stop time: ____________ Movements: ____________ °Date: ____________ Start time: ____________ Stop time: ____________ Movements: ____________ °Date: ____________ Start time: ____________ Stop time: ____________ Movements: ____________ °Date: ____________ Start time:  ____________ Stop time: ____________ Movements: ____________ °Date: ____________ Start time: ____________ Stop time: ____________ Movements: ____________ °Date: ____________ Start time: ____________ Stop time: ____________ Movements: ____________ °Date: ____________ Start time: ____________ Stop time: ____________ Movements: ____________ °Date: ____________ Start time: ____________ Stop time: ____________ Movements: ____________ °Date: ____________ Start time: ____________ Stop time: ____________ Movements: ____________ °This information is not intended to replace advice given to you by your health care provider. Make sure you discuss any questions you have with your health care provider. °Document Released: 02/13/2006 Document Revised: 09/13/2015 Document Reviewed: 02/23/2015 °Elsevier Interactive Patient Education © 2019 [employer] °Warning Signs During Pregnancy °A pregnancy lasts about 40 weeks, starting from the first day of your last period until the baby is born. Pregnancy is divided into three phases called trimesters. °· The first trimester refers to week 1 through week 13 of pregnancy. °· The second trimester is the start of week 14 through the end of week 27. °· The third trimester is the start of week 28 until you deliver your baby. °During each trimester of pregnancy, certain signs and symptoms may indicate a problem. Talk with your health care provider about your current health and any medical conditions you have. Make sure you know the symptoms that you should watch for and report. °How does this affect me? ° °Warning signs in the first trimester °While some changes during the first trimester may be uncomfortable, most do not represent a serious problem. Let your health care provider know if you have any of the following warning signs in the first trimester: °· You cannot   eat or drink without vomiting, and this lasts for longer than a day. °· You have vaginal bleeding or spotting along with  menstrual-like cramping. °· You have diarrhea for longer than a day. °· You have a fever or other signs of infection, such as: °? Pain or burning when you urinate. °? Foul smelling or thick or yellowish vaginal discharge. °Warning signs in the second trimester °As your baby grows and changes during the second trimester, there are additional signs and symptoms that may indicate a problem. These include: °· Signs and symptoms of infection, including a fever. °· Signs or symptoms of a miscarriage or preterm labor, such as regular contractions, menstrual-like cramping, or lower abdominal pain. °· Bloody or watery vaginal discharge or obvious vaginal bleeding. °· Feeling like your heart is pounding. °· Having trouble breathing. °· Nausea, vomiting, or diarrhea that lasts for longer than a day. °· Craving non-food items, such as clay, chalk, or dirt. This may be a sign of a very treatable medical condition called pica. °Later in your second trimester, watch for signs and symptoms of a serious medical condition called preeclampsia.These include: °· Changes in your vision. °· A severe headache that does not go away. °· Nausea and vomiting. °It is also important to notice if your baby stops moving or moves less than usual during this time. °Warning signs in the third trimester °As you approach the third trimester, your baby is growing and your body is preparing for the birth of your baby. In your third trimester, be sure to let your health care provider know if: °· You have signs and symptoms of infection, including a fever. °· You have vaginal bleeding. °· You notice that your baby is moving less than usual or is not moving. °· You have nausea, vomiting, or diarrhea that lasts for longer than a day. °· You have a severe headache that does not go away. °· You have vision changes, including seeing spots or having blurry or double vision. °· You have increased swelling in your hands or face. °How does this affect my  baby? °Throughout your pregnancy, always report any of the warning signs of a problem to your health care provider. This can help prevent complications that may affect your baby, including: °· Increased risk for premature birth. °· Infection that may be transmitted to your baby. °· Increased risk for stillbirth. °Contact a health care provider if: °· You have any of the warning signs of a problem for the current trimester of your pregnancy. °· Any of the following apply to you during any trimester of pregnancy: °? You have strong emotions, such as sadness or anxiety, that interfere with work or personal relationships. °? You feel unsafe in your home and need help finding a safe place to live. °? You are using tobacco products, alcohol, or drugs and you need help to stop. °Get help right away if: °You have signs or symptoms of labor before 37 weeks of pregnancy. These include: °· Contractions that are 5 minutes or less apart, or that increase in frequency, intensity, or length. °· Sudden, sharp abdominal pain or low back pain. °· Uncontrolled gush or trickle of fluid from your vagina. °Summary °· A pregnancy lasts about 40 weeks, starting from the first day of your last period until the baby is born. Pregnancy is divided into three phases called trimesters. Each trimester has warning signs to watch for. °· Always report any warning signs to your health care provider in order to   prevent complications that may affect both you and your baby. °· Talk with your health care provider about your current health and any medical conditions you have. Make sure you know the symptoms that you should watch for and report. °This information is not intended to replace advice given to you by your health care provider. Make sure you discuss any questions you have with your health care provider. °Document Released: 10/31/2016 Document Revised: 10/31/2016 Document Reviewed: 10/31/2016 °Elsevier Interactive Patient Education © 2019  [employer] ° °

## 2018-04-05 LAB — CULTURE, OB URINE: Culture: <10000 — AB

## 2018-04-10 ENCOUNTER — Other Ambulatory Visit: Payer: Self-pay

## 2018-04-10 ENCOUNTER — Ambulatory Visit (INDEPENDENT_AMBULATORY_CARE_PROVIDER_SITE_OTHER): Payer: Medicaid Other | Admitting: *Deleted

## 2018-04-10 VITALS — BP 115/72 | HR 75 | Wt 200.0 lb

## 2018-04-10 DIAGNOSIS — O09213 Supervision of pregnancy with history of pre-term labor, third trimester: Secondary | ICD-10-CM | POA: Diagnosis not present

## 2018-04-10 DIAGNOSIS — O0990 Supervision of high risk pregnancy, unspecified, unspecified trimester: Secondary | ICD-10-CM

## 2018-04-10 NOTE — Progress Notes (Signed)
Pt is in office for 17p injection today. Pt is doing well with no complaints today.  BP 115/72   Pulse 75   Wt 200 lb (90.7 kg)   LMP 08/25/2017   BMI 31.32 kg/m    Administrations This Visit    HYDROXYprogesterone Caproate SOAJ 275 mg    Admin Date 04/10/2018 Action Given Dose 275 mg Route Subcutaneous Administered By Lanney Gins, CMA

## 2018-04-16 ENCOUNTER — Ambulatory Visit: Payer: Medicaid Other

## 2018-04-16 ENCOUNTER — Encounter: Payer: Self-pay | Admitting: Obstetrics and Gynecology

## 2018-04-16 ENCOUNTER — Ambulatory Visit (INDEPENDENT_AMBULATORY_CARE_PROVIDER_SITE_OTHER): Payer: Medicaid Other | Admitting: Obstetrics and Gynecology

## 2018-04-16 ENCOUNTER — Other Ambulatory Visit (HOSPITAL_COMMUNITY)
Admission: RE | Admit: 2018-04-16 | Discharge: 2018-04-16 | Disposition: A | Discharge status: Home / Self Care | Payer: Medicaid Other | Source: Ambulatory Visit | Attending: Obstetrics and Gynecology | Admitting: Obstetrics and Gynecology

## 2018-04-16 ENCOUNTER — Other Ambulatory Visit: Payer: Self-pay

## 2018-04-16 VITALS — BP 110/72 | HR 88 | Wt 204.5 lb

## 2018-04-16 DIAGNOSIS — N898 Other specified noninflammatory disorders of vagina: Secondary | ICD-10-CM

## 2018-04-16 DIAGNOSIS — O26899 Other specified pregnancy related conditions, unspecified trimester: Secondary | ICD-10-CM | POA: Insufficient documentation

## 2018-04-16 DIAGNOSIS — Z113 Encounter for screening for infections with a predominantly sexual mode of transmission: Secondary | ICD-10-CM

## 2018-04-16 DIAGNOSIS — O09213 Supervision of pregnancy with history of pre-term labor, third trimester: Secondary | ICD-10-CM

## 2018-04-16 DIAGNOSIS — O09899 Supervision of other high risk pregnancies, unspecified trimester: Secondary | ICD-10-CM

## 2018-04-16 DIAGNOSIS — Z8659 Personal history of other mental and behavioral disorders: Secondary | ICD-10-CM

## 2018-04-16 DIAGNOSIS — O26893 Other specified pregnancy related conditions, third trimester: Secondary | ICD-10-CM

## 2018-04-16 DIAGNOSIS — O09219 Supervision of pregnancy with history of pre-term labor, unspecified trimester: Secondary | ICD-10-CM

## 2018-04-16 DIAGNOSIS — Z3A33 33 weeks gestation of pregnancy: Secondary | ICD-10-CM

## 2018-04-16 DIAGNOSIS — O0990 Supervision of high risk pregnancy, unspecified, unspecified trimester: Secondary | ICD-10-CM

## 2018-04-16 MED ORDER — HYDROXYPROGESTERONE CAPROATE 275 MG/1.1ML ~~LOC~~ SOAJ
275.0000 mg | Freq: Once | SUBCUTANEOUS | Status: AC
  Administered 2018-04-16: 275 mg via SUBCUTANEOUS

## 2018-04-16 NOTE — Progress Notes (Signed)
Pt is here for ROB/17p. [redacted]w[redacted]d.

## 2018-04-16 NOTE — Progress Notes (Signed)
   PRENATAL VISIT NOTE  Subjective:  Taylor Buck is a 28 y.o. [phone] at [redacted]w[redacted]d being seen today for ongoing prenatal care.  She is currently monitored for the following issues for this high-risk pregnancy and has Supervision of high risk pregnancy, antepartum; History of preterm delivery, currently pregnant; Bipolar 1 disorder (HCC); History of depression; and H/O: substance abuse (HCC) on their problem list.  Patient reports feeling overwhelmed with the covid-19 pandemic and desires STI screening.  Contractions: Irritability. Vag. Bleeding: None.  Movement: Present. Denies leaking of fluid.   The following portions of the patient's history were reviewed and updated as appropriate: allergies, current medications, past family history, past medical history, past social history, past surgical history and problem list.   Objective:   Vitals:   04/16/18 0944  BP: 110/72  Pulse: 88  Weight: 204 lb 8 oz (92.8 kg)    Fetal Status: Fetal Heart Rate (bpm): 148 Fundal Height: 33 cm Movement: Present     General:  Alert, oriented and cooperative. Patient is in no acute distress.  Skin: Skin is warm and dry. No rash noted.   Cardiovascular: Normal heart rate noted  Respiratory: Normal respiratory effort, no problems with respiration noted  Abdomen: Soft, gravid, appropriate for gestational age.  Pain/Pressure: Present     Pelvic: Cervical exam deferred        Extremities: Normal range of motion.  Edema: None  Mental Status: Normal mood and affect. Normal behavior. Normal judgment and thought content.   Assessment and Plan:  Pregnancy: [id number] at [redacted]w[redacted]d 1. Vaginal discharge during pregnancy, antepartum - Cervicovaginal ancillary only( [place])  2. Supervision of high risk pregnancy, antepartum Patient is doing well She recently discovered that her partner has been unfaithful and is requesting STI  She states that she has not had any further episodes of preterm contractions  since her hospital visit and has not taken procardia Questions regarding Covid-19 answered - HYDROXYprogesterone Caproate SOAJ 275 mg  3. Screen for STD (sexually transmitted disease) STI screen per patient request - Hepatitis B surface antigen - Hepatitis C antibody - HIV Antibody (routine testing w rflx) - RPR  4. History of depression Continue zoloft at 125 mg daily. Patient declined an increase in dosage She agrees to meet with integrated behavioral health clinician  5. History of preterm delivery, currently pregnant Continue weekly-17P  Preterm labor symptoms and general obstetric precautions including but not limited to vaginal bleeding, contractions, leaking of fluid and fetal movement were reviewed in detail with the patient. Please refer to After Visit Summary for other counseling recommendations.   No follow-ups on file.  Future Appointments  Date Time Provider Department Center  04/23/2018  9:30 AM [facility] NURSE [facility] None  04/23/2018 10:30 AM [facility]    Catalina Antigua, MD

## 2018-04-17 LAB — HEPATITIS C ANTIBODY

## 2018-04-17 LAB — RPR: RPR Ser Ql: NONREACTIVE

## 2018-04-17 LAB — HIV ANTIBODY (ROUTINE TESTING W REFLEX): HIV Screen 4th Generation wRfx: NONREACTIVE

## 2018-04-17 LAB — HEPATITIS B SURFACE ANTIGEN: HEP B S AG: NEGATIVE

## 2018-04-18 LAB — CERVICOVAGINAL ANCILLARY ONLY
Bacterial vaginitis: NEGATIVE
Candida vaginitis: NEGATIVE
Chlamydia: NEGATIVE
Neisseria Gonorrhea: NEGATIVE
Trichomonas: NEGATIVE

## 2018-04-20 ENCOUNTER — Telehealth: Payer: Self-pay

## 2018-04-20 MED ORDER — CEPHALEXIN 500 MG PO CAPS: 500.0000 mg | ORAL_CAPSULE | Freq: Three times a day (TID) | ORAL | 0 refills | Status: DC

## 2018-04-20 NOTE — Addendum Note (Signed)
Addended by: Catalina Antigua on: 04/20/2018 12:55 PM   Modules accepted: Orders

## 2018-04-20 NOTE — Telephone Encounter (Signed)
Patient c/o UTI symptoms requesting Rx  Needs Rx sent to [facility] on [address]  Please advise

## 2018-04-20 NOTE — Telephone Encounter (Signed)
Pt notified and voiced understanding 

## 2018-04-23 ENCOUNTER — Ambulatory Visit (INDEPENDENT_AMBULATORY_CARE_PROVIDER_SITE_OTHER): Payer: Medicaid Other | Admitting: Clinical

## 2018-04-23 ENCOUNTER — Ambulatory Visit: Payer: Medicaid Other

## 2018-04-23 ENCOUNTER — Other Ambulatory Visit: Payer: Self-pay

## 2018-04-23 DIAGNOSIS — Z658 Other specified problems related to psychosocial circumstances: Secondary | ICD-10-CM | POA: Diagnosis not present

## 2018-04-23 DIAGNOSIS — F319 Bipolar disorder, unspecified: Secondary | ICD-10-CM | POA: Diagnosis not present

## 2018-04-23 NOTE — Patient Instructions (Addendum)
Behavioral Health Resources:   What if I or someone I know is in crisis?   If you are thinking about harming yourself or having thoughts of suicide, or if you know someone who is, seek help right away.   Call your doctor or mental health care provider.   Call 911 or go to a hospital emergency room to get immediate help, or ask a friend or family member to help you do these things.   Call the [place] National Suicide Prevention Lifelines toll-free, 24-hour hotline at 1-800-273-TALK 236-613-3429) or TTY: (808)633-7967 TTY 520-532-9189) to talk to a trained counselor.   If you are in crisis, make sure you are not left alone.    If someone else is in crisis, make sure he or she is not left alone   24 Hour :   [place] National Suicide Hotline: 573-339-1327  Therapeutic Alternative Mobile Crisis: 513-656-5145   [facility]  [address], [place], [place] [address]  [phone] or [phone]  [facility] [employer] (Domestic Violence, Rape & Victim Assistance)  [phone]  [place]  [address], [place]  [address]   [phone] or [phone]   [place]: [phone] (8am-4pm) or [phone] (after hours)           [facility], [address] [place], [place] Fax: [phone] [email or web address]  *Interpreters available *Accepts Medicaid, Medicare, uninsured  [facility]   Mon-Fri: 8am-5pm [address] [place], [place] [phone](phone); [phone]) [email or web address]  *Accepts Medicare  [facility] [place], Fri: 8am-4pm [address] [place], [place] (phone); [phone] (fax) TaskTown.es  *[place] Mon-Fri: 9am-5pm  [address] [place], [place] (phone); [phone]  [email or web address]  *Accepts Medicaid  [facility] [address] [place], [place]  [email or web address].[email or web address]   [facility] [place], 8:30am-12pm/1pm-2:30pm [address], [place], [place] (phone); [phone] (fax) www.[email or web address]  *Accepts Medicaid, sliding-scale*Bilingual services available  [facility] Mon-Fri, 8am-7pm [place], [place]  [phone](phone); [phone]) www.[email or web address]  *Accepts Medicaid *Bilingual services available  [facility] Mon-Fri: 8am-5pm, Saturday by appointment only [place], [place], [place] (phone); [phone] (fax) [email or web address]   [facility] [address], [place], [place], [place] [email or web address]  *Free & reduced services for uninsured and underinsured individuals *Bilingual services for Spanish-speaking clients 21 and under  [facility], [address] [place], [place]); [phone]) [email or web address].[email or web address]  *Bring your own interpreter at first visit *Accepts Medicare and [facility]  [place] Mon-Fri: 9am-5:30pm [address] [place], [place], [place] (phone), [phone] (fax) After hours crisis line: 2106355360 www.[email or web address]  *Accepts Medicare and Medicaid  [facility], 8am-6pm [address], [place], [place] (phone); [phone] (fax) [email or web address]://[email or web address]  *Subsidized costs available  [facility] Mon-Fri: 8am-4pm [address] [place],  [place] [phone](phone); [phone]) [email or web address]  *Accepts Medicaid  [facility] Same day access hours: Mon-Fri, 8:30-3pm Crisis hours: Mon-Fri, 8am-5pm [place], [place] Same day access hours: Mon-Fri, 8:30-3pm Crisis hours: Mon-Fri, 8am-8pm [address], [place], [place] (phone); [phone] (fax) www.[email or web address]  *Accepts Medicaid and Medicare  [facility], [place], Fri: 9am-9pm Tues, Thurs: 9am-6pm [address]  [place], [place] (phone); [phone] (fax) [email or web address]  *(Accepts Medicare and Medicaid; payment plans available)*Bilingual services available  [facility] [address] [place], [place] (phone); [phone] (fax) [email or web address] [address], [place], [place], [place]  [email or web address]  *Bilingual services available  [facility]) Mon-Thurs: 8am-8pm [address], [place], [place], [place] (phone); [phone] (fax) [email or web address]  *Accepts Medicaid*Bilingual services available  [facility] [address], [place], [place], [place] (phone) [email or web address]  *Accepts Medicaid *Bilingual services available  [facility] Mon-Fri, 9am-4:45pm [address], [place], [place] (phone); [phone] (fax) [email or web address]  *Accepts Medicare  [facility] Mon-Thurs: 8:30-8pm, Fri: 8:30am-7pm [address], [place], [place] (3rd floor) [phone] (phone); [phone] (fax) [email or web address]  *Accepts Medicaid; income-based reduced rates available  [facility] Mon-Fri: 8am-5pm [address], [place], [place],  [place] (phone); [phone] (fax) [email or web address]  *Accepts Medicaid*Bilingual services available  [place], [place], [place], [place] (phone); [phone] (fax) www.[email or web address]  *Free emergency housing and clinical services for youth in crisis  [facility] ([place])  [address] [place] [address] [place] [phone] www.[email or web address]  *Provides direct services to individuals in recovery from mental illness, including support groups, recovery skills classes, and one on one peer support  [employer] on [place]) [place] helpline: 5620484681  https://[email or web address]  *A community hub for information relating to local resources and services for the friends and families of individuals living alongside a mental health condition, as well as the individuals themselves. Classes and support groups also provided    /Emotional [employer] and Websites Here are a few free apps meant to help you to help yourself.  To find, try searching on the internet to see if the app is offered on Apple/Android devices. If your first choice doesn't come up on your device, the good news is that there are many choices! Play around with different apps to see which ones are helpful to you.    Calm This is an app meant to help increase calm feelings. Includes info, strategies, and tools for tracking your feelings.      Calm Harm  This app is meant to help with self-harm. Provides many 5-minute or 15-min coping strategies for doing instead of hurting yourself.       Cannon Ball is a problem-solving tool to help deal with emotions and cope with stress you encounter wherever you are.      MindShift This app can help people cope with anxiety. Rather than trying to avoid anxiety, you can make an important shift and face it.      MY3  MY3 features a support system, safety plan and  resources with the goal of offering a tool to use in a time of need.       My Life My Voice  This mood journal offers a simple solution for tracking your thoughts, feelings and moods. Animated emoticons can help identify your mood.       Relax Melodies Designed to help with sleep, on this app you can mix sounds and meditations for relaxation.      Smiling Mind Smiling Mind is meditation made easy: it's a simple tool that helps put a smile on your mind.        Stop, Breathe & Think  A friendly, simple guide for people through meditations for mindfulness and compassion.  Stop, Breathe and Think Kids Enter your current feelings and choose a mission to help you cope. Offers videos for certain moods instead of just sound recordings.       Team Orange The goal of this tool is to help teens change how they think, act, and react. This app helps you focus on your own good feelings and experiences.      The [place] Box The [place] Box (VHB) contains simple tools to help patients with coping, relaxation, distraction, and positive thinking.       BRAINSTORMING  Develop a Plan Goals:  Provide a way to start conversation about your new life with a baby  Assist parents in recognizing and using resources within their reach  Help pave the way before birth for an easier period of transition afterwards.  Make a list of the following information to keep in a central location:  Full name of Mom and Partner: _____________________________________________  33 full name and Date of Birth: ___________________________________________  Home Address: ___________________________________________________________ ________________________________________________________________________  Home Phone: ____________________________________________________________  Parents' cell numbers:  _____________________________________________________ ________________________________________________________________________  Name and contact info for OB: ______________________________________________  Name and contact info for Pediatrician:________________________________________  Contact info for Lactation Consultants: ________________________________________  REST and SLEEP *You each need at least 4-5 hours of uninterrupted sleep every day. Write specific names and contact information.*  How are you going to rest in the postpartum period? While partner's home? When partner returns to work? When you both return to work?  Where will your baby sleep?  Who is available to help during the day? Evening? Night?  Who could move in for a period to help support you?  What are some ideas to help you get enough sleep? __________________________________________________________________________________________________________________________________________________________________________________________________________________________________________ NUTRITIOUS FOOD AND DRINK *Plan for meals before your baby is born so you can have healthy food to eat during the immediate postpartum period.*  Who will look after breakfast? Lunch? Dinner? List names and contact information. Brainstorm quick, healthy ideas for each meal.  What can you do before baby is born to prepare meals for the postpartum period?  How can others help you with meals?  Which grocery stores provide online shopping and delivery?  Which restaurants offer take-out or delivery  options? ______________________________________________________________________________________________________________________________________________________________________________________________________________________________________________________________________________________________________________________________________________________________________________________________________  CARE FOR MOM *It's important that mom is cared for and pampered in the postpartum period. Remember, the most important ways new mothers need care are: sleep, nutrition, gentle exercise, and time off.*  Who can come take care of mom during this period? Make a list of people with their contact information.  List some activities that make you feel cared for, rested, and energized? Who can make sure you have opportunities to do these things?  Does mom have a space of her very own within your home that's just for her? Make a [facility] where she can be comfortable, rest, and renew herself daily. ______________________________________________________________________________________________________________________________________________________________________________________________________________________________________________________________________________________________________________________________________________________________________________________________________    CARE FOR AND FEEDING BABY *Knowledgeable and encouraging people will offer the best support with regard to feeding your baby.*  Educate yourself and choose the best feeding option for your baby.  Make a list of people who will guide, support, and be a resource for you as your care for and feed your baby. (Friends that have breastfed or are currently breastfeeding, lactation consultants, breastfeeding support groups, etc.)  Consider a postpartum doula. (These websites can give you information: dona.org & BuyingShow.es)  Seek out  local breastfeeding resources like the breastfeeding support group at [employer] or [employer]. ______________________________________________________________________________________________________________________________________________________________________________________________________________________________________________________________________________________________________________________________________________________________________________________________________  Verner Chol AND ERRANDS  Who can help with a thorough cleaning before baby is born?  Make a list of people who will help with housekeeping and chores, like laundry, light cleaning, dishes, bathrooms, etc.  Who can run some errands for you?  What can you do to make sure you are stocked with basic supplies before baby is born?  Who is going to do the shopping? ______________________________________________________________________________________________________________________________________________________________________________________________________________________________________________________________________________________________________________________________________________________________________________________________________     Family Adjustment *Nurture yourselvesit helps parents be more loving and allows for better bonding with their child.*  What sorts of things do you and partner enjoy doing together? Which activities help you to connect and strengthen your relationship? Make a list of those things. Make a list of people whom you trust to care for your baby so you can have some time together as a couple.  What types of things help partner feel connected to Mom? Make a list.  What needs will partner have in order to bond with baby?  Other children? Who will care for them when you go into labor and while you are in the hospital?  Think about what the needs of your older children might be.  Who can help you meet those needs? In what ways are you helping them prepare for bringing baby home? List some specific strategies you have for family adjustment. _______________________________________________________________________________________________________________________________________________________________________________________________________________________________________________________________________________________________________________________________________________  SUPPORT *Someone who can empathize with experiences normalizes your problems and makes them more bearable.*  Make a list of other friends, neighbors, and/or co-workers you know with infants (and small children, if applicable) with whom you can connect.  Make a list of local or online support groups, mom groups, etc. in which you can be involved. ______________________________________________________________________________________________________________________________________________________________________________________________________________________________________________________________________________________________________________________________________________________________________________________________________  Childcare Plans  Investigate and plan for childcare if mom is returning to work.  Talk about mom's concerns about her transition back to work.  Talk about partner's concerns regarding this transition.  Mental Health *Your mental health is one of the highest priorities for a pregnant or postpartum mom.*  1 in 5 women experience anxiety and/or depression from the time of conception through the first year after birth.  Postpartum Mood Disorders are the #1 complication of pregnancy and childbirth and the suffering experienced by these mothers is not necessary! These illnesses are temporary and respond well to treatment, which often includes self-care, social support, talk therapy,  and medication when needed.  Women experiencing anxiety and depression often say things like: I'm supposed to be happywhy do I feel so sad?, Why can't I snap out of it?, I'm having thoughts that scare me.  There is no need to be embarrassed if you are feeling these symptoms: o Overwhelmed, anxious, angry, sad, guilty, irritable, hopeless, exhausted but can't sleep o You are NOT alone. You are NOT to blame. With help, you WILL be well.  Where can I find help? Medical professionals such as your OB, midwife, gynecologist, family practitioner, primary care provider, pediatrician, or mental health providers; [facility] support groups: Feelings After Birth, Breastfeeding Support Group, Baby and Me Group, and Fit 4 Two exercise classes.  You have permission to ask for help. It will confirm your feelings, validate your experiences, share/learn coping strategies, and gain support and encouragement as you heal. You are important! BRAINSTORM  Make a list of local resources, including resources for mom and for partner.  Identify support groups.  Identify people to call late at night - include names and contact info.  Talk with partner about perinatal mood and anxiety  disorders.  Talk with your OB, midwife, and doula about baby blues and about perinatal mood and anxiety disorders.  Talk with your pediatrician about perinatal mood and anxiety disorders.   Support & Sanity Savers   What do you really need?   Basics  In preparing for a new baby, many expectant parents spend hours shopping for baby clothes, decorating the nursery, and deciding which car seat to buy. Yet most don't think much about what the reality of parenting a newborn will be like, and what they need to make it through that. So, here is the advice of experienced parents. We know you'll read this, and think they're exaggerating, I don't really need that. Just trust [place] on these, OK? Plan for all of this, and if it turns  out you don't need it, come back and teach [place] how you did it!   Must-Haves (Once baby's survival needs are met, make sure you attend to your own survival needs!)  Sleep  An average newborn sleeps 16-18 hours per day, over 6-7 sleep periods, rarely more than three hours at a time. It is normal and healthy for a newborn to wake throughout the night... but really hard on parents!!  Naps. Prioritize sleep above any responsibilities like: cleaning house, visiting friends, running errands, etc.  Sleep whenever baby sleeps. If you can't nap, at least have restful times when baby eats. The more rest you get, the more patient you will be, the more emotionally stable, and better at solving problems.   Food  You may not have realized it would be difficult to eat when you have a newborn. Yet, when we talk to  countless new parents, they say things like it may be 2:00 pm when I realize I haven't had breakfast yet. Or every time we sit down to dinner, baby needs to eat, and my food gets cold, so I don't bother to eat it.  Finger food. Before your baby is born, stock up with one months' worth of food that: 1) you can eat with one hand while holding a baby, 2) doesn't need to be prepped, 3) is good hot or cold, 4) doesn't spoil when left out for a few hours, and 5) you like to eat. Think about: nuts, dried fruit, Clif bars, pretzels, jerky, gogurt, baby carrots, apples, bananas, crackers, cheez-n-crackers, string cheese, hot pockets or frozen burritos to microwave, garden burgers and breakfast pastries to put in the toaster, yogurt drinks, etc.  Restaurant Menus. Make lists of your favorite restaurants & menu items. When family/friends want to help, you can give specific information without much thought. They can either bring you the food or send gift cards for just the right meals.  Freezer Meals.  Take some time to make a few meals to put in the freezer ahead of time.  Easy to freeze meals can be  anything such as soup, lasagna, chicken pie, or spaghetti sauce.  Set up a Meal Schedule.  Ask friends and family to sign up to bring you meals during the first few weeks of being home. (It can be passed around at baby showers!) You have no idea how helpful this will be until you are in the throes of parenting.  https://hamilton-[email or web address] is a great website to check out.  Emotional Support  Know who to call when you're stressed out. Parenting a newborn is very challenging work. There are times when it totally overwhelms your normal coping abilities. EVERY NEW PARENT NEEDS TO HAVE A  PLAN FOR WHO TO CALL WHEN THEY JUST CAN'T COPE ANY MORE. (And it has to be someone other than the baby's other parent!) Before your baby is born, come up with at least one person you can call for support - write their phone number down and post it on the refrigerator.  Anxiety & Sadness. Baby blues are normal after pregnancy; however, there are more severe types of anxiety & sadness which can occur and should not be ignored.  They are always treatable, but you have to take the first step by reaching out for help. [facility] offers a Mom Talk group which meets every Tuesday from 10 am - 11 am.  This group is for new moms who need support and connection after their babies are born.  Call [phone].   Really, Really Helpful (Plan for them! Make sure these happen often!!)  Physical Support with Taking Care of Yourselves  Asking friends and family. Before your baby is born, set up a schedule of people who can come and visit and help out (or ask a friend to schedule for you). Any time someone says let me know what I can do to help, sign them up for a day. When they get there, their job is not to take care of the baby (that's your job and your joy). Their job is to take care of you!   Postpartum doulas. If you don't have anyone you can call on for support, look into postpartum doulas:  professionals at helping parents  with caring for baby, caring for themselves, getting breastfeeding started, and helping with household tasks. www.padanc.org is a helpful website for learning about doulas in our area.  Peer Support / Parent Groups  Why: One of the greatest ideas for new parents is to be around other new parents. Parent groups give you a chance to share and listen to others who are going through the same season of life, get a sense of what is normal infant development by watching several babies learn and grow, share your stories of triumph and struggles with empathetic ears, and forgive your own mistakes when you realize all parents are learning by trial and error.  Where to find: There are many places you can meet other new parents throughout our community.  [facility] offers the following classes for new moms and their little ones:  Baby and Me (Birth to [place]) and Breastfeeding Support Group. Go to [email or web address] or call [phone] for more information.  Time for your Relationship  It's easy to get so caught up in meeting baby's immediate needs that it's hard to find time to connect with your partner, and meet the needs of your relationship. It's also easy to forget what quality time with your partner actually looks like. If you take your baby on a date, you'd be amazed how much of your couple time is spent feeding the baby, diapering the baby, admiring the baby, and talking about the baby.  Dating: Try to take time for just the two of you. Babysitter tip: Sometimes when moms are breastfeeding a newborn, they find it hard to figure out how to schedule outings around baby's unpredictable feeding schedules. Have the babysitter come for a three hour period. When she comes over, if baby has just eaten, you can leave right away, and come back in two hours. If baby hasn't fed recently, you start the date at home. Once baby gets hungry and gets a good feeding in, you can head out for  the rest of your  date time.  Date Nights at Home: If you can't get out, at least set aside one evening a week to prioritize your relationship: whenever baby dozes off or doesn't have any immediate needs, spend a little time focusing on each other.  Potential conflicts: The main relationship conflicts that come up for new parents are: issues related to sexuality, financial stresses, a feeling of an unfair division of household tasks, and conflicts in parenting styles. The more you can work on these issues before baby arrives, the better!   Fun and Frills (Don't forget these and don't feel guilty for indulging in them!)  Everyone has something in life that is a fun little treat that they do just for themselves. It may be: reading the morning paper, or going for a daily jog, or having coffee with a friend once a week, or going to a movie on Friday nights, or fine chocolates, or bubble baths, or curling up with a good book.  Unless you do fun things for yourself every now and then, it's hard to have the energy for fun with your baby. Whatever your special treats are, make sure you find a way to continue to indulge in them after your baby is born. These special moments can recharge you, and allow you to return to baby with a new joy   PERINATAL MOOD DISORDERS: [place]   Emergency and Crisis Resources:  If you are an imminent risk to self or others, are experiencing intense personal distress, and/or have noticed significant changes in activities of daily living, call:   [facility]: [place]: [place]: [phone] Or visit the following crisis centers:  Local Emergency Departments  [facility]: [address], [place]. Hours: 8:30AM-5PM. Insurance Accepted: Medicaid, Medicare, and Uninsured.   [facility]  [address] [place] Mon-Friday 8am-3pm   [phone]                                                                                    Non-Crisis Resources: To identify specific providers that are covered by your insurance, contact your insurance company or local agencies: [place]: [phone] [facility] and [place]: [phone] [place]: [phone] Postpartum [employer]- [place] 1-412-772-7168                                                      Outpatient therapy and medication management providers:  [facility] [phone] Hours: 9AM-5PM  Insurance Accepted: Alben Spittle, Lorella Nimrod, Freddrick March, [place], Medicare  [facility] ([place]) [phone] Hours: 8AM-5PM  nsurance Accepted: All insurances EXCEPT AARP, [place], [place], and [place]: [phone]             Hours: 8AM-8PM Insurance Accepted: Cristal Ford, Freddrick March, [place], Medicare, [id number] [facility]: [phone] Hours: 8:30AM-7PM Insurance Accepted: Cristal Ford, Medicaid,  Medicare, Tricare, [employer] Counseling:  [place] Accepted:  Holland Falling, [place], [facility], [place], [place] [phone] Hours: 9AM-5:30PM Insurance Accepted: Alben Spittle, Charlotte Crumb, and [place], Medicare, [facility]:  [phone] Hours: 9am-5pm Insurance Accepted: BCBS; they do not accept Medicaid/Medicare [place]: [phone] Hours: 9am-9pm Insurance Accepted: All major insurance including Medicaid and Medicare [facility]: [phone] Hours: 9AM-5:30PM Insurance Accepted: All insurances EXCEPT Medicaid and Medicare. [facility]: [place]: [phone] [place]:  [place] (support for children in the NICU and/or with special needs), [place] [employer]: [phone]                                                                                     Online Resources: Postpartum Support International: http://jones-berg.com/  800-944-4PPD 2Moms Supporting Moms:  www.momssupportingmoms.net                                                                                                                      Women's [place] - After Baby  Mom Talk  This mom-led group offers support and connection to mothers as they journey through the adjustments and struggles of that overwhelming first year after the birth of a child. A member of our [facility] staff will be present to share resources and additional support as you seek to care for yourself and baby. It is held at [facility] at 10:00amTuesday mornings, and at 6:00pm Thursday evenings. Babies welcome.  No registration or fee.  Breastfeeding Support Group The Breastfeeding Support Group is for new mothers who have questions or need support and encouragement with breastfeeding. In addition, it gives the opportunity to share with other mothers who value the breastfeeding experience. It meets each Monday evening at 7:00pm and each Tuesday from 11am-12:00pm at [facility] in the [place]. Come and bring your baby. No registration or fee.  Baby and Me 'Baby and Me' is both a learning experience and a time to meet other new moms and babies. Each week brings a new speaker or activity. Topics include: Sleep  Issues, Infant Massage, Baby Sign Language, Little Gym, Kindermusik, Nutrition Sense and many more. The group meets each Thursday from 11am to 12noon at [facility] in the [facility]. Come and bring your baby. No registration or fee.                                                                             For more information, call [phone]                                                                                                         COMMUNITY RESOURCES    Possible Utility Assistance  1. [facility] [place]) [phone] (some housing help available, priority goes to [place] shelter residents with an income)  2. [place] [phone] (some assistance available as funding permits)  3. [place] (up to $50 only, as funding permits)  4. [facility] [address] [phone] (must make an appointment/ bring eviction notice or utility disconnection notice/ bring valid picture ID, social security cards for all household members, proof of household income{example: pay stubs, SSI, disability, child support, bills from last 3 months)  5. [employer] [phone] 8:30-5:00  6. [facility] [phone] M, T, Th 10:00am-2:00pm  7. [facility] [phone], extension 227 T, Th 10:00am-1:00pm  8. Art gallery manager [facility]) [phone]  9.  (For homeowners only):  [place] [phone], up to $600 in utility repairs  10. [employer] Call [phone] (free service for low-income home owners, free energy assessment and help with energy improvements, including lightbulbs, low-flow shower head, etc.)  Transportation Options:  1. [place]: reduced-fare bus ID available with unexpired Medicaid/Medicare or the orange card/ call [phone] to confirm  hours to obtain ID card  2. Medicaid Transportation: Call [phone] to apply  3. [place] Paratransit services: Eligible riders only, call [phone] for application   Housing Options with Income (including Disability):  1. [employer]: walk-in for application, [place] (tall Self-Help Building),  Housing Hotline  at (401) 767-2173 offers crisis intervention, information, referrals, and housing counseling, available for any question about housing  [facility] for those experiencing homelessness     [address] [place], [place] [phone]/ Monday-Friday 8am-3pm/ Saturday-Sunday 8am-2pm (Hours subject to change)  [employer] [phone] to set up resource counseling appointment (food and clothing referrals, legal advice, etc.), many classes and workshops available for women, [place]

## 2018-04-23 NOTE — BH Specialist Note (Signed)
  Integrated Behavioral Health Visit via Telemedicine  04/23/2018 Taylor Buck [id number]   Session Start time: 10:30  Session End time: 11:01 Total time: 30 minutes  Referring Provider: Catalina Antigua, MD Type of Visit: Telephonic Patient location: Home [facility] Provider location: [facility] All persons participating in visit: [facility] and Patient  Confirmed patient's address: Yes  Confirmed patient's phone number: Yes  Any changes to demographics: Yes   Confirmed patient's insurance: Yes  Any changes to patient's insurance: No   Discussed confidentiality: Yes    The following statements were read to the patient and/or legal guardian that are established with the [facility] Provider.  "The purpose of this phone visit is to provide behavioral health care while limiting exposure to the coronavirus (COVID19). "  "By engaging in this telephone visit, you consent to the provision of healthcare.  Additionally, you authorize for your insurance to be billed for the services provided during this telephone visit."   Patient and/or legal guardian consented to telephone visit: Yes   PRESENTING CONCERNS: Patient and/or family reports the following symptoms/concerns: Pt states her primary concern today is feeling depressed, attributed to life stress(financial, caring for sister on chemotherapy treatment; FOB unfaithful, housing insecurity). Pt had 3 [facility] hospitalizations at 28yo, 28yo; 28yo, with SI. No current SI. Pt agrees to safety plan, very self-aware of her symptoms, and will call crisis line or go to ED if symptoms return.  Duration of problem: Bipolar disorder ongoing; current life stress more recent; Severity of problem: severe   GOALS ADDRESSED: Patient will: 1.  Maintain reduction of symptoms of: mood instability and stress  2.  Increase knowledge and/or ability of: healthy habits and stress reduction  3.  Demonstrate ability to: Increase healthy adjustment to current life  circumstances and Increase adequate support systems for patient/family  INTERVENTIONS: Interventions utilized:  Solution-Focused Strategies, Psychoeducation and/or Health Education and Link to [employer] Standardized Assessments completed: GAD-7 and PHQ 2&9 with C-SSRS  ASSESSMENT: Patient currently experiencing Bipolar 1 disorder (Previously diagnosed via psychiatry) and Psychosocial stress  Patient may benefit from psychoeducation and brief therapeutic interventions regarding coping with current life stress and Bipolar affective disorder. Marland Kitchen  PLAN: 1. Follow up with behavioral health clinician on : Two weeks 2. Behavioral recommendations:  -Call Crisis line or go to ED if SI or mood instability returns -Reschedule [facility] appointment asap -Continue taking BH medication as prescribed -Continue using self-coping strategies that have worked in the past -Establish care with psychiatry as soon as able, prior to birth -Read through AVS in MyChart (Postpartum Planner, BH treatment options for ongoing therapy and psychiatry, apps for additional self-coping, and community resources) 3. Referral(s): Integrated Art gallery manager (In Clinic), Community Mental Health Services (LME/Outside Clinic) and [employer] Resources:  Arts administrator, Actuary, Housing and Transportation   [employer]

## 2018-04-24 ENCOUNTER — Other Ambulatory Visit: Payer: Self-pay

## 2018-04-24 ENCOUNTER — Ambulatory Visit (INDEPENDENT_AMBULATORY_CARE_PROVIDER_SITE_OTHER): Payer: Medicaid Other

## 2018-04-24 VITALS — BP 123/74 | HR 79 | Ht 67.0 in | Wt 206.8 lb

## 2018-04-24 DIAGNOSIS — O09213 Supervision of pregnancy with history of pre-term labor, third trimester: Secondary | ICD-10-CM | POA: Diagnosis not present

## 2018-04-24 DIAGNOSIS — Z3A34 34 weeks gestation of pregnancy: Secondary | ICD-10-CM

## 2018-04-24 DIAGNOSIS — O09219 Supervision of pregnancy with history of pre-term labor, unspecified trimester: Principal | ICD-10-CM

## 2018-04-24 DIAGNOSIS — O09899 Supervision of other high risk pregnancies, unspecified trimester: Secondary | ICD-10-CM

## 2018-04-24 NOTE — Progress Notes (Signed)
Presents for 17P Injection, given in RA, tolerated well.  Administrations This Visit    HYDROXYprogesterone Caproate SOAJ 275 mg    Admin Date 04/24/2018 Action Given Dose 275 mg Route Subcutaneous Administered By Maretta Bees, RMA

## 2018-04-25 ENCOUNTER — Encounter (HOSPITAL_COMMUNITY): Payer: Self-pay | Admitting: *Deleted

## 2018-04-25 ENCOUNTER — Inpatient Hospital Stay (HOSPITAL_COMMUNITY)
Admission: AD | Admit: 2018-04-25 | Discharge: 2018-04-25 | Discharge status: Against Medical Advice | Payer: Medicaid Other | Source: Ambulatory Visit | Attending: Obstetrics and Gynecology | Admitting: Obstetrics and Gynecology

## 2018-04-25 ENCOUNTER — Other Ambulatory Visit: Payer: Self-pay

## 2018-04-25 DIAGNOSIS — F319 Bipolar disorder, unspecified: Secondary | ICD-10-CM | POA: Diagnosis not present

## 2018-04-25 DIAGNOSIS — Z79899 Other long term (current) drug therapy: Secondary | ICD-10-CM | POA: Diagnosis not present

## 2018-04-25 DIAGNOSIS — O212 Late vomiting of pregnancy: Secondary | ICD-10-CM | POA: Diagnosis not present

## 2018-04-25 DIAGNOSIS — O09219 Supervision of pregnancy with history of pre-term labor, unspecified trimester: Secondary | ICD-10-CM

## 2018-04-25 DIAGNOSIS — J45909 Unspecified asthma, uncomplicated: Secondary | ICD-10-CM | POA: Diagnosis not present

## 2018-04-25 DIAGNOSIS — F1911 Other psychoactive substance abuse, in remission: Secondary | ICD-10-CM

## 2018-04-25 DIAGNOSIS — Z3A34 34 weeks gestation of pregnancy: Secondary | ICD-10-CM | POA: Diagnosis not present

## 2018-04-25 DIAGNOSIS — R011 Cardiac murmur, unspecified: Secondary | ICD-10-CM | POA: Diagnosis not present

## 2018-04-25 DIAGNOSIS — O99513 Diseases of the respiratory system complicating pregnancy, third trimester: Secondary | ICD-10-CM | POA: Diagnosis not present

## 2018-04-25 DIAGNOSIS — O26893 Other specified pregnancy related conditions, third trimester: Secondary | ICD-10-CM | POA: Insufficient documentation

## 2018-04-25 DIAGNOSIS — M1990 Unspecified osteoarthritis, unspecified site: Secondary | ICD-10-CM | POA: Diagnosis not present

## 2018-04-25 DIAGNOSIS — F17210 Nicotine dependence, cigarettes, uncomplicated: Secondary | ICD-10-CM | POA: Diagnosis not present

## 2018-04-25 DIAGNOSIS — R51 Headache: Secondary | ICD-10-CM | POA: Diagnosis not present

## 2018-04-25 DIAGNOSIS — O99333 Smoking (tobacco) complicating pregnancy, third trimester: Secondary | ICD-10-CM | POA: Diagnosis not present

## 2018-04-25 DIAGNOSIS — O99343 Other mental disorders complicating pregnancy, third trimester: Secondary | ICD-10-CM | POA: Insufficient documentation

## 2018-04-25 DIAGNOSIS — Z8659 Personal history of other mental and behavioral disorders: Secondary | ICD-10-CM

## 2018-04-25 DIAGNOSIS — O09899 Supervision of other high risk pregnancies, unspecified trimester: Secondary | ICD-10-CM

## 2018-04-25 LAB — URINALYSIS, ROUTINE W REFLEX MICROSCOPIC
Bilirubin Urine: NEGATIVE
Glucose, UA: NEGATIVE mg/dL
Hgb urine dipstick: NEGATIVE
Ketones, ur: NEGATIVE mg/dL
Nitrite: NEGATIVE
Protein, ur: NEGATIVE mg/dL
Specific Gravity, Urine: 1.009 (ref 1.005–1.030)
pH: 8 (ref 5.0–8.0)

## 2018-04-25 LAB — PROTEIN / CREATININE RATIO, URINE
CREATININE, URINE: 45.27 mg/dL
PROTEIN CREATININE RATIO: 0.2 mg/mg{creat} — AB (ref 0.00–0.15)
TOTAL PROTEIN, URINE: 9 mg/dL

## 2018-04-25 LAB — CBC
HCT: 33.7 % — ABNORMAL LOW (ref 36.0–46.0)
Hemoglobin: 11.1 g/dL — ABNORMAL LOW (ref 12.0–15.0)
MCH: 28 pg (ref 26.0–34.0)
MCHC: 32.9 g/dL (ref 30.0–36.0)
MCV: 84.9 fL (ref 80.0–100.0)
Platelets: 278 10*3/uL (ref 150–400)
RBC: 3.97 MIL/uL (ref 3.87–5.11)
RDW: 13 % (ref 11.5–15.5)
WBC: 9.6 10*3/uL (ref 4.0–10.5)
nRBC: 0 % (ref 0.0–0.2)

## 2018-04-25 LAB — COMPREHENSIVE METABOLIC PANEL
ALT: 22 U/L (ref 0–44)
AST: 18 U/L (ref 15–41)
Albumin: 2.6 g/dL — ABNORMAL LOW (ref 3.5–5.0)
Alkaline Phosphatase: 128 U/L — ABNORMAL HIGH (ref 38–126)
Anion gap: 6 (ref 5–15)
BUN: <5 mg/dL — ABNORMAL LOW (ref 6–20)
CO2: 22 mmol/L (ref 22–32)
Calcium: 8.6 mg/dL — ABNORMAL LOW (ref 8.9–10.3)
Chloride: 106 mmol/L (ref 98–111)
Creatinine, Ser: 0.61 mg/dL (ref 0.44–1.00)
GFR calc Af Amer: >60 mL/min (ref >60)
Glucose, Bld: 91 mg/dL (ref 70–99)
Potassium: 3.5 mmol/L (ref 3.5–5.1)
Sodium: 134 mmol/L — ABNORMAL LOW (ref 135–145)
Total Bilirubin: 0.3 mg/dL (ref 0.3–1.2)
Total Protein: 5.4 g/dL — ABNORMAL LOW (ref 6.5–8.1)

## 2018-04-25 LAB — RAPID URINE DRUG SCREEN, HOSP PERFORMED
Amphetamines: NOT DETECTED
Barbiturates: NOT DETECTED
Benzodiazepines: NOT DETECTED
Cocaine: NOT DETECTED
Opiates: NOT DETECTED
Tetrahydrocannabinol: NOT DETECTED

## 2018-04-25 MED ORDER — PROCHLORPERAZINE EDISYLATE 10 MG/2ML IJ SOLN
10.0000 mg | Freq: Once | INTRAMUSCULAR | Status: AC
  Administered 2018-04-25: 10 mg via INTRAVENOUS
  Filled 2018-04-25: qty 2

## 2018-04-25 MED ORDER — CYCLOBENZAPRINE HCL 10 MG PO TABS
10.0000 mg | ORAL_TABLET | Freq: Once | ORAL | Status: AC
  Administered 2018-04-25: 10 mg via ORAL
  Filled 2018-04-25: qty 1

## 2018-04-25 MED ORDER — LACTATED RINGERS IV BOLUS
1000.0000 mL | Freq: Once | INTRAVENOUS | Status: AC
  Administered 2018-04-25: 1000 mL via INTRAVENOUS

## 2018-04-25 MED ORDER — DIPHENHYDRAMINE HCL 50 MG/ML IJ SOLN
12.5000 mg | Freq: Once | INTRAMUSCULAR | Status: AC
  Administered 2018-04-25: 12.5 mg via INTRAVENOUS
  Filled 2018-04-25: qty 1

## 2018-04-25 MED ORDER — DEXAMETHASONE SODIUM PHOSPHATE 10 MG/ML IJ SOLN
10.0000 mg | Freq: Once | INTRAMUSCULAR | Status: AC
  Administered 2018-04-25: 10 mg via INTRAVENOUS
  Filled 2018-04-25: qty 1

## 2018-04-25 NOTE — MAU Note (Signed)
Pt reports she has had a headache since 12am. Took tylenol without much relief. C/o increased swelling in her hands and feet. Has had some dizziness and seeing spots on occasion.

## 2018-04-25 NOTE — MAU Provider Note (Signed)
History     CSN: [id number]  Arrival date and time: 04/25/18 1451   First Provider Initiated Contact with Patient 04/25/18 1527      Chief Complaint  Patient presents with  . Headache   [name] is a 28 y.o. [phone] at [redacted]w[redacted]d who presents to MAU for preeclampsia evaluation after "calling the triage line for [facility]" around 1400 today. Pt reports she called d/t swelling of her legs, hands and HA. Pt reports sx started around 0200 today. Pt reports she had a HA at that time that was very severe. Pt took 2 regular strength Tylenol around 0200 and 0800 today. Pt reports it did help her HA, but it did not go away entirely. Currently rates HA as 6-7/10. Pt also reports lip swelling at 0200, but reports it is not present at this time.  Pt reports is currently nauseous, and has vomited today x1. Nausea started around 0200 with HA and pt reports she threw up around that time as well. Pt denies hx of migraines or other chronic HA. Pt reports today drinking 3 bottles of water, single cup of coffee, ate [employer] bar for breakfast and chicken alfredo for lunch.   Pt denies blurry vision/seeing spots, epigastric pain, swelling in face, sudden weight gain. Pt denies chest pain and SOB.  Pt denies constipation, diarrhea, or urinary problems. Pt denies fever, chills, fatigue, sweating or changes in appetite. Pt denies dizziness, light-headedness, weakness.  Pt denies VB, ctx, LOF and reports good FM.  Current pregnancy problems? Hx of PTL Blood Type? A positive Allergies? Milk, PCN (pt is currently taking Keflex without issue), Reglan Current medications? Keflex, weekly Makena, PNVs, Zoloft, Prilosec, Tums Current PNC & next appt? [facility], 04/30/2018   OB History    Gravida  6   Para  1   Term  0   Preterm  1   AB  4   Living  1     SAB  4   TAB      Ectopic      Multiple      Live Births  1           Past Medical History:  Diagnosis Date  .  Anxiety   . Arthritis   . Asthma   . Depression   . Heart murmur   . Infection    chlamydia age 64  . PCOS (polycystic ovarian syndrome)   . Sleep apnea   . Urinary tract infection     Past Surgical History:  Procedure Laterality Date  . HAND SURGERY     reconstruction- injury due to frostbite as child  . HERNIA REPAIR     rt inguinal    Family History  Problem Relation Age of Onset  . Diabetes Mother   . COPD Mother   . Diabetes Maternal Aunt   . Cancer Maternal Aunt        breast  . Diabetes Maternal Grandmother   . Cancer Maternal Grandmother        skin cancer  . Alcoholism Other   . Arthritis Other   . Asthma Other   . Depression Other   . Hearing loss Neg Hx     Social History   Tobacco Use  . Smoking status: Current Every Day Smoker    Packs/day: 0.25    Types: Cigarettes  . Smokeless tobacco: Never Used  . Tobacco comment: 1 PACK A DAY - 4 CIGARETTES A DAY   Substance Use  Topics  . Alcohol use: No  . Drug use: Not Currently    Types: Marijuana    Comment: UP UNTIL +UPT    Allergies:  Allergies  Allergen Reactions  . Milk-Related Compounds Anaphylaxis  . Latex Hives  . Penicillins Hives    Has patient had a PCN reaction causing immediate rash, facial/tongue/throat swelling, SOB or lightheadedness with hypotension: Yes Has patient had a PCN reaction causing severe rash involving mucus membranes or skin necrosis: No Has patient had a PCN reaction that required hospitalization: No Has patient had a PCN reaction occurring within the last 10 years: No If all of the above answers are "NO", then may proceed with Cephalosporin use.   . Reglan [Metoclopramide] Other (See Comments)    Panic attack    Facility-Administered Medications Prior to Admission  Medication Dose Route Frequency Provider Last Rate Last Dose  . HYDROXYprogesterone Caproate SOAJ 275 mg  275 mg Subcutaneous Weekly Constant, Peggy, MD   275 mg at 04/24/18 [id number]   Medications Prior  to Admission  Medication Sig Dispense Refill Last Dose  . acetaminophen (TYLENOL) 325 MG tablet Take 650 mg by mouth daily as needed for pain.   04/25/2018 at Unknown time  . calcium carbonate (TUMS EX) 750 MG chewable tablet Chew 1 tablet by mouth at bedtime as needed (for nausea).   04/25/2018 at Unknown time  . cephALEXin (KEFLEX) 500 MG capsule Take 1 capsule (500 mg total) by mouth 3 (three) times daily. 21 capsule 0 04/25/2018 at Unknown time  . NIFEdipine (PROCARDIA-XL/NIFEDICAL-XL) 30 MG 24 hr tablet Take 1 tablet (30 mg total) by mouth 2 (two) times daily. As needed for symptomatic contractions 30 tablet 2 Past Month at Unknown time  . omeprazole (PRILOSEC) 20 MG capsule Take 1 capsule (20 mg total) by mouth daily. 60 capsule 5 04/25/2018 at Unknown time  . Prenatal Vit-Fe Phos-FA-Omega (VITAFOL GUMMIES) 3.33-0.333-34.8 MG CHEW Chew 3 tablets by mouth daily. 90 tablet 2 04/25/2018 at Unknown time  . sertraline (ZOLOFT) 25 MG tablet Take 1 tablet (25 mg total) by mouth daily. Take one 25 mg tab in addition to the 100 mg tab daily, to make 125 mg daily. 30 tablet 2 04/25/2018 at Unknown time    Review of Systems  Constitutional: Negative for appetite change, chills, diaphoresis, fatigue and fever.  Respiratory: Negative for shortness of breath.   Cardiovascular: Negative for chest pain.  Gastrointestinal: Positive for nausea and vomiting. Negative for abdominal pain, constipation and diarrhea.  Genitourinary: Negative for dysuria, flank pain, frequency, pelvic pain, urgency, vaginal bleeding and vaginal discharge.  Neurological: Positive for headaches. Negative for dizziness, weakness and light-headedness.   Physical Exam   Blood pressure (!) 104/59, pulse 63, temperature 98.6 F (37 C), resp. rate 18, height 5\' 7"  (1.702 m), weight 92.4 kg, last menstrual period 08/25/2017, unknown if currently breastfeeding.   Patient Vitals for the past 24 hrs:  BP Temp Pulse Resp Height Weight   04/25/18 1615 (!) 104/59 - 63 - - -  04/25/18 1545 112/64 - 69 - - -  04/25/18 1530 120/64 - 75 - - -  04/25/18 1515 122/65 - 83 - - -  04/25/18 1514 122/72 - 86 - - -  04/25/18 1513 122/72 98.6 F (37 C) 81 18 5\' 7"  (1.702 m) 92.4 kg     Physical Exam  Constitutional: She is oriented to person, place, and time. She appears well-developed and well-nourished. No distress.  HENT:  Head: Normocephalic and atraumatic.  Cardiovascular:  No LE edema, possibly mild bilateral edema of hands, no evidence of facial swelling.  Respiratory: Effort normal.  GI: Soft. She exhibits no distension and no mass. There is no abdominal tenderness. There is no rebound and no guarding.  Neurological: She is alert and oriented to person, place, and time.  Skin: Skin is warm and dry. She is not diaphoretic.  Psychiatric: She has a normal mood and affect. Her behavior is normal.   Results for orders placed or performed during the hospital encounter of 04/25/18 (from the past 24 hour(s))  Urinalysis, Routine w reflex microscopic     Status: Abnormal   Collection Time: 04/25/18  3:26 PM  Result Value Ref Range   Color, Urine YELLOW YELLOW   APPearance CLEAR CLEAR   Specific Gravity, Urine 1.009 1.005 - 1.030   pH 8.0 5.0 - 8.0   Glucose, UA NEGATIVE NEGATIVE mg/dL   Hgb urine dipstick NEGATIVE NEGATIVE   Bilirubin Urine NEGATIVE NEGATIVE   Ketones, ur NEGATIVE NEGATIVE mg/dL   Protein, ur NEGATIVE NEGATIVE mg/dL   Nitrite NEGATIVE NEGATIVE   Leukocytes,Ua MODERATE (A) NEGATIVE   RBC / HPF 0-5 0 - 5 RBC/hpf   WBC, UA 6-10 0 - 5 WBC/hpf   Bacteria, UA RARE (A) NONE SEEN   Squamous Epithelial / LPF 0-5 0 - 5   Mucus PRESENT   Protein / creatinine ratio, urine     Status: Abnormal   Collection Time: 04/25/18  3:26 PM  Result Value Ref Range   Creatinine, Urine 45.27 mg/dL   Total Protein, Urine 9 mg/dL   Protein Creatinine Ratio 0.20 (H) 0.00 - 0.15 mg/mg[Cre]  Urine rapid drug screen (hosp  performed)     Status: None   Collection Time: 04/25/18  3:26 PM  Result Value Ref Range   Opiates NONE DETECTED NONE DETECTED   Cocaine NONE DETECTED NONE DETECTED   Benzodiazepines NONE DETECTED NONE DETECTED   Amphetamines NONE DETECTED NONE DETECTED   Tetrahydrocannabinol NONE DETECTED NONE DETECTED   Barbiturates NONE DETECTED NONE DETECTED  CBC     Status: Abnormal   Collection Time: 04/25/18  4:00 PM  Result Value Ref Range   WBC 9.6 4.0 - 10.5 K/uL   RBC 3.97 3.87 - 5.11 MIL/uL   Hemoglobin 11.1 (L) 12.0 - 15.0 g/dL   HCT 01.4 (L) 10.3 - 01.3 %   MCV 84.9 80.0 - 100.0 fL   MCH 28.0 26.0 - 34.0 pg   MCHC 32.9 30.0 - 36.0 g/dL   RDW 14.3 88.8 - 75.7 %   Platelets 278 150 - 400 K/uL   nRBC 0.0 0.0 - 0.2 %  Comprehensive metabolic panel     Status: Abnormal   Collection Time: 04/25/18  4:00 PM  Result Value Ref Range   Sodium 134 (L) 135 - 145 mmol/L   Potassium 3.5 3.5 - 5.1 mmol/L   Chloride 106 98 - 111 mmol/L   CO2 22 22 - 32 mmol/L   Glucose, Bld 91 70 - 99 mg/dL   BUN <5 (L) 6 - 20 mg/dL   Creatinine, Ser 9.72 0.44 - 1.00 mg/dL   Calcium 8.6 (L) 8.9 - 10.3 mg/dL   Total Protein 5.4 (L) 6.5 - 8.1 g/dL   Albumin 2.6 (L) 3.5 - 5.0 g/dL   AST 18 15 - 41 U/L   ALT 22 0 - 44 U/L   Alkaline Phosphatase 128 (H) 38 - 126 U/L   Total Bilirubin 0.3 0.3 -  1.2 mg/dL   GFR calc non Af Amer >60 >60 mL/min   GFR calc Af Amer >60 >60 mL/min   Anion gap 6 5 - 15   No results found.   MAU Course  Procedures  MDM -pt signed in for preeclampsia work-up -BPs WNL -HA not completely resolved by Tylenol, giving Flexeril -EFM baseline 135, mod variability, pos accels (15x15), neg decels. TOCO no ctx -UA: moderate leukocytes/rare bacteria, sending for culture, pt currently taking Keflex -Pro/Creatinine Ratio: 0.2 -CBC: WNL for pregnancy -CMP: no abnormal lab values requiring treatment -UDS: all negative -HA not improved with Flexeril -HA cocktail given with LR, decadron,  benadryl, compazine -After of LR and the medication, pt reports HA is now 1-2/10 and she feels significantly better and is requesting to go home -recommended pt to complete remainder of IV fluids, pt refused  Orders Placed This Encounter  Procedures  . Culture, OB Urine    Standing Status:   Standing    Number of Occurrences:   1  . Urinalysis, Routine w reflex microscopic    Standing Status:   Standing    Number of Occurrences:   1  . CBC    Standing Status:   Standing    Number of Occurrences:   1  . Comprehensive metabolic panel    Standing Status:   Standing    Number of Occurrences:   1  . Protein / creatinine ratio, urine    Standing Status:   Standing    Number of Occurrences:   1  . Urine rapid drug screen (hosp performed)    Standing Status:   Standing    Number of Occurrences:   1  . Discharge patient    Order Specific Question:   Discharge disposition    Answer:   01-Home or Self Care [1]    Order Specific Question:   Discharge patient date    Answer:   04/25/2018   Meds ordered this encounter  Medications  . cyclobenzaprine (FLEXERIL) tablet 10 mg  . lactated ringers bolus 1,000 mL  . dexamethasone (DECADRON) injection 10 mg  . diphenhydrAMINE (BENADRYL) injection 12.5 mg  . prochlorperazine (COMPAZINE) injection 10 mg     Assessment and Plan   1. Headache in pregnancy, antepartum, third trimester   2. H/O: substance abuse (HCC)   3. History of preterm delivery, currently pregnant   4. Bipolar 1 disorder (HCC)   5. History of depression    Allergies as of 04/25/2018      Reactions   Milk-related Compounds Anaphylaxis   Latex Hives   Penicillins Hives   Has patient had a PCN reaction causing immediate rash, facial/tongue/throat swelling, SOB or lightheadedness with hypotension: Yes Has patient had a PCN reaction causing severe rash involving mucus membranes or skin necrosis: No Has patient had a PCN reaction that required hospitalization: No Has  patient had a PCN reaction occurring within the last 10 years: No If all of the above answers are "NO", then may proceed with Cephalosporin use.   Reglan [metoclopramide] Other (See Comments)   Panic attack      Medication List    TAKE these medications   acetaminophen 325 MG tablet Commonly known as:  TYLENOL Take 650 mg by mouth daily as needed for pain.   calcium carbonate 750 MG chewable tablet Commonly known as:  TUMS EX Chew 1 tablet by mouth at bedtime as needed (for nausea).   cephALEXin 500 MG capsule Commonly known as:  KEFLEX Take 1 capsule (500 mg total) by mouth 3 (three) times daily.   NIFEdipine 30 MG 24 hr tablet Commonly known as:  PROCARDIA-XL/NIFEDICAL-XL Take 1 tablet (30 mg total) by mouth 2 (two) times daily. As needed for symptomatic contractions   omeprazole 20 MG capsule Commonly known as:  PriLOSEC Take 1 capsule (20 mg total) by mouth daily.   sertraline 25 MG tablet Commonly known as:  Zoloft Take 1 tablet (25 mg total) by mouth daily. Take one 25 mg tab in addition to the 100 mg tab daily, to make 125 mg daily.   Vitafol Gummies 3.33-0.333-34.8 MG Chew Chew 3 tablets by mouth daily.      -discussed appropriate hydration in pregnancy -encouraged reduction and cessation of smoking -pt left AMA prior to discharge instructions  Odie Sera Michail Boyte 04/25/2018, 6:41 PM

## 2018-04-26 LAB — CULTURE, OB URINE: Culture: NO GROWTH

## 2018-04-30 ENCOUNTER — Encounter: Payer: Self-pay | Admitting: Obstetrics and Gynecology

## 2018-04-30 ENCOUNTER — Other Ambulatory Visit: Payer: Self-pay

## 2018-04-30 ENCOUNTER — Ambulatory Visit (INDEPENDENT_AMBULATORY_CARE_PROVIDER_SITE_OTHER): Payer: Medicaid Other | Admitting: Obstetrics and Gynecology

## 2018-04-30 VITALS — BP 112/72 | HR 94 | Wt 201.3 lb

## 2018-04-30 DIAGNOSIS — O09219 Supervision of pregnancy with history of pre-term labor, unspecified trimester: Principal | ICD-10-CM

## 2018-04-30 DIAGNOSIS — O09899 Supervision of other high risk pregnancies, unspecified trimester: Secondary | ICD-10-CM

## 2018-04-30 DIAGNOSIS — O0990 Supervision of high risk pregnancy, unspecified, unspecified trimester: Secondary | ICD-10-CM

## 2018-04-30 DIAGNOSIS — Z3A34 34 weeks gestation of pregnancy: Secondary | ICD-10-CM

## 2018-04-30 DIAGNOSIS — F1911 Other psychoactive substance abuse, in remission: Secondary | ICD-10-CM

## 2018-04-30 DIAGNOSIS — O09213 Supervision of pregnancy with history of pre-term labor, third trimester: Secondary | ICD-10-CM

## 2018-04-30 NOTE — Progress Notes (Signed)
   PRENATAL VISIT NOTE  Subjective:  Taylor Buck is a 28 y.o. [phone] at [redacted]w[redacted]d being seen today for ongoing prenatal care.  She is currently monitored for the following issues for this high-risk pregnancy and has Supervision of high risk pregnancy, antepartum; History of preterm delivery, currently pregnant; Bipolar 1 disorder (HCC); History of depression; and H/O: substance abuse (HCC) on their problem list.  Patient reports a little bit more pressure than in the past, thinks she lost her mucous plug this am.  Contractions: Irritability. Vag. Bleeding: None.  Movement: Present. Denies leaking of fluid.   The following portions of the patient's history were reviewed and updated as appropriate: allergies, current medications, past family history, past medical history, past social history, past surgical history and problem list.   Objective:   Vitals:   04/30/18 1358  BP: 112/72  Pulse: 94  Weight: 201 lb 4.8 oz (91.3 kg)    Fetal Status: Fetal Heart Rate (bpm): 143 Fundal Height: 34 cm Movement: Present     General:  Alert, oriented and cooperative. Patient is in no acute distress.  Skin: Skin is warm and dry. No rash noted.   Cardiovascular: Normal heart rate noted  Respiratory: Normal respiratory effort, no problems with respiration noted  Abdomen: Soft, gravid, appropriate for gestational age.  Pain/Pressure: Present     Pelvic: Cervical exam deferred        Extremities: Normal range of motion.  Edema: Trace  Mental Status: Normal mood and affect. Normal behavior. Normal judgment and thought content.   Assessment and Plan:  Pregnancy: [id number] at [redacted]w[redacted]d  1. Supervision of high risk pregnancy, antepartum Reviewed Mirena, reviewed IOL, reviewed reasons to present to hospital  2. History of preterm delivery, currently pregnant Cont Makena  3. H/O: substance abuse (HCC) No opioids s/p delivery   Preterm labor symptoms and general obstetric precautions including  but not limited to vaginal bleeding, contractions, leaking of fluid and fetal movement were reviewed in detail with the patient. Please refer to After Visit Summary for other counseling recommendations.   Return in about 1 week (around 05/07/2018).  Future Appointments  Date Time Provider Department Center  05/07/2018  8:30 AM [facility] NURSE [facility] None    Conan Bowens, MD

## 2018-04-30 NOTE — Progress Notes (Signed)
ROB.  17P given in LA, tolerated well. . Administrations This Visit    HYDROXYprogesterone Caproate SOAJ 275 mg    Admin Date 04/30/2018 Action Given Dose 275 mg Route Subcutaneous Administered By Maretta Bees, RMA

## 2018-05-07 ENCOUNTER — Other Ambulatory Visit (HOSPITAL_COMMUNITY)
Admission: RE | Admit: 2018-05-07 | Discharge: 2018-05-07 | Disposition: A | Discharge status: Home / Self Care | Payer: Medicaid Other | Source: Ambulatory Visit | Attending: Obstetrics & Gynecology | Admitting: Obstetrics & Gynecology

## 2018-05-07 ENCOUNTER — Ambulatory Visit (INDEPENDENT_AMBULATORY_CARE_PROVIDER_SITE_OTHER): Payer: Medicaid Other

## 2018-05-07 ENCOUNTER — Other Ambulatory Visit: Payer: Self-pay

## 2018-05-07 VITALS — BP 120/71 | HR 83 | Wt 203.0 lb

## 2018-05-07 DIAGNOSIS — O09219 Supervision of pregnancy with history of pre-term labor, unspecified trimester: Secondary | ICD-10-CM

## 2018-05-07 DIAGNOSIS — O0990 Supervision of high risk pregnancy, unspecified, unspecified trimester: Secondary | ICD-10-CM | POA: Insufficient documentation

## 2018-05-07 DIAGNOSIS — O0993 Supervision of high risk pregnancy, unspecified, third trimester: Secondary | ICD-10-CM

## 2018-05-07 DIAGNOSIS — Z3A36 36 weeks gestation of pregnancy: Secondary | ICD-10-CM

## 2018-05-07 DIAGNOSIS — O09899 Supervision of other high risk pregnancies, unspecified trimester: Secondary | ICD-10-CM

## 2018-05-07 DIAGNOSIS — O09213 Supervision of pregnancy with history of pre-term labor, third trimester: Secondary | ICD-10-CM | POA: Diagnosis not present

## 2018-05-07 NOTE — Patient Instructions (Signed)

## 2018-05-07 NOTE — Progress Notes (Signed)
   PRENATAL VISIT NOTE  Subjective:  Taylor Buck is a 28 y.o. [phone] at [redacted]w[redacted]d being seen today for ongoing prenatal care.  She is currently monitored for the following issues for this high-risk pregnancy and has Supervision of high risk pregnancy, antepartum; History of preterm delivery, currently pregnant; Bipolar 1 disorder (HCC); History of depression; and H/O: substance abuse (HCC) on their problem list.  Patient reports occasional contractions.  Contractions: Irritability. Vag. Bleeding: None.  Movement: Present. Denies leaking of fluid.   The following portions of the patient's history were reviewed and updated as appropriate: allergies, current medications, past family history, past medical history, past social history, past surgical history and problem list.   Objective:   Vitals:   05/07/18 0916  BP: 120/71  Pulse: 83  Weight: 203 lb (92.1 kg)    Fetal Status:   Fundal Height: 35 cm Movement: Present  Presentation: Vertex  General:  Alert, oriented and cooperative. Patient is in no acute distress.  Skin: Skin is warm and dry. No rash noted.   Cardiovascular: Normal heart rate noted  Respiratory: Normal respiratory effort, no problems with respiration noted  Abdomen: Soft, gravid, appropriate for gestational age.  Pain/Pressure: Present     Pelvic: Cervical exam performed Dilation: Fingertip Effacement (%): 20 Station: Ballotable  Extremities: Normal range of motion.     Mental Status: Normal mood and affect. Normal behavior. Normal judgment and thought content.   Assessment and Plan:  Pregnancy: [id number] at [redacted]w[redacted]d 1. Supervision of high risk pregnancy, antepartum 17 P last injection today  2. History of preterm delivery, currently pregnant No s/sx PTL  Preterm labor symptoms and general obstetric precautions including but not limited to vaginal bleeding, contractions, leaking of fluid and fetal movement were reviewed in detail with the patient. Please  refer to After Visit Summary for other counseling recommendations.   Return in about 1 week (around 05/14/2018).  Future Appointments  Date Time Provider Department Center  05/07/2018 11:00 AM Adam Phenix, MD [facility] None  05/13/2018 10:00 AM Conan Bowens, MD [facility] None    Scheryl Darter, MD

## 2018-05-09 LAB — STREP GP B NAA: Strep Gp B NAA: NEGATIVE

## 2018-05-11 LAB — CERVICOVAGINAL ANCILLARY ONLY
Chlamydia: NEGATIVE
Neisseria Gonorrhea: NEGATIVE

## 2018-05-13 ENCOUNTER — Encounter: Payer: Self-pay | Admitting: Obstetrics and Gynecology

## 2018-05-13 ENCOUNTER — Ambulatory Visit (INDEPENDENT_AMBULATORY_CARE_PROVIDER_SITE_OTHER): Payer: Medicaid Other | Admitting: Obstetrics and Gynecology

## 2018-05-13 ENCOUNTER — Other Ambulatory Visit: Payer: Self-pay

## 2018-05-13 ENCOUNTER — Other Ambulatory Visit (HOSPITAL_COMMUNITY)
Admission: RE | Admit: 2018-05-13 | Discharge: 2018-05-13 | Disposition: A | Discharge status: Home / Self Care | Payer: Medicaid Other | Source: Ambulatory Visit | Attending: Obstetrics and Gynecology | Admitting: Obstetrics and Gynecology

## 2018-05-13 VITALS — BP 106/73 | HR 101 | Wt 209.5 lb

## 2018-05-13 DIAGNOSIS — O26893 Other specified pregnancy related conditions, third trimester: Secondary | ICD-10-CM

## 2018-05-13 DIAGNOSIS — N898 Other specified noninflammatory disorders of vagina: Secondary | ICD-10-CM | POA: Diagnosis present

## 2018-05-13 DIAGNOSIS — F319 Bipolar disorder, unspecified: Secondary | ICD-10-CM

## 2018-05-13 DIAGNOSIS — O09899 Supervision of other high risk pregnancies, unspecified trimester: Secondary | ICD-10-CM

## 2018-05-13 DIAGNOSIS — O0993 Supervision of high risk pregnancy, unspecified, third trimester: Secondary | ICD-10-CM

## 2018-05-13 DIAGNOSIS — O0990 Supervision of high risk pregnancy, unspecified, unspecified trimester: Secondary | ICD-10-CM

## 2018-05-13 DIAGNOSIS — O26899 Other specified pregnancy related conditions, unspecified trimester: Secondary | ICD-10-CM | POA: Diagnosis not present

## 2018-05-13 DIAGNOSIS — O09219 Supervision of pregnancy with history of pre-term labor, unspecified trimester: Secondary | ICD-10-CM

## 2018-05-13 DIAGNOSIS — Z362 Encounter for other antenatal screening follow-up: Secondary | ICD-10-CM

## 2018-05-13 DIAGNOSIS — F1911 Other psychoactive substance abuse, in remission: Secondary | ICD-10-CM

## 2018-05-13 DIAGNOSIS — Z3A37 37 weeks gestation of pregnancy: Secondary | ICD-10-CM

## 2018-05-13 NOTE — Progress Notes (Signed)
   PRENATAL VISIT NOTE  Subjective:  Taylor Buck is a 28 y.o. [phone] at [redacted]w[redacted]d being seen today for ongoing prenatal care.  She is currently monitored for the following issues for this high-risk pregnancy and has Supervision of high risk pregnancy, antepartum; History of preterm delivery, currently pregnant; Bipolar 1 disorder (HCC); History of depression; and H/O: substance abuse (HCC) on their problem list.  Patient reports green vaginal discharge. Concerned about STDs as she found out her partner cheated on her about a month ago. No known exposure.  Contractions: Irregular. Vag. Bleeding: None.  Movement: Present. Denies leaking of fluid.   The following portions of the patient's history were reviewed and updated as appropriate: allergies, current medications, past family history, past medical history, past social history, past surgical history and problem list.   Objective:   Vitals:   05/13/18 1010  BP: 106/73  Pulse: (!) 101  Weight: 209 lb 8 oz (95 kg)    Fetal Status: Fetal Heart Rate (bpm): 170   Movement: Present     General:  Alert, oriented and cooperative. Patient is in no acute distress.  Skin: Skin is warm and dry. No rash noted.   Cardiovascular: Normal heart rate noted  Respiratory: Normal respiratory effort, no problems with respiration noted  Abdomen: Soft, gravid, appropriate for gestational age.  Pain/Pressure: Present     Pelvic: Cervical exam deferred        Extremities: Normal range of motion.  Edema: Trace  Mental Status: Normal mood and affect. Normal behavior. Normal judgment and thought content.  Pt informed that the ultrasound is considered a limited OB ultrasound and is not intended to be a complete ultrasound exam.  Patient also informed that the ultrasound is not being completed with the intent of assessing for fetal or placental anomalies or any pelvic abnormalities.  Explained that the purpose of today's ultrasound is to assess for   presentation.  Patient acknowledges the purpose of the exam and the limitations of the study.    Bedside US: cephalic  Assessment and Plan:  Pregnancy: [id number] at [redacted]w[redacted]d  1. Vaginal discharge during pregnancy, antepartum - Cervicovaginal ancillary only( [place])  2. Supervision of high risk pregnancy, antepartum cephalic  3. History of preterm delivery, currently pregnant S/p Makena  4. H/O: substance abuse (HCC) No meds  5. Bipolar 1 disorder (HCC) On zoloft  Term labor symptoms and general obstetric precautions including but not limited to vaginal bleeding, contractions, leaking of fluid and fetal movement were reviewed in detail with the patient. Please refer to After Visit Summary for other counseling recommendations.   Return in about 1 week (around 05/20/2018) for OB visit.  No future appointments.  Conan Bowens, MD

## 2018-05-13 NOTE — Progress Notes (Signed)
Pt is here for ROB. Pt states she has been having abnormal vaginal discharge for about 3 days.

## 2018-05-14 LAB — CERVICOVAGINAL ANCILLARY ONLY
Bacterial vaginitis: NEGATIVE
Candida vaginitis: NEGATIVE
Chlamydia: NEGATIVE
Neisseria Gonorrhea: NEGATIVE
Trichomonas: NEGATIVE

## 2018-05-20 ENCOUNTER — Ambulatory Visit (INDEPENDENT_AMBULATORY_CARE_PROVIDER_SITE_OTHER): Payer: Medicaid Other | Admitting: Obstetrics & Gynecology

## 2018-05-20 ENCOUNTER — Other Ambulatory Visit: Payer: Self-pay

## 2018-05-20 DIAGNOSIS — O09219 Supervision of pregnancy with history of pre-term labor, unspecified trimester: Principal | ICD-10-CM

## 2018-05-20 DIAGNOSIS — O0990 Supervision of high risk pregnancy, unspecified, unspecified trimester: Secondary | ICD-10-CM

## 2018-05-20 DIAGNOSIS — O0993 Supervision of high risk pregnancy, unspecified, third trimester: Secondary | ICD-10-CM

## 2018-05-20 DIAGNOSIS — O09213 Supervision of pregnancy with history of pre-term labor, third trimester: Secondary | ICD-10-CM

## 2018-05-20 DIAGNOSIS — O09899 Supervision of other high risk pregnancies, unspecified trimester: Secondary | ICD-10-CM

## 2018-05-20 DIAGNOSIS — Z3A38 38 weeks gestation of pregnancy: Secondary | ICD-10-CM

## 2018-05-20 NOTE — Progress Notes (Signed)
   TELEHEALTH VIRTUAL OBSTETRICS VISIT ENCOUNTER NOTE  I connected with Azarriah Verderosa [place] on 05/20/18 at  8:30 AM EDT by telephone at home and verified that I am speaking with the correct person using two identifiers.   I discussed the limitations, risks, security and privacy concerns of performing an evaluation and management service by telephone and the availability of in person appointments. I also discussed with the patient that there may be a patient responsible charge related to this service. The patient expressed understanding and agreed to proceed.  Subjective:  Taylor Buck is a 28 y.o. [phone] at [redacted]w[redacted]d being followed for ongoing prenatal care.  She is currently monitored for the following issues for this high-risk pregnancy and has Supervision of high risk pregnancy, antepartum; History of preterm delivery, currently pregnant; Bipolar 1 disorder (HCC); History of depression; and H/O: substance abuse (HCC) on their problem list.  Patient reports occasional contractions. Reports fetal movement. Denies any contractions, bleeding or leaking of fluid.   The following portions of the patient's history were reviewed and updated as appropriate: allergies, current medications, past family history, past medical history, past social history, past surgical history and problem list.   Objective:   General:  Alert, oriented and cooperative.   Mental Status: Normal mood and affect perceived. Normal judgment and thought content.  Rest of physical exam deferred due to type of encounter  Assessment and Plan:  Pregnancy: [id number] at [redacted]w[redacted]d 1. History of preterm delivery, currently pregnant   2. Supervision of high risk pregnancy, antepartum H/o PT:  Term labor symptoms and general obstetric precautions including but not limited to vaginal bleeding, contractions, leaking of fluid and fetal movement were reviewed in detail with the patient.  I discussed the assessment and  treatment plan with the patient. The patient was provided an opportunity to ask questions and all were answered. The patient agreed with the plan and demonstrated an understanding of the instructions. The patient was advised to call back or seek an in-person office evaluation/go to [facility]U at [facility] for any urgent or concerning symptoms. Please refer to After Visit Summary for other counseling recommendations.   I provided 10 minutes of non-face-to-face time during this encounter.  Return in about 1 week (around 05/27/2018).  No future appointments.  Scheryl Darter, MD Center for [facility], [facility]

## 2018-05-20 NOTE — Patient Instructions (Signed)

## 2018-05-20 NOTE — Progress Notes (Signed)
Pt is on the phone preparing for telephone visit. [redacted]w[redacted]d.

## 2018-05-21 NOTE — Progress Notes (Signed)
This encounter was created in error - please disregard.

## 2018-05-24 ENCOUNTER — Encounter (HOSPITAL_COMMUNITY): Payer: Self-pay | Admitting: *Deleted

## 2018-05-24 ENCOUNTER — Other Ambulatory Visit: Payer: Self-pay

## 2018-05-24 ENCOUNTER — Inpatient Hospital Stay (HOSPITAL_COMMUNITY)
Admission: AD | Admit: 2018-05-24 | Discharge: 2018-05-24 | Disposition: A | Discharge status: Home / Self Care | Payer: Medicaid Other | Attending: Obstetrics and Gynecology | Admitting: Obstetrics and Gynecology

## 2018-05-24 DIAGNOSIS — O09219 Supervision of pregnancy with history of pre-term labor, unspecified trimester: Secondary | ICD-10-CM

## 2018-05-24 DIAGNOSIS — O479 False labor, unspecified: Secondary | ICD-10-CM

## 2018-05-24 DIAGNOSIS — Z8659 Personal history of other mental and behavioral disorders: Secondary | ICD-10-CM

## 2018-05-24 DIAGNOSIS — O471 False labor at or after 37 completed weeks of gestation: Secondary | ICD-10-CM | POA: Insufficient documentation

## 2018-05-24 DIAGNOSIS — F319 Bipolar disorder, unspecified: Secondary | ICD-10-CM

## 2018-05-24 DIAGNOSIS — F1911 Other psychoactive substance abuse, in remission: Secondary | ICD-10-CM

## 2018-05-24 DIAGNOSIS — Z3A38 38 weeks gestation of pregnancy: Secondary | ICD-10-CM | POA: Insufficient documentation

## 2018-05-24 DIAGNOSIS — O09899 Supervision of other high risk pregnancies, unspecified trimester: Secondary | ICD-10-CM

## 2018-05-24 NOTE — Progress Notes (Signed)
Pt states ctxs are less intense and spaced out. Is ready to go home and rest.

## 2018-05-24 NOTE — Progress Notes (Signed)
Dr Marlis Edelson notified of pt's admission and status. Will observe an hour and reck cervix.

## 2018-05-24 NOTE — Discharge Instructions (Signed)
Braxton Hicks Contractions Contractions of the uterus can occur throughout pregnancy, but they are not always a sign that you are in labor. You may have practice contractions called Braxton Hicks contractions. These false labor contractions are sometimes confused with true labor. What are Braxton Hicks contractions? Braxton Hicks contractions are tightening movements that occur in the muscles of the uterus before labor. Unlike true labor contractions, these contractions do not result in opening (dilation) and thinning of the cervix. Toward the end of pregnancy (32-34 weeks), Braxton Hicks contractions can happen more often and may become stronger. These contractions are sometimes difficult to tell apart from true labor because they can be very uncomfortable. You should not feel embarrassed if you go to the hospital with false labor. Sometimes, the only way to tell if you are in true labor is for your health care provider to look for changes in the cervix. The health care provider will do a physical exam and may monitor your contractions. If you are not in true labor, the exam should show that your cervix is not dilating and your water has not broken. If there are no other health problems associated with your pregnancy, it is completely safe for you to be sent home with false labor. You may continue to have Braxton Hicks contractions until you go into true labor. How to tell the difference between true labor and false labor True labor  Contractions last 30-70 seconds.  Contractions become very regular.  Discomfort is usually felt in the top of the uterus, and it spreads to the lower abdomen and low back.  Contractions do not go away with walking.  Contractions usually become more intense and increase in frequency.  The cervix dilates and gets thinner. False labor  Contractions are usually shorter and not as strong as true labor contractions.  Contractions are usually irregular.  Contractions  are often felt in the front of the lower abdomen and in the groin.  Contractions may go away when you walk around or change positions while lying down.  Contractions get weaker and are shorter-lasting as time goes on.  The cervix usually does not dilate or become thin. Follow these instructions at home:   Take over-the-counter and prescription medicines only as told by your health care provider.  Keep up with your usual exercises and follow other instructions from your health care provider.  Eat and drink lightly if you think you are going into labor.  If Braxton Hicks contractions are making you uncomfortable: ? Change your position from lying down or resting to walking, or change from walking to resting. ? Sit and rest in a tub of warm water. ? Drink enough fluid to keep your urine pale yellow. Dehydration may cause these contractions. ? Do slow and deep breathing several times an hour.  Keep all follow-up prenatal visits as told by your health care provider. This is important. Contact a health care provider if:  You have a fever.  You have continuous pain in your abdomen. Get help right away if:  Your contractions become stronger, more regular, and closer together.  You have fluid leaking or gushing from your vagina.  You pass blood-tinged mucus (bloody show).  You have bleeding from your vagina.  You have low back pain that you never had before.  You feel your baby's head pushing down and causing pelvic pressure.  Your baby is not moving inside you as much as it used to. Summary  Contractions that occur before labor are   called Braxton Hicks contractions, false labor, or practice contractions.  Braxton Hicks contractions are usually shorter, weaker, farther apart, and less regular than true labor contractions. True labor contractions usually become progressively stronger and regular, and they become more frequent.  Manage discomfort from Braxton Hicks contractions  by changing position, resting in a warm bath, drinking plenty of water, or practicing deep breathing. This information is not intended to replace advice given to you by your health care provider. Make sure you discuss any questions you have with your health care provider. Document Released: 05/30/2016 Document Revised: 10/29/2016 Document Reviewed: 05/30/2016 Elsevier Interactive Patient Education  2019 [employer]  

## 2018-05-24 NOTE — MAU Provider Note (Signed)
S: [name] is a 28 y.o. [phone] at [redacted]w[redacted]d  who presents to MAU today for labor evaluation.     Cervical exam by RN:  Dilation: 1.5 Effacement (%): 50 Cervical Position: Anterior Station: -2 Presentation: Vertex Exam by:: Quintella Baton RNc  Fetal Monitoring: Baseline: 125 Variability: moderate Accelerations: present  Decelerations: one variable  Contractions: irregular mild contractions   MDM Discussed patient with RN. NST reviewed.   A: SIUP at [redacted]w[redacted]d  False labor  P: Discharge home Labor precautions and kick counts included in AVS Patient to follow-up with [facility] as scheduled  Patient may return to MAU as needed or when in labor   Sharyon Cable, [place] 05/24/2018 3:33 AM

## 2018-05-24 NOTE — Progress Notes (Signed)
Pt up to BR at 0312

## 2018-05-24 NOTE — MAU Note (Signed)
Ctxs since 1900. Some nausea and has a headache. I took Tylenol Sat AM for sinus h/a and it helped but did not think I could take anymore. Denies LOF or bleeding. Has not  Had SVE

## 2018-05-24 NOTE — Progress Notes (Signed)
DR Earlene Plater notified pt states ctxs less intense and she would like to go home. MD will put in d/c order

## 2018-05-26 ENCOUNTER — Inpatient Hospital Stay (HOSPITAL_COMMUNITY): Payer: Medicaid Other | Admitting: Anesthesiology

## 2018-05-26 ENCOUNTER — Other Ambulatory Visit: Payer: Self-pay

## 2018-05-26 ENCOUNTER — Inpatient Hospital Stay (HOSPITAL_COMMUNITY)
Admission: AD | Admit: 2018-05-26 | Discharge: 2018-05-28 | DRG: 807 | Disposition: A | Discharge status: Home / Self Care | Payer: Medicaid Other | Attending: Family Medicine | Admitting: Family Medicine

## 2018-05-26 ENCOUNTER — Encounter (HOSPITAL_COMMUNITY): Payer: Self-pay

## 2018-05-26 DIAGNOSIS — Z880 Allergy status to penicillin: Secondary | ICD-10-CM | POA: Diagnosis not present

## 2018-05-26 DIAGNOSIS — Z87898 Personal history of other specified conditions: Secondary | ICD-10-CM | POA: Diagnosis present

## 2018-05-26 DIAGNOSIS — F319 Bipolar disorder, unspecified: Secondary | ICD-10-CM | POA: Diagnosis present

## 2018-05-26 DIAGNOSIS — F17210 Nicotine dependence, cigarettes, uncomplicated: Secondary | ICD-10-CM | POA: Diagnosis present

## 2018-05-26 DIAGNOSIS — Z3A39 39 weeks gestation of pregnancy: Secondary | ICD-10-CM

## 2018-05-26 DIAGNOSIS — O99344 Other mental disorders complicating childbirth: Secondary | ICD-10-CM | POA: Diagnosis present

## 2018-05-26 DIAGNOSIS — O99334 Smoking (tobacco) complicating childbirth: Secondary | ICD-10-CM | POA: Diagnosis present

## 2018-05-26 DIAGNOSIS — Z9189 Other specified personal risk factors, not elsewhere classified: Secondary | ICD-10-CM

## 2018-05-26 DIAGNOSIS — Z8659 Personal history of other mental and behavioral disorders: Secondary | ICD-10-CM

## 2018-05-26 DIAGNOSIS — F1911 Other psychoactive substance abuse, in remission: Secondary | ICD-10-CM

## 2018-05-26 DIAGNOSIS — O26893 Other specified pregnancy related conditions, third trimester: Secondary | ICD-10-CM | POA: Diagnosis present

## 2018-05-26 DIAGNOSIS — O09899 Supervision of other high risk pregnancies, unspecified trimester: Secondary | ICD-10-CM

## 2018-05-26 DIAGNOSIS — O09219 Supervision of pregnancy with history of pre-term labor, unspecified trimester: Secondary | ICD-10-CM

## 2018-05-26 LAB — CBC
HCT: 36.8 % (ref 36.0–46.0)
Hemoglobin: 12.2 g/dL (ref 12.0–15.0)
MCH: 28.5 pg (ref 26.0–34.0)
MCHC: 33.2 g/dL (ref 30.0–36.0)
MCV: 86 fL (ref 80.0–100.0)
Platelets: 283 10*3/uL (ref 150–400)
RBC: 4.28 MIL/uL (ref 3.87–5.11)
RDW: 14 % (ref 11.5–15.5)
WBC: 14.9 10*3/uL — ABNORMAL HIGH (ref 4.0–10.5)
nRBC: 0 % (ref 0.0–0.2)

## 2018-05-26 MED ORDER — LACTATED RINGERS IV SOLN
INTRAVENOUS | Status: DC
  Administered 2018-05-26 – 2018-05-27 (×2): via INTRAVENOUS

## 2018-05-26 MED ORDER — FENTANYL-BUPIVACAINE-NACL 0.5-0.125-0.9 MG/250ML-% EP SOLN: 12.0000 mL/h | EPIDURAL | Status: DC | PRN

## 2018-05-26 MED ORDER — FENTANYL CITRATE (PF) 100 MCG/2ML IJ SOLN: 100.0000 ug | INTRAMUSCULAR | Status: DC | PRN

## 2018-05-26 MED ORDER — EPHEDRINE 5 MG/ML INJ: 10.0000 mg | INTRAVENOUS | Status: DC | PRN

## 2018-05-26 MED ORDER — LACTATED RINGERS IV SOLN: 500.0000 mL | INTRAVENOUS | Status: DC | PRN

## 2018-05-26 MED ORDER — PHENYLEPHRINE 40 MCG/ML (10ML) SYRINGE FOR IV PUSH (FOR BLOOD PRESSURE SUPPORT): 80.0000 ug | PREFILLED_SYRINGE | INTRAVENOUS | Status: DC | PRN

## 2018-05-26 MED ORDER — OXYTOCIN BOLUS FROM INFUSION
500.0000 mL | Freq: Once | INTRAVENOUS | Status: AC
  Administered 2018-05-27: 05:00:00 500 mL via INTRAVENOUS

## 2018-05-26 MED ORDER — LIDOCAINE HCL (PF) 1 % IJ SOLN
INTRAMUSCULAR | Status: DC | PRN
  Administered 2018-05-26: 5 mL via EPIDURAL

## 2018-05-26 MED ORDER — SOD CITRATE-CITRIC ACID 500-334 MG/5ML PO SOLN: 30.0000 mL | ORAL | Status: DC | PRN

## 2018-05-26 MED ORDER — PHENYLEPHRINE 40 MCG/ML (10ML) SYRINGE FOR IV PUSH (FOR BLOOD PRESSURE SUPPORT)
80.0000 ug | PREFILLED_SYRINGE | INTRAVENOUS | Status: DC | PRN
  Filled 2018-05-26: qty 10

## 2018-05-26 MED ORDER — ACETAMINOPHEN 325 MG PO TABS
650.0000 mg | ORAL_TABLET | ORAL | Status: DC | PRN
  Administered 2018-05-26 – 2018-05-27 (×2): 650 mg via ORAL
  Filled 2018-05-26 (×2): qty 2

## 2018-05-26 MED ORDER — LIDOCAINE HCL (PF) 1 % IJ SOLN: 30.0000 mL | INTRAMUSCULAR | Status: DC | PRN

## 2018-05-26 MED ORDER — FENTANYL-BUPIVACAINE-NACL 0.5-0.125-0.9 MG/250ML-% EP SOLN
EPIDURAL | Status: AC
  Filled 2018-05-26: qty 250

## 2018-05-26 MED ORDER — DIPHENHYDRAMINE HCL 50 MG/ML IJ SOLN: 12.5000 mg | INTRAMUSCULAR | Status: DC | PRN

## 2018-05-26 MED ORDER — SODIUM CHLORIDE (PF) 0.9 % IJ SOLN
INTRAMUSCULAR | Status: DC | PRN
  Administered 2018-05-26: 12 mL/h via EPIDURAL

## 2018-05-26 MED ORDER — ONDANSETRON HCL 4 MG/2ML IJ SOLN: 4.0000 mg | Freq: Four times a day (QID) | INTRAMUSCULAR | Status: DC | PRN

## 2018-05-26 MED ORDER — LACTATED RINGERS IV SOLN
500.0000 mL | Freq: Once | INTRAVENOUS | Status: AC
  Administered 2018-05-26: 22:00:00 500 mL via INTRAVENOUS

## 2018-05-26 MED ORDER — OXYTOCIN 40 UNITS IN NORMAL SALINE INFUSION - SIMPLE MED
2.5000 [IU]/h | INTRAVENOUS | Status: DC
  Administered 2018-05-27: 06:00:00 2.5 [IU]/h via INTRAVENOUS
  Filled 2018-05-26: qty 1000

## 2018-05-26 NOTE — Anesthesia Preprocedure Evaluation (Addendum)
Anesthesia Evaluation  Patient identified by MRN, date of birth, ID band Patient awake    Reviewed: Allergy & Precautions, H&P , NPO status , reviewed documented beta blocker date and time   Airway Mallampati: II  TM Distance: >3 FB Neck ROM: Full    Dental no notable dental hx. (+) Teeth Intact   Pulmonary asthma , Current Smoker,    Pulmonary exam normal breath sounds clear to auscultation       Cardiovascular Exercise Tolerance: Good negative cardio ROS Normal cardiovascular exam Rhythm:Regular Rate:Normal     Neuro/Psych Bipolar Disorder    GI/Hepatic (+)     substance abuse  ,   Endo/Other    Renal/GU      Musculoskeletal   Abdominal   Peds  Hematology Hgb 12.2 Plt 203   Anesthesia Other Findings All Multiple  Reproductive/Obstetrics (+) Pregnancy                            Anesthesia Physical Anesthesia Plan  ASA: III  Anesthesia Plan: Epidural   Post-op Pain Management:    Induction:   PONV Risk Score and Plan:   Airway Management Planned:   Additional Equipment:   Intra-op Plan:   Post-operative Plan:   Informed Consent: I have reviewed the patients History and Physical, chart, labs and discussed the procedure including the risks, benefits and alternatives for the proposed anesthesia with the patient or authorized representative who has indicated his/her understanding and acceptance.       Plan Discussed with:   Anesthesia Plan Comments: (G6P0 w hx of substance abuse , asthma for LEA)        Anesthesia Quick Evaluation

## 2018-05-26 NOTE — Anesthesia Procedure Notes (Addendum)
Epidural Patient location during procedure: OB Start time: 05/26/2018 10:07 PM End time: 05/26/2018 10:21 PM  Staffing Anesthesiologist: Trevor Iha, MD Performed: anesthesiologist   Preanesthetic Checklist Completed: patient identified, site marked, surgical consent, pre-op evaluation, timeout performed, IV checked, risks and benefits discussed and monitors and equipment checked  Epidural Patient position: sitting Prep: site prepped and draped and DuraPrep Patient monitoring: continuous pulse ox and blood pressure Approach: midline Location: L2-L3 Injection technique: LOR air  Needle:  Needle type: Tuohy  Needle gauge: 17 G Needle length: 9 cm and 9 Needle insertion depth: 7 cm Catheter type: closed end flexible Catheter size: 19 Gauge Catheter at skin depth: 12 cm Test dose: negative  Assessment Events: blood not aspirated, injection not painful, no injection resistance, negative IV test and no paresthesia  Additional Notes Patient identified. Risks/Benefits/Options discussed with patient including but not limited to bleeding, infection, nerve damage, paralysis, failed block, incomplete pain control, headache, blood pressure changes, nausea, vomiting, reactions to medication both or allergic, itching and postpartum back pain. Confirmed with bedside nurse the patient's most recent platelet count. Confirmed with patient that they are not currently taking any anticoagulation, have any bleeding history or any family history of bleeding disorders. Patient expressed understanding and wished to proceed. All questions were answered. Sterile technique was used throughout the entire procedure. Please see nursing notes for vital signs. Test dose was given through epidural needle and negative prior to continuing to dose epidural or start infusion. Warning signs of high block given to the patient including shortness of breath, tingling/numbness in hands, complete motor block, or any  concerning symptoms with instructions to call for help. Patient was given instructions on fall risk and not to get out of bed. All questions and concerns addressed with instructions to call with any issues. 1 Attempt (S) . Patient tolerated procedure well.

## 2018-05-26 NOTE — H&P (Signed)
LABOR AND DELIVERY ADMISSION HISTORY AND PHYSICAL NOTE  Taylor Buck is a 28 y.o. female [phone] with IUP at [redacted]w[redacted]d by LMP=11w [place] presenting for SOL.  She reports positive fetal movement. She denies leakage of fluid or vaginal bleeding.  Prenatal History/Complications: PNC at [facility] Pregnancy complications:  - hx of substance abuse - hx of preterm delivery - bipolar disorder  Past Medical History: Past Medical History:  Diagnosis Date  . Anxiety   . Arthritis   . Asthma   . Depression   . Heart murmur   . Infection    chlamydia age 55  . PCOS (polycystic ovarian syndrome)   . Sleep apnea   . Urinary tract infection     Past Surgical History: Past Surgical History:  Procedure Laterality Date  . HAND SURGERY     reconstruction- injury due to frostbite as child  . HERNIA REPAIR     rt inguinal    Obstetrical History: OB History    Gravida  6   Para  1   Term  0   Preterm  1   AB  4   Living  1     SAB  4   TAB      Ectopic      Multiple      Live Births  1           Social History: Social History   Socioeconomic History  . Marital status: Single    Spouse name: Not on file  . Number of children: Not on file  . Years of education: Not on file  . Highest education level: Not on file  Occupational History  . Not on file  Social Needs  . Financial resource strain: Not on file  . Food insecurity:    Worry: Not on file    Inability: Not on file  . Transportation needs:    Medical: Not on file    Non-medical: Not on file  Tobacco Use  . Smoking status: Current Every Day Smoker    Packs/day: 0.25    Types: Cigarettes  . Smokeless tobacco: Never Used  . Tobacco comment: 1 PACK A DAY - 4 CIGARETTES A DAY   Substance and Sexual Activity  . Alcohol use: No  . Drug use: Not Currently    Types: Marijuana    Comment: UP UNTIL +UPT  . Sexual activity: Yes    Birth control/protection: None  Lifestyle  . Physical activity:     Days per week: Not on file    Minutes per session: Not on file  . Stress: Not on file  Relationships  . Social connections:    Talks on phone: Not on file    Gets together: Not on file    Attends religious service: Not on file    Active member of club or organization: Not on file    Attends meetings of clubs or organizations: Not on file    Relationship status: Not on file  Other Topics Concern  . Not on file  Social History Narrative  . Not on file    Family History: Family History  Problem Relation Age of Onset  . Diabetes Mother   . COPD Mother   . Diabetes Maternal Aunt   . Cancer Maternal Aunt        breast  . Diabetes Maternal Grandmother   . Cancer Maternal Grandmother        skin cancer  . Alcoholism Other   .  Arthritis Other   . Asthma Other   . Depression Other   . Hearing loss Neg Hx     Allergies: Allergies  Allergen Reactions  . Milk-Related Compounds Anaphylaxis  . Latex Hives  . Penicillins Hives    Has patient had a PCN reaction causing immediate rash, facial/tongue/throat swelling, SOB or lightheadedness with hypotension: Yes Has patient had a PCN reaction causing severe rash involving mucus membranes or skin necrosis: No Has patient had a PCN reaction that required hospitalization: No Has patient had a PCN reaction occurring within the last 10 years: No If all of the above answers are "NO", then may proceed with Cephalosporin use.   Marland Kitchen Phenergan [Promethazine Hcl]     Panic attack  . Reglan [Metoclopramide] Other (See Comments)    Panic attack    Medications Prior to Admission  Medication Sig Dispense Refill Last Dose  . acetaminophen (TYLENOL) 325 MG tablet Take 650 mg by mouth daily as needed for pain.   05/23/2018 at Unknown time  . calcium carbonate (TUMS EX) 750 MG chewable tablet Chew 1 tablet by mouth at bedtime as needed (for nausea).   Past Week at Unknown time  . omeprazole (PRILOSEC) 20 MG capsule Take 1 capsule (20 mg total) by  mouth daily. 60 capsule 5 Past Month at Unknown time  . Prenatal Vit-Fe Phos-FA-Omega (VITAFOL GUMMIES) 3.33-0.333-34.8 MG CHEW Chew 3 tablets by mouth daily. 90 tablet 2 05/23/2018 at Unknown time  . sertraline (ZOLOFT) 25 MG tablet Take 1 tablet (25 mg total) by mouth daily. Take one 25 mg tab in addition to the 100 mg tab daily, to make 125 mg daily. 30 tablet 2 05/23/2018 at Unknown time     Review of Systems  All systems reviewed and negative except as stated in HPI  Physical Exam Blood pressure 132/72, pulse 75, temperature 98.5 F (36.9 C), temperature source Oral, resp. rate 18, height  (1.702 m), weight 96.4 kg, last menstrual period 08/25/2017, SpO2 97 %, unknown if currently breastfeeding. General appearance: alert, oriented, NAD Lungs: normal respiratory effort Heart: regular rate Abdomen: soft, non-tender; gravid, FH appropriate for GA Extremities: No calf swelling or tenderness Presentation: cephalic Fetal monitoring: baseline 130/mod var/+ acels/variable decels Uterine activity: q1-36m Dilation: 3.5 Effacement (%): 80 Station: -2 Exam by:: Bari Mantis RN   Prenatal labs: ABO, Rh: A/Positive/-- (10/10 1658) Antibody: Negative (10/10 1658) Rubella: 10.60 (10/10 1658) RPR: Non Reactive (03/19 0959)  HBsAg: Negative (03/19 0959)  HIV: Non Reactive (03/19 0959)  GC/Chlamydia: Negative GBS: Negative (04/09 1116)  2-hr GTT: 76/125/77 Genetic screening:  Low risk Anatomy US: normal  Prenatal Transfer Tool  Maternal Diabetes: No Genetic Screening: Normal Maternal Ultrasounds/Referrals: Normal Fetal Ultrasounds or other Referrals:  None Maternal Substance Abuse:  No Significant Maternal Medications:  None Significant Maternal Lab Results: None  No results found for this or any previous visit (from the past 24 hour(s)).  Patient Active Problem List   Diagnosis Date Noted  . H/O: substance abuse (HCC) 03/05/2018  . History of preterm delivery, currently pregnant  12/10/2017  . Bipolar 1 disorder (HCC) 12/10/2017  . History of depression 12/10/2017  . Supervision of high risk pregnancy, antepartum 11/06/2017    Assessment: Taylor Buck is a 28 y.o. [id number] at [redacted]w[redacted]d here for SOL  #Labor: progressing. Expectant management.  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  Would like to avoid narcotics if possible.   Jamieon Lannen,MD 05/26/2018, 8:54 PM

## 2018-05-26 NOTE — MAU Note (Signed)
Taylor Buck is a 28 y.o. at [redacted]w[redacted]d here in MAU reporting: painful uc's since 1400, states they come every 3 min. No bleeding or LOF, reports mucus discharge. + FM  Onset of complaint: 1400 today  Pain score: 6/10  Vitals:   05/26/18 1901  BP: 132/72  Pulse: 75  Resp: 18  Temp: 98.5 F (36.9 C)  SpO2: 97%      Lab orders placed from triage: none

## 2018-05-27 ENCOUNTER — Encounter: Payer: Medicaid Other | Admitting: Obstetrics & Gynecology

## 2018-05-27 ENCOUNTER — Encounter (HOSPITAL_COMMUNITY): Payer: Self-pay

## 2018-05-27 DIAGNOSIS — Z3A39 39 weeks gestation of pregnancy: Secondary | ICD-10-CM

## 2018-05-27 LAB — ABO/RH: ABO/RH(D): A POS

## 2018-05-27 LAB — RPR: RPR Ser Ql: NONREACTIVE

## 2018-05-27 LAB — TYPE AND SCREEN
ABO/RH(D): A POS
Antibody Screen: NEGATIVE

## 2018-05-27 MED ORDER — SIMETHICONE 80 MG PO CHEW: 80.0000 mg | CHEWABLE_TABLET | ORAL | Status: DC | PRN

## 2018-05-27 MED ORDER — ONDANSETRON HCL 4 MG/2ML IJ SOLN: 4.0000 mg | INTRAMUSCULAR | Status: DC | PRN

## 2018-05-27 MED ORDER — BENZOCAINE-MENTHOL 20-0.5 % EX AERO: 1.0000 "application " | INHALATION_SPRAY | CUTANEOUS | Status: DC | PRN

## 2018-05-27 MED ORDER — SERTRALINE HCL 25 MG PO TABS
125.0000 mg | ORAL_TABLET | Freq: Every day | ORAL | Status: DC
  Administered 2018-05-27 – 2018-05-28 (×2): 125 mg via ORAL
  Filled 2018-05-27 (×3): qty 1

## 2018-05-27 MED ORDER — IBUPROFEN 600 MG PO TABS
600.0000 mg | ORAL_TABLET | Freq: Four times a day (QID) | ORAL | Status: DC
  Administered 2018-05-27 – 2018-05-28 (×5): 600 mg via ORAL
  Filled 2018-05-27 (×5): qty 1

## 2018-05-27 MED ORDER — COCONUT OIL OIL: 1.0000 "application " | TOPICAL_OIL | Status: DC | PRN

## 2018-05-27 MED ORDER — DIBUCAINE (PERIANAL) 1 % EX OINT: 1.0000 "application " | TOPICAL_OINTMENT | CUTANEOUS | Status: DC | PRN

## 2018-05-27 MED ORDER — ACETAMINOPHEN 325 MG PO TABS
650.0000 mg | ORAL_TABLET | ORAL | Status: DC | PRN
  Administered 2018-05-27 – 2018-05-28 (×3): 650 mg via ORAL
  Filled 2018-05-27 (×3): qty 2

## 2018-05-27 MED ORDER — WITCH HAZEL-GLYCERIN EX PADS: 1.0000 "application " | MEDICATED_PAD | CUTANEOUS | Status: DC | PRN

## 2018-05-27 MED ORDER — ONDANSETRON HCL 4 MG PO TABS
4.0000 mg | ORAL_TABLET | ORAL | Status: DC | PRN
  Administered 2018-05-27: 16:00:00 4 mg via ORAL
  Filled 2018-05-27: qty 1

## 2018-05-27 MED ORDER — PRENATAL MULTIVITAMIN CH
1.0000 | ORAL_TABLET | Freq: Every day | ORAL | Status: DC
  Administered 2018-05-27 – 2018-05-28 (×2): 1 via ORAL
  Filled 2018-05-27 (×2): qty 1

## 2018-05-27 MED ORDER — DIPHENHYDRAMINE HCL 25 MG PO CAPS: 25.0000 mg | ORAL_CAPSULE | Freq: Four times a day (QID) | ORAL | Status: DC | PRN

## 2018-05-27 MED ORDER — TETANUS-DIPHTH-ACELL PERTUSSIS 5-2.5-18.5 LF-MCG/0.5 IM SUSP: 0.5000 mL | Freq: Once | INTRAMUSCULAR | Status: DC

## 2018-05-27 MED ORDER — MEASLES, MUMPS & RUBELLA VAC IJ SOLR: 0.5000 mL | Freq: Once | INTRAMUSCULAR | Status: DC

## 2018-05-27 MED ORDER — SENNOSIDES-DOCUSATE SODIUM 8.6-50 MG PO TABS
2.0000 | ORAL_TABLET | ORAL | Status: DC
  Administered 2018-05-27: 2 via ORAL
  Filled 2018-05-27: qty 2

## 2018-05-27 NOTE — Lactation Note (Signed)
This note was copied from a baby's chart. Lactation Consultation Note  Patient Name: Taylor Buck [id number] Date: 05/27/2018 Reason for consult: Initial assessment  1755 - 1819 - I visited Taylor Buck to assist with breast feeding. She states that baby latched after delivery, and that her RN assisted with latching baby "Jonna Clark" around [phone]. Mom states that baby fed on each side for about one hour total.  Baby was showing feeding cues in her bassinet. I assisted mom with latching Jonna Clark on her right breast in cross cradle hold. I showed mom how to make a breast sandwich and how to gently pester baby to keep her active at the breast. Mom's nipples flatten with compression, and I showed her how to grasp breast slightly further back from the nipple to help nipples stay evert.  I noted good breast tissue movement and jaw excursions.  I provided basic breast feeding education including discussion of infant feeding patterns in the first days of breast feeding, output expectations, feeding frequency and duration, and nighttime cluster feeding patterns.  Mom states that she has a Medela pump at home. Baby has had good output in the first 24 hours, and mom states that baby's stools are transitioning to brown.  I recommended that mom feed baby 8-12 times a day on demand and wake to feed as needed. Mom verbalized understanding.   Maternal Data Formula Feeding for Exclusion: No Has patient been taught Hand Expression?: Yes Does the patient have breastfeeding experience prior to this delivery?: Yes  Feeding Feeding Type: Breast Fed  LATCH Score Latch: Grasps breast easily, tongue down, lips flanged, rhythmical sucking.  Audible Swallowing: A few with stimulation  Type of Nipple: Everted at rest and after stimulation  Comfort (Breast/Nipple): Soft / non-tender  Hold (Positioning): Assistance needed to correctly position infant at breast and maintain latch.  LATCH  Score: 8  Interventions Interventions: Breast feeding basics reviewed;Assisted with latch;Skin to skin;Breast massage;Hand express;Breast compression;Adjust position;Support pillows  Lactation Tools Discussed/Used     Consult Status Consult Status: Follow-up Date: 05/28/18 Follow-up type: In-patient    Walker Shadow 05/27/2018, 6:41 PM

## 2018-05-27 NOTE — Anesthesia Postprocedure Evaluation (Signed)
Anesthesia Post Note  Patient: Taylor Buck  Procedure(s) Performed: AN AD HOC LABOR EPIDURAL     Patient location during evaluation: Mother Baby Anesthesia Type: Epidural Level of consciousness: awake, awake and alert and oriented Pain management: pain level controlled Vital Signs Assessment: post-procedure vital signs reviewed and stable Respiratory status: spontaneous breathing and respiratory function stable Cardiovascular status: blood pressure returned to baseline Postop Assessment: no headache, no backache, epidural receding, patient able to bend at knees, no apparent nausea or vomiting, adequate PO intake and able to ambulate Anesthetic complications: no    Last Vitals:  Vitals:   05/27/18 0809 05/27/18 0900  BP: 110/76 104/83  Pulse: (!) 59 (!) 59  Resp: 16 18  Temp: 37.3 C 37.2 C  SpO2:      Last Pain:  Vitals:   05/27/18 1141  TempSrc:   PainSc: 4    Pain Goal: Patients Stated Pain Goal: 2 (05/27/18 1141)                 Cleda Clarks

## 2018-05-27 NOTE — Discharge Summary (Signed)
Postpartum Discharge Summary     Patient Name: Taylor Buck DOB: 1990-08-01 MRN: [id number]  Date of admission: 05/26/2018 Delivering Provider: Gwenevere AbbotPHILLIP, Kyrsten Deleeuw   Date of discharge: 05/28/2018  Admitting diagnosis: CTX 3 MIN  Intrauterine pregnancy: 6061w2d     Secondary diagnosis:  Principal Problem:   Normal labor Active Problems:   Bipolar 1 disorder (HCC)   H/O: substance abuse (HCC)  Additional problems: None     Discharge diagnosis: Term Pregnancy Delivered                                                                                                Post partum procedures: None  Augmentation: AROM  Complications: None  Hospital course:  Onset of Labor With Vaginal Delivery     28 y.o. yo [phone] at 5661w2d was admitted in Active Labor on 05/26/2018. Patient had an uncomplicated labor course as follows:  Membrane Rupture Time/Date: 2:29 AM ,05/27/2018   Intrapartum Procedures: Episiotomy: None [1]                                         Lacerations:  None [1]  Patient had a delivery of a Viable infant. 05/27/2018  Information for the patient's newborn:  De HollingsheadJernigan[place], Girl Keishia [[id number]][[id number]]  Delivery Method: Vaginal, Spontaneous(Filed from Delivery Summary)    Pateint had an uncomplicated postpartum course.  She is ambulating, tolerating a regular diet, passing flatus, and urinating well. Patient is discharged home in stable condition on 05/28/18.   Magnesium Sulfate recieved: No BMZ received: No  Physical exam  Vitals:   05/27/18 1800 05/27/18 2125 05/27/18 2255 05/28/18 0615  BP: 116/76 130/72 112/72 112/68  Pulse: (!) 56 62 (!) 57 (!) 57  Resp: 18 20 20 18   Temp: 99.3 F (37.4 C) 99.2 F (37.3 C) 99 F (37.2 C) 99.1 F (37.3 C)  TempSrc: Oral Oral Oral Oral  SpO2:      Weight:      Height:       General: alert, cooperative and no distress Lochia: appropriate Uterine Fundus: firm Incision: N/A DVT Evaluation: No evidence of DVT  seen on physical exam. Labs: Lab Results  Component Value Date   WBC 14.9 (H) 05/26/2018   HGB 12.2 05/26/2018   HCT 36.8 05/26/2018   MCV 86.0 05/26/2018   PLT 283 05/26/2018   CMP Latest Ref Rng & Units 04/25/2018  Glucose 70 - 99 mg/dL 91  BUN 6 - 20 mg/dL <1(H<5(L)  Creatinine 0.860.44 - 1.00 mg/dL 5.780.61  Sodium 469135 - 629145 mmol/L 134(L)  Potassium 3.5 - 5.1 mmol/L 3.5  Chloride 98 - 111 mmol/L 106  CO2 22 - 32 mmol/L 22  Calcium 8.9 - 10.3 mg/dL 5.2(W8.6(L)  Total Protein 6.5 - 8.1 g/dL 4.1(L5.4(L)  Total Bilirubin 0.3 - 1.2 mg/dL 0.3  Alkaline Phos 38 - 126 U/L 128(H)  AST 15 - 41 U/L 18  ALT 0 - 44 U/L 22    Discharge instruction: per After Visit Summary and "  Baby and Me Booklet".  After visit meds:  Allergies as of 05/28/2018      Reactions   Milk-related Compounds Anaphylaxis   Latex Hives   Penicillins Hives   Has patient had a PCN reaction causing immediate rash, facial/tongue/throat swelling, SOB or lightheadedness with hypotension: Yes Has patient had a PCN reaction causing severe rash involving mucus membranes or skin necrosis: No Has patient had a PCN reaction that required hospitalization: No Has patient had a PCN reaction occurring within the last 10 years: No If all of the above answers are "NO", then may proceed with Cephalosporin use.   Phenergan [promethazine Hcl]    Panic attack   Reglan [metoclopramide] Other (See Comments)   Panic attack      Medication List    TAKE these medications   acetaminophen 325 MG tablet Commonly known as:  TYLENOL Take 650 mg by mouth daily as needed for pain.   calcium carbonate 750 MG chewable tablet Commonly known as:  TUMS EX Chew 1 tablet by mouth at bedtime as needed (for nausea).   ibuprofen 600 MG tablet Commonly known as:  ADVIL Take 1 tablet (600 mg total) by mouth every 6 (six) hours.   omeprazole 20 MG capsule Commonly known as:  PriLOSEC Take 1 capsule (20 mg total) by mouth daily.   sertraline 25 MG  tablet Commonly known as:  Zoloft Take 1 tablet (25 mg total) by mouth daily. Take one 25 mg tab in addition to the 100 mg tab daily, to make 125 mg daily. What changed:  how much to take   Vitafol Gummies 3.33-0.333-34.8 MG Chew Chew 3 tablets by mouth daily.       Diet: routine diet  Activity: Advance as tolerated. Pelvic rest for 6 weeks.   Outpatient follow up:4 weeks Follow up Appt: Future Appointments  Date Time Provider Department Center  06/09/2018  9:15 AM Hermina Staggers, MD [facility] None  06/25/2018 11:00 AM Adam Phenix, MD [facility] None   Follow up Visit: Please schedule this patient for Postpartum visit in: 4 weeks with the following provider: Any provider For C/S patients schedule nurse incision check in weeks 2 weeks: no Low risk pregnancy complicated by: hx of preterm delivery, hx of substance abuse, bipolar disorder Delivery mode:  SVD Anticipated Birth Control:  IUD PP Procedures needed: Mood check  Schedule Integrated BH visit: yes   Newborn Data: Live born female  Birth Weight: 3100 g  APGAR: 9, 9  Newborn Delivery   Birth date/time:  05/27/2018 05:08:00 Delivery type:  Vaginal, Spontaneous     Baby Feeding: Breast Disposition:home with mother   05/28/2018 Gwenevere Abbot, MD

## 2018-05-27 NOTE — Progress Notes (Signed)
LABOR PROGRESS NOTE  Taylor Buck is a 28 y.o. [phone] at [redacted]w[redacted]d admitted for SOL  Subjective: Feeling better now Was having severe pain on the left, but improved Not feeling pressure yet, but does feel moisture like water broke  Objective: BP (!) 94/57   Pulse (!) 52   Temp 98.2 F (36.8 C)   Resp 16   Ht 5\' 7"  (1.702 m)   Wt 96.4 kg   LMP 08/25/2017   SpO2 100%   BMI 33.30 kg/m  or  Vitals:   05/26/18 2301 05/26/18 2332 05/27/18 0001 05/27/18 0031  BP: (!) 119/47 (!) 91/53 98/63 (!) 94/57  Pulse: (!) 55 (!) 52 (!) 50 (!) 52  Resp: 18 16 16    Temp:      TempSrc:      SpO2:      Weight:      Height:        Dilation: 10 Dilation Complete Date: 05/27/18 Dilation Complete Time: 0228 Effacement (%): 100 Cervical Position: Anterior Station: 0 Presentation: Vertex Exam by:: Aneta Mins MD  FHT: baseline rate 120s, moderate varibility, + acel, variable decel Toco: q2-66m  Labs: Lab Results  Component Value Date   WBC 14.9 (H) 05/26/2018   HGB 12.2 05/26/2018   HCT 36.8 05/26/2018   MCV 86.0 05/26/2018   PLT 283 05/26/2018    Patient Active Problem List   Diagnosis Date Noted  . Normal labor 05/26/2018  . H/O: substance abuse (HCC) 03/05/2018  . History of preterm delivery, currently pregnant 12/10/2017  . Bipolar 1 disorder (HCC) 12/10/2017  . History of depression 12/10/2017  . Supervision of high risk pregnancy, antepartum 11/06/2017    Assessment / Plan: 28 y.o. [id number] at [redacted]w[redacted]d here for SOL  Labor: progressing well.  AROM now. Will allow patient to labor down until she feels pressure like she's ready to push.  Fetal Wellbeing:  Cat 2 (reassuring) Pain Control:  Mostly comfortable with epidural Anticipated MOD:  SVD  Rosser Collington,MD OB Fellow  05/27/2018, 2:33 AM

## 2018-05-28 MED ORDER — IBUPROFEN 600 MG PO TABS: 600.0000 mg | ORAL_TABLET | Freq: Four times a day (QID) | ORAL | 0 refills | Status: DC

## 2018-05-28 NOTE — Clinical Social Work Maternal (Signed)
CLINICAL SOCIAL WORK MATERNAL/CHILD NOTE  Patient Details  Name: Taylor Buck MRN: [id number] Date of Birth: 05/27/2018  Date:  05/28/2018  Clinical Social Worker Initiating Note:  Jishnu Jenniges, LCSWA  Date/Time: Initiated:  05/28/18/1215     Child's Name:  Taylor Buck    Biological Parents:  Mother(Taylor Buck )   Need for Interpreter:  None   Reason for Referral:  Other (Comment)(anxiety/depression/bipolar)   Address:  [address] [place] [address]    Phone number:  [phone] (home)     Additional phone number: none  Household Members/Support Persons (HM/SP):   (Per MOB she lives with sister, son, and possibly sisters children.)   HM/SP Name Relationship DOB or Age  HM/SP -1        HM/SP -2        HM/SP -3        HM/SP -4        HM/SP -5        HM/SP -6        HM/SP -7        HM/SP -8          Natural Supports (not living in the home):  Immediate Family   Professional Supports: Therapist(Jamie with [facility])   Employment: Unemployed   Type of Work: unemployeed   Education:  Some College   Homebound arranged:  n/a  Financial Resources:  Medicaid   Other Resources:  Food Stamps , WIC   Cultural/Religious Considerations Which May Impact Care:  none reported.  Strengths:  Ability to meet basic needs , Compliance with medical plan , Home prepared for child , Psychotropic Medications   Psychotropic Medications:  Zoloft, Celexa      Pediatrician:       Pediatrician List:   [place]    [place]    [place]    [place]    [place]    [place]      Pediatrician Fax Number:    Risk Factors/Current Problems:  Substance Use (per H&P, MOB reported THC use before confirming preg.)   Cognitive State:  Alert , Able to Concentrate , Insightful , Goal Oriented    Mood/Affect:  Calm , Comfortable , Tearful    CSW Assessment: CSW consulted as MOB has a  history of anxiety/depression/ and Bipolar. CSW went to speak with MOB at bedside to discuss further needs.   Upon entering the room CSW observed that MOB was lying in bed while holding infant. CSW congratulated MOB on the birth of infant as well as explained to MOB reason for visit.   MOB began to speak with CSW. CSW was advised that MOB has had depression, anxiety, and bipolar since she she can remember. MOB expressed that she has been on medications since she was 28 years old. MOB expressed that she is on zoloft for depression (stable per MOB's report)  but nothing for her anxiety or bipolar. MOB expressed that to cope she has just been figuring out things that tend to help her. MOB expressed that she has an older son Taylor Buck age 6 who lives in the home with her and her sister. MOB expressed that she has support from her sister, however sister is dealing with  Personal medical issues at this time. CSW sought further details on MOB's other mental health needs as well as Substance use history.   CSW was advised that per MOB, MOB used THC before confirming pregnancy. CSW advised MOB   that per H&P records it showed that MOB did use THC but stopped once pregnancy was confirmed. CSW advised MOB that usually infants are drug screened to ensure that substances are not in infants system. CSW advised MOB that per chart, no UDS or CDS had been placed for infant. CSW advised MOB that CSW would ask MD about this. MOB began to get upset with CSW informing her of this. MOB expressed "why would you do that? 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 CSW offered support to MOB and explained to MOB that hospital polcies require that infants be drug screen if MOB uses any substances during pregnancy. CSW advised MOB that CSW would confirm with MD the status of drug screening infant.  Upon this happening, MD walked into room to examine infant.  While MD examined infant MOB went to the restroom. CSW explained concerns to MD regarding THC use early on in pregnancy. CSW was advised that no UDS or CDS was ordered therefore infant would not be being screened. CSW expressed understanding a swell as updated MOB to ease concerns of hers. CSW was advised that MOB plans to take infant to [facility] on [address] for Pediatrician care. MOB expressed that she has all needed items to care for infant and that infant will sleep in basinet in her room. MOB expressed that she has a carseat.   CSW reviewed with MOB SIDS as well as provided PPD education to MOB. CSW discussed signs that MOB should look for as well as provided her with Postpartum Checklist to keep track of feelings. At the end of the conversation MOB expressed to CSW that she understands why CSW informed of the drug screen policy and that she was just upset because she has truly been working hard to stay sober. CSW offered support to MOB and expressed that CSW understood the feelings of MOB especially with 3 years of sobriety. CSW updated MOB that per MD, no UDS or CDS is being collected or monitoring at this time.   CSW Plan/Description:  No Further Intervention Required/No Barriers to Discharge, Sudden Infant Death Syndrome (SIDS) Education, Hospital Drug Screen Policy Information, Other Patient/Family Education    Orissa Arreaga S Shalicia Craghead, LCSWA 05/28/2018, 1:37 PM  
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
## 2018-06-09 ENCOUNTER — Ambulatory Visit: Payer: Medicaid Other | Admitting: Obstetrics and Gynecology

## 2018-06-19 ENCOUNTER — Ambulatory Visit (INDEPENDENT_AMBULATORY_CARE_PROVIDER_SITE_OTHER): Payer: Medicaid Other | Admitting: Obstetrics & Gynecology

## 2018-06-19 ENCOUNTER — Other Ambulatory Visit: Payer: Self-pay

## 2018-06-19 MED ORDER — PRENATAL VITAMINS 28-0.8 MG PO TABS: 1.0000 | ORAL_TABLET | Freq: Every morning | ORAL | 5 refills | Status: DC

## 2018-06-19 NOTE — Patient Instructions (Signed)

## 2018-06-19 NOTE — Progress Notes (Signed)
TELEHEALTH POSTPARTUM VIRTUAL VIDEO VISIT ENCOUNTER NOTE  [facility]   I connected with Taylor Buck on 06/19/18 at  8:15 AM EDT by WebEx Video Encounter at home and verified that I am speaking with the correct person using two identifiers.    I discussed the limitations, risks, security and privacy concerns of performing an evaluation and management service by telephone and the availability of in person appointments. I also discussed with the patient that there may be a patient responsible charge related to this service. The patient expressed understanding and agreed to proceed.  Appointment Date: 06/19/2018  [facility]N Clinic: [facility]  Chief Complaint: Postpartum Visit  History of Present Illness: Taylor Buck is a 28 y.o. Caucasian [id number] being evaluated for postpartum followup.    She is s/p normal spontaneous vaginal delivery on 05/27/18 at 39 weeks; she was discharged to home on 05/28/18. Pregnancy complicated by h/o substance use, bipolar. Baby is doing well.  Complains of difficulty voiding on occasion  Vaginal bleeding or discharge: No  Intercourse: No  Contraception: IUD Mode of feeding infant: Breast PP depression s/s: Yes . Score: 3 Any bowel or bladder issues: Yes  Pap smear: no abnormalities (date: 11/06/17)  Review of Systems: Positive for voiding problem. Her 12 point review of systems is negative or as noted in the History of Present Illness.  Patient Active Problem List   Diagnosis Date Noted  . H/O: substance abuse (HCC) 03/05/2018  . Bipolar 1 disorder (HCC) 12/10/2017  . History of depression 12/10/2017    Medications Taylor NearingKayla D. Buck had no medications administered during this visit. Current Outpatient Medications  Medication Sig Dispense Refill  . sertraline (ZOLOFT) 25 MG tablet Take 1 tablet (25 mg total) by mouth daily. Take one 25 mg tab in addition to the 100  mg tab daily, to make 125 mg daily. (Patient taking differently: Take 125 mg by mouth daily. Take one 25 mg tab in addition to the 100 mg tab daily, to make 125 mg daily.) 30 tablet 2  . acetaminophen (TYLENOL) 325 MG tablet Take 650 mg by mouth daily as needed for pain.    . calcium carbonate (TUMS EX) 750 MG chewable tablet Chew 1 tablet by mouth at bedtime as needed (for nausea).    Marland Kitchen. ibuprofen (ADVIL) 600 MG tablet Take 1 tablet (600 mg total) by mouth every 6 (six) hours. (Patient not taking: Reported on 06/19/2018) 30 tablet 0  . omeprazole (PRILOSEC) 20 MG capsule Take 1 capsule (20 mg total) by mouth daily. (Patient not taking: Reported on 06/19/2018) 60 capsule 5  . Prenatal Vit-Fe Fumarate-FA (PRENATAL VITAMINS) 28-0.8 MG TABS Take 1 tablet by mouth every morning. 30 tablet 5   No current facility-administered medications for this visit.     Allergies Milk-related compounds; Latex; Penicillins; Phenergan [promethazine hcl]; and Reglan [metoclopramide]  Physical Exam:  There were no vitals taken for this visit.  General:  Alert, oriented and cooperative. Patient is in no acute distress.  Mental Status: Normal mood and affect. Normal behavior. Normal judgment and thought content.   Respiratory: Normal respiratory effort noted, no problems with respiration noted  Rest of physical exam deferred due to type of encounter  PP Depression Screening:   Edinburgh Postnatal Depression Scale - 06/19/18 0817      Edinburgh Postnatal Depression Scale:  In the Past 7 Days   I have been able to laugh and see the funny side of  things.  0    I have looked forward with enjoyment to things.  0    I have blamed myself unnecessarily when things went wrong.  0    I have been anxious or worried for no good reason.  0    I have felt scared or panicky for no good reason.  1    Things have been getting on top of me.  1    I have been so unhappy that I have had difficulty sleeping.  0    I have felt sad or  miserable.  0    I have been so unhappy that I have been crying.  1    The thought of harming myself has occurred to me.  0    Edinburgh Postnatal Depression Scale Total  3       Assessment:Patient is a 28 y.o. [id number] who is 3.5 weeks postpartum from a normal spontaneous vaginal delivery.  She is doing well.  There are no diagnoses linked to this encounter.  RTC IUD in 6 days  I discussed the assessment and treatment plan with the patient. The patient was provided an opportunity to ask questions and all were answered. The patient agreed with the plan and demonstrated an understanding of the instructions.   The patient was advised to call back or seek an in-person evaluation/go to the ED for any concerning postpartum symptoms.  I provided 10 minutes of face-to-face time during this encounter.   Scheryl Darter, MD Center for [facility]

## 2018-06-25 ENCOUNTER — Ambulatory Visit: Payer: Medicaid Other | Admitting: Certified Nurse Midwife

## 2018-06-30 ENCOUNTER — Encounter: Payer: Self-pay | Admitting: Nurse Practitioner

## 2018-06-30 ENCOUNTER — Ambulatory Visit (INDEPENDENT_AMBULATORY_CARE_PROVIDER_SITE_OTHER): Payer: Medicaid Other | Admitting: Nurse Practitioner

## 2018-06-30 ENCOUNTER — Other Ambulatory Visit: Payer: Self-pay

## 2018-06-30 ENCOUNTER — Telehealth: Payer: Self-pay | Admitting: Nurse Practitioner

## 2018-06-30 VITALS — BP 111/73 | HR 76 | Wt 197.4 lb

## 2018-06-30 DIAGNOSIS — Z3202 Encounter for pregnancy test, result negative: Secondary | ICD-10-CM | POA: Diagnosis not present

## 2018-06-30 DIAGNOSIS — Z30430 Encounter for insertion of intrauterine contraceptive device: Secondary | ICD-10-CM | POA: Diagnosis not present

## 2018-06-30 DIAGNOSIS — F319 Bipolar disorder, unspecified: Secondary | ICD-10-CM

## 2018-06-30 LAB — POCT URINE PREGNANCY: Preg Test, Ur: NEGATIVE

## 2018-06-30 MED ORDER — LEVONORGESTREL 20 MCG/24HR IU IUD
INTRAUTERINE_SYSTEM | Freq: Once | INTRAUTERINE | Status: AC
  Administered 2018-06-30: 11:00:00 via INTRAUTERINE
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
## 2018-06-30 NOTE — Patient Instructions (Signed)
Intrauterine Device Insertion, Care After    This sheet gives you information about how to care for yourself after your procedure. Your health care provider may also give you more specific instructions. If you have problems or questions, contact your health care provider.  What can I expect after the procedure?  After the procedure, it is common to have:   Cramps and pain in the abdomen.   Light bleeding (spotting) or heavier bleeding that is like your menstrual period. This may last for up to a few days.   Lower back pain.   Dizziness.   Headaches.   Nausea.  Follow these instructions at home:   Before resuming sexual activity, check to make sure that you can feel the IUD string(s). You should be able to feel the end of the string(s) below the opening of your cervix. If your IUD string is in place, you may resume sexual activity.  ? If you had a hormonal IUD inserted more than 7 days after your most recent period started, you will need to use a backup method of birth control for 7 days after IUD insertion. Ask your health care provider whether this applies to you.   Continue to check that the IUD is still in place by feeling for the string(s) after every menstrual period, or once a month.   Take over-the-counter and prescription medicines only as told by your health care provider.   Do not drive or use heavy machinery while taking prescription pain medicine.   Keep all follow-up visits as told by your health care provider. This is important.  Contact a health care provider if:   You have bleeding that is heavier or lasts longer than a normal menstrual cycle.   You have a fever.   You have cramps or abdominal pain that get worse or do not get better with medicine.   You develop abdominal pain that is new or is not in the same area of earlier cramping and pain.   You feel lightheaded or weak.   You have abnormal or bad-smelling discharge from your vagina.   You have pain during sexual  activity.   You have any of the following problems with your IUD string(s):  ? The string bothers or hurts you or your sexual partner.  ? You cannot feel the string.  ? The string has gotten longer.   You can feel the IUD in your vagina.   You think you may be pregnant, or you miss your menstrual period.   You think you may have an STI (sexually transmitted infection).  Get help right away if:   You have flu-like symptoms.   You have a fever and chills.   You can feel that your IUD has slipped out of place.  Summary   After the procedure, it is common to have cramps and pain in the abdomen. It is also common to have light bleeding (spotting) or heavier bleeding that is like your menstrual period.   Continue to check that the IUD is still in place by feeling for the string(s) after every menstrual period, or once a month.   Keep all follow-up visits as told by your health care provider. This is important.   Contact your health care provider if you have problems with your IUD string(s), such as the string getting longer or bothering you or your sexual partner.  This information is not intended to replace advice given to you by your health care provider. Make   sure you discuss any questions you have with your health care provider.  Document Released: 09/12/2010 Document Revised: 12/06/2015 Document Reviewed: 12/06/2015  Elsevier Interactive Patient Education  2019 Else[employer] Inc[employer]  Levonorgestrel intrauterine device (IUD)  What is this medicine?  LEVONORGESTREL IUD (LEE voe nor jes trel) is a contraceptive (birth control) device. The device is placed inside the uterus by a healthcare professional. It is used to prevent pregnancy. This device can also be used to treat heavy bleeding that occurs during your period.  This medicine may be used for other purposes; ask your health care provider or pharmacist if you have questions.  COMMON BRAND NAME(S): Kyleena, LILETTA, Mirena, Skyla  What should I tell my health  care provider before I take this medicine?  They need to know if you have any of these conditions:  -abnormal Pap smear  -cancer of the breast, uterus, or cervix  -diabetes  -endometritis  -genital or pelvic infection now or in the past  -have more than one sexual partner or your partner has more than one partner  -heart disease  -history of an ectopic or tubal pregnancy  -immune system problems  -IUD in place  -liver disease or tumor  -problems with blood clots or take blood-thinners  -seizures  -use intravenous drugs  -uterus of unusual shape  -vaginal bleeding that has not been explained  -an unusual or allergic reaction to levonorgestrel, other hormones, silicone, or polyethylene, medicines, foods, dyes, or preservatives  -pregnant or trying to get pregnant  -breast-feeding  How should I use this medicine?  This device is placed inside the uterus by a health care professional.  Talk to your pediatrician regarding the use of this medicine in children. Special care may be needed.  Overdosage: If you think you have taken too much of this medicine contact a poison control center or emergency room at once.  NOTE: This medicine is only for you. Do not share this medicine with others.  What if I miss a dose?  This does not apply. Depending on the brand of device you have inserted, the device will need to be replaced every 3 to 5 years if you wish to continue using this type of birth control.  What may interact with this medicine?  Do not take this medicine with any of the following medications:  -amprenavir  -bosentan  -fosamprenavir  This medicine may also interact with the following medications:  -aprepitant  -armodafinil  -barbiturate medicines for inducing sleep or treating seizures  -bexarotene  -boceprevir  -griseofulvin  -medicines to treat seizures like carbamazepine, ethotoin, felbamate, oxcarbazepine, phenytoin, topiramate  -modafinil  -pioglitazone  -rifabutin  -rifampin  -rifapentine  -some medicines to  treat HIV infection like atazanavir, efavirenz, indinavir, lopinavir, nelfinavir, tipranavir, ritonavir  -St. John's wort  -warfarin  This list may not describe all possible interactions. Give your health care provider a list of all the medicines, herbs, non-prescription drugs, or dietary supplements you use. Also tell them if you smoke, drink alcohol, or use illegal drugs. Some items may interact with your medicine.  What should I watch for while using this medicine?  Visit your doctor or health care professional for regular check ups. See your doctor if you or your partner has sexual contact with others, becomes HIV positive, or gets a sexual transmitted disease.  This product does not protect you against HIV infection (AIDS) or other sexually transmitted diseases.  You can check the placement of the IUD yourself by reaching up to   the top of your vagina with clean fingers to feel the threads. Do not pull on the threads. It is a good habit to check placement after each menstrual period. Call your doctor right away if you feel more of the IUD than just the threads or if you cannot feel the threads at all.  The IUD may come out by itself. You may become pregnant if the device comes out. If you notice that the IUD has come out use a backup birth control method like condoms and call your health care provider.  Using tampons will not change the position of the IUD and are okay to use during your period.  This IUD can be safely scanned with magnetic resonance imaging (MRI) only under specific conditions. Before you have an MRI, tell your healthcare provider that you have an IUD in place, and which type of IUD you have in place.  What side effects may I notice from receiving this medicine?  Side effects that you should report to your doctor or health care professional as soon as possible:  -allergic reactions like skin rash, itching or hives, swelling of the face, lips, or tongue  -fever, flu-like symptoms  -genital  sores  -high blood pressure  -no menstrual period for 6 weeks during use  -pain, swelling, warmth in the leg  -pelvic pain or tenderness  -severe or sudden headache  -signs of pregnancy  -stomach cramping  -sudden shortness of breath  -trouble with balance, talking, or walking  -unusual vaginal bleeding, discharge  -yellowing of the eyes or skin  Side effects that usually do not require medical attention (report to your doctor or health care professional if they continue or are bothersome):  -acne  -breast pain  -change in sex drive or performance  -changes in weight  -cramping, dizziness, or faintness while the device is being inserted  -headache  -irregular menstrual bleeding within first 3 to 6 months of use  -nausea  This list may not describe all possible side effects. Call your doctor for medical advice about side effects. You may report side effects to FDA at 1-800-FDA-1088.  Where should I keep my medicine?  This does not apply.  NOTE: This sheet is a summary. It may not cover all possible information. If you have questions about this medicine, talk to your doctor, pharmacist, or health care provider.   2019 Elsevier/Gold Standard (2015-10-27 14:14:56)

## 2018-06-30 NOTE — Progress Notes (Signed)
Pt is here for IUD insertion. PP 30month. Pt reports anxiousness in the last month. Is currently taking 125mg  zoloft daily. EPDS: score 12.

## 2018-06-30 NOTE — Telephone Encounter (Signed)
Appointment scheduled. She stated she does have [employer] app.

## 2018-07-01 ENCOUNTER — Ambulatory Visit (INDEPENDENT_AMBULATORY_CARE_PROVIDER_SITE_OTHER): Payer: Medicaid Other | Admitting: Clinical

## 2018-07-01 DIAGNOSIS — F319 Bipolar disorder, unspecified: Secondary | ICD-10-CM

## 2018-07-01 DIAGNOSIS — Z658 Other specified problems related to psychosocial circumstances: Secondary | ICD-10-CM | POA: Diagnosis not present

## 2018-07-01 NOTE — Addendum Note (Signed)
Addended by: Hulda Marin C on: 07/01/2018 10:57 AM   Modules accepted: Orders

## 2018-07-01 NOTE — BH Specialist Note (Signed)
Integrated Behavioral Health Follow Up Visit  MRN: [id number] Name: Taylor Buck  Number of Integrated Behavioral Health Clinician visits: 2/6 Session Start time: 9:15am  Session End time: 9:48am Total time: 30 minutes  Type of Service: Integrated Behavioral Health- Individual/Family Interpretor:No. Interpretor Name and Language: n/a  SUBJECTIVE: Taylor Buck is a 28 y.o. female accompanied by n/a Patient was referred by Catalina Antigua, MD for depression at previous visit. Patient reports the following symptoms/concerns: Pt states her primary concern today is feeling overwhelmed, irritable, anxious, frustrated at "doing this on my own, with no partner", lack of appetite (but eats because she knows it's necessary) and little motivation. Pt is sleeping when baby sleeps, as much as able.  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  Reduce symptoms of: anxiety, depression, mood instability and stress  2.  Increase knowledge and/or ability of: coping skills, healthy habits and stress reduction  3.  Demonstrate ability to: Increase healthy adjustment to current life circumstances, Increase adequate support systems for patient/family and Increase motivation to adhere to plan of care  INTERVENTIONS: Interventions utilized:  Behavioral Activation and Link to [facility] Standardized Assessments completed: Not Needed  ASSESSMENT: Patient currently  experiencing Bipolar affective disorder and Psychosocial stress  Patient may benefit from continued brief therapeutic interventions regarding symptoms and referral to psychiatry for ongoing [facility] medication management.  PLAN: 1. Follow up with behavioral health clinician on : One week to continue monitoring symptoms  2. Behavioral recommendations:  -Call child's school today: When is the last day to turn in homework? (Stop working with children on homework after this time. Let it go.) -On 07/02/2018, Call [facility] [place], to set up appointment for initial assessment. (Will be scheduled with psychiatry after initial assessment). -On 07/03/2018, Look up both [email or web address] and [email or web address]. Decide which free online new mom support group is a better fit, and register for at least one.  -Continue sleeping when baby sleeps, eating routine meals, and taking all medication as prescribed 3. Referral(s): [facility] (In Clinic) and [facility] (LME/Outside Clinic) 4. "From scale of 1-10, how likely are you to follow plan?": 10  Fahed Morten C [place], LCSW

## 2018-07-08 ENCOUNTER — Ambulatory Visit (INDEPENDENT_AMBULATORY_CARE_PROVIDER_SITE_OTHER): Payer: Medicaid Other | Admitting: Clinical

## 2018-07-08 ENCOUNTER — Other Ambulatory Visit: Payer: Self-pay | Admitting: Obstetrics and Gynecology

## 2018-07-08 ENCOUNTER — Other Ambulatory Visit: Payer: Self-pay

## 2018-07-08 DIAGNOSIS — F319 Bipolar disorder, unspecified: Secondary | ICD-10-CM

## 2018-07-08 NOTE — BH Specialist Note (Signed)
Integrated Behavioral Health Visit via Telemedicine (Telephone)  07/08/2018 Taylor Buck [id number]   Session Start time: 9:05  Session End time: 9:19 Total time: 15 minutes  Referring Provider: Mora Bellman, MD Type of Visit: Telephonic Patient location: Home [facility] Provider location: [facility] All persons participating in visit: Patient Taylor Buck and [place]  Confirmed patient's address: Yes  Confirmed patient's phone number: Yes  Any changes to demographics: No   Confirmed patient's insurance: Yes  Any changes to patient's insurance: No   Discussed confidentiality: Discussed at previous visit   The following statements were read to the patient and/or legal guardian that are established with the [facility] Provider.  "The purpose of this phone visit is to provide behavioral health care while limiting exposure to the coronavirus (COVID19).  There is a possibility of technology failure and discussed alternative modes of communication if that failure occurs."  "By engaging in this telephone visit, you consent to the provision of healthcare.  Additionally, you authorize for your insurance to be billed for the services provided during this telephone visit."   Patient and/or legal guardian consented to telephone visit: Yes   PRESENTING CONCERNS: Patient and/or family reports the following symptoms/concerns: Pt states her primary concern today is worry over emerging manic episode coming soon, with 2-3 hours of sleep per night the last 3 nights, no interest in eating, feeling "impending doom", as experienced with previous manic episodes. Pt feels her depressive symptoms are coming down; no SI, no HI. She feels her panic attacks are decreasing the past week (to only 3 as opposed to almost daily), that she attributes to reduced stress with  school over for the year.  Duration of problem: Ongoing, increase in past 3 days; Severity of problem:  moderately severe  STRENGTHS (Protective Factors/Coping Skills): Awareness of symptoms; open to medication/treatment  GOALS ADDRESSED: Patient will: 1.  Reduce symptoms of: mood instability   INTERVENTIONS: Interventions utilized:  Medication Monitoring Standardized Assessments completed: GAD-7 and PHQ 9  ASSESSMENT: Patient currently experiencing Bipolar 1 disorder.   Patient may benefit from brief therapeutic intervention today, and referral to community mental health follow up.   PLAN: 1. Follow up with behavioral health clinician on : 5 days  2. Behavioral recommendations:  -Continue taking BH medication as prescribed -Call [facility] again today, use option 5 to schedule initial appointment -Contact Crisis Management if mania begins prior to being seen by psychiatry 3. Referral(s): [place] (In Clinic) and [place] (LME/Outside Clinic)  [place]

## 2018-07-15 ENCOUNTER — Ambulatory Visit (HOSPITAL_COMMUNITY): Payer: Medicaid Other | Admitting: Licensed Clinical Social Worker

## 2018-07-15 DIAGNOSIS — F319 Bipolar disorder, unspecified: Secondary | ICD-10-CM

## 2018-07-15 DIAGNOSIS — F4312 Post-traumatic stress disorder, chronic: Secondary | ICD-10-CM
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
## 2018-07-16 ENCOUNTER — Ambulatory Visit: Payer: Medicaid Other | Admitting: Clinical

## 2018-07-16 ENCOUNTER — Other Ambulatory Visit: Payer: Self-pay

## 2018-07-16 DIAGNOSIS — F319 Bipolar disorder, unspecified: Secondary | ICD-10-CM

## 2018-07-16 NOTE — BH Specialist Note (Signed)
Integrated Behavioral Health Visit via Telemedicine (Telephone)  07/16/2018 Taylor Buck [id number]   Session Start time: 11:32  Session End time: 11:35 Total time: 3 minutes  Referring Provider: Mora Bellman, MD Type of Visit: Telephonic Taylor location: Home [facility] Provider location: [facility] All persons participating in visit: Taylor Buck and [place]  Confirmed Taylor's address: Yes  Confirmed Taylor's phone number: Yes  Any changes to demographics: No   Confirmed Taylor's insurance: Yes  Any changes to Taylor's insurance: No   Discussed confidentiality: Previous visit   The following statements were read to the Taylor and/or legal guardian that are established with the [facility] Provider.  "The purpose of this phone visit is to provide behavioral health care while limiting exposure to the coronavirus (COVID19).  There is a possibility of technology failure and discussed alternative modes of communication if that failure occurs."  "By engaging in this telephone visit, you consent to the provision of healthcare.  Additionally, you authorize for your insurance to be billed for the services provided during this telephone visit."   Taylor and/or legal guardian consented to telephone visit: Yes   PRESENTING CONCERNS: Taylor and/or family reports the following symptoms/concerns: Pt states she attended first session with therapist for assessment at [place], and will see psychiatrist as soon as she completes paperwork. Pt says she is currently in "my manic state, with impulsivity, anxiety and racing thoughts, but I still feel in control", and "feels good knowing I'm getting treatment".  Duration of problem: Ongoing; Severity of problem: moderately severe  STRENGTHS (Protective Factors/Coping Skills): Self-awareness; open to treatment  GOALS ADDRESSED: Taylor will: 1.  Reduce symptoms of:  mood instability  INTERVENTIONS: Interventions utilized:  Supportive Counseling Standardized Assessments completed: Not Needed  ASSESSMENT: Taylor currently experiencing Bipolar 1 disorder.   Taylor may benefit from continued treatment via therapy and psychiatry.  PLAN: 1. Follow up with behavioral health clinician on : As needed 2. Behavioral recommendations:  -Continue attending therapy and psychiatry appointments at [facility] 3. Referral(s): [place] (In Clinic)  [place]

## 2018-07-20 ENCOUNTER — Ambulatory Visit (HOSPITAL_COMMUNITY): Payer: Medicaid Other | Admitting: Psychiatry

## 2018-07-20 ENCOUNTER — Other Ambulatory Visit: Payer: Self-pay

## 2018-07-24 ENCOUNTER — Ambulatory Visit (HOSPITAL_COMMUNITY): Payer: Medicaid Other | Admitting: Licensed Clinical Social Worker

## 2018-07-28 ENCOUNTER — Ambulatory Visit: Payer: Medicaid Other | Admitting: Nurse Practitioner

## 2018-09-11 ENCOUNTER — Ambulatory Visit: Payer: Medicaid Other | Admitting: Nurse Practitioner

## 2018-09-28 ENCOUNTER — Ambulatory Visit: Payer: Medicaid Other | Admitting: Advanced Practice Midwife

## 2018-10-01 ENCOUNTER — Ambulatory Visit: Payer: Medicaid Other | Admitting: Certified Nurse Midwife

## 2018-11-03 ENCOUNTER — Telehealth: Payer: Self-pay | Admitting: *Deleted

## 2018-11-03 NOTE — Telephone Encounter (Signed)
Pt called to office stating she is having problems that she thinks is related to her IUD. Pt states she is having severe back pain that is now interrupting her work, prolong bleeding(2 weeks), cramping and pain with intercourse.  Pt would like to know what is recommended.   Attempt to return call.  LM on VM per pt instruction advising her to contact office for a IUD/problem visit for evaluation of symptoms.

## 2019-06-21 ENCOUNTER — Emergency Department
Admission: EM | Admit: 2019-06-21 | Discharge: 2019-06-21 | Disposition: A | Discharge status: Home / Self Care | Payer: Self-pay | Attending: Emergency Medicine | Admitting: Emergency Medicine

## 2019-06-21 ENCOUNTER — Emergency Department
Admission: EM | Admit: 2019-06-21 | Discharge: 2019-06-21 | Disposition: A | Discharge status: Other Acute Inpatient Hospital | Payer: Self-pay | Attending: Physician Assistant | Admitting: Physician Assistant

## 2019-06-21 ENCOUNTER — Emergency Department: Payer: Self-pay

## 2019-06-21 DIAGNOSIS — M545 Low back pain, unspecified: Secondary | ICD-10-CM

## 2019-06-21 DIAGNOSIS — M5410 Radiculopathy, site unspecified: Secondary | ICD-10-CM

## 2019-06-21 DIAGNOSIS — R202 Paresthesia of skin: Secondary | ICD-10-CM | POA: Insufficient documentation

## 2019-06-21 DIAGNOSIS — M5416 Radiculopathy, lumbar region: Secondary | ICD-10-CM | POA: Insufficient documentation

## 2019-06-21 HISTORY — DX: Gastro-esophageal reflux disease without esophagitis: K21.9

## 2019-06-21 HISTORY — DX: Depression, unspecified: F32.A

## 2019-06-21 LAB — BASIC METABOLIC PANEL
Anion Gap: 11.9 mMol/L (ref 7.0–18.0)
BUN / Creatinine Ratio: 17.6 Ratio (ref 10.0–30.0)
BUN: 13 mg/dL (ref 7–22)
CO2: 23 mMol/L (ref 20–30)
Calcium: 9.2 mg/dL (ref 8.5–10.5)
Chloride: 111 mMol/L — ABNORMAL HIGH (ref 98–110)
Creatinine: 0.74 mg/dL (ref 0.60–1.20)
EGFR: 110 mL/min/{1.73_m2} (ref 60–150)
Glucose: 89 mg/dL (ref 71–99)
Osmolality Calculated: 283 mOsm/kg (ref 275–300)
Potassium: 3.9 mMol/L (ref 3.5–5.3)
Sodium: 142 mMol/L (ref 136–147)

## 2019-06-21 LAB — VH URINALYSIS WITH MICROSCOPIC
Bilirubin, UA: NEGATIVE mg/dL
Glucose, UA: NEGATIVE mg/dL
Ketones UA: NEGATIVE mg/dL
Leukocyte Esterase, UA: NEGATIVE Leu/uL
Nitrite, UA: NEGATIVE
Protein, UR: NEGATIVE mg/dL
Urine Specific Gravity: 1.025 (ref 1.001–1.040)
Urobilinogen, UA: 0.2 mg/dL
pH, Urine: 7 pH (ref 5.0–8.0)

## 2019-06-21 LAB — CBC AND DIFFERENTIAL
Basophils %: 1.2 % (ref 0.0–3.0)
Basophils Absolute: 0.1 10*3/uL (ref 0.0–0.3)
Eosinophils %: 3.7 % (ref 0.0–7.0)
Eosinophils Absolute: 0.3 10*3/uL (ref 0.0–0.8)
Hematocrit: 41.7 % (ref 36.0–48.0)
Hemoglobin: 14.4 gm/dL (ref 12.0–16.0)
Lymphocytes Absolute: 3 10*3/uL (ref 0.6–5.1)
Lymphocytes: 33.4 % (ref 15.0–46.0)
MCH: 30 pg (ref 28–35)
MCHC: 34 gm/dL (ref 32–36)
MCV: 87 fL (ref 80–100)
MPV: 8.4 fL (ref 6.0–10.0)
Monocytes Absolute: 0.6 10*3/uL (ref 0.1–1.7)
Monocytes: 6.2 % (ref 3.0–15.0)
Neutrophils %: 55.6 % (ref 42.0–78.0)
Neutrophils Absolute: 5 10*3/uL (ref 1.7–8.6)
PLT CT: 271 10*3/uL (ref 130–440)
RBC: 4.78 10*6/uL (ref 3.80–5.00)
RDW: 13.2 % (ref 11.0–14.0)
WBC: 9 10*3/uL (ref 4.0–11.0)

## 2019-06-21 LAB — HCG, SERUM, QUALITATIVE: BHCG Qualitative: NEGATIVE

## 2019-06-21 LAB — URINE HCG QUALITATIVE: Urine HCG Qualitative: NEGATIVE

## 2019-06-21 MED ORDER — CYCLOBENZAPRINE HCL 10 MG PO TABS
10.0000 mg | ORAL_TABLET | Freq: Three times a day (TID) | ORAL | 0 refills | Status: DC | PRN
Start: 2019-06-21 — End: 2019-07-08

## 2019-06-21 MED ORDER — KETOROLAC TROMETHAMINE 10 MG PO TABS
10.0000 mg | ORAL_TABLET | Freq: Three times a day (TID) | ORAL | 0 refills | Status: DC | PRN
Start: 2019-06-21 — End: 2019-07-08

## 2019-06-21 MED ORDER — HYDROCODONE-ACETAMINOPHEN 5-325 MG PO TABS
1.00 | ORAL_TABLET | Freq: Once | ORAL | Status: AC
Start: 2019-06-21 — End: 2019-06-21
  Administered 2019-06-21: 14:00:00 1 via ORAL

## 2019-06-21 MED ORDER — LORAZEPAM 2 MG/ML IJ SOLN
INTRAMUSCULAR | Status: AC
Start: 2019-06-21 — End: ?
  Filled 2019-06-21: qty 1

## 2019-06-21 MED ORDER — LORAZEPAM 2 MG/ML IJ SOLN
1.00 mg | Freq: Once | INTRAMUSCULAR | Status: AC
Start: 2019-06-21 — End: 2019-06-21
  Administered 2019-06-21: 17:00:00 1 mg via INTRAVENOUS

## 2019-06-21 MED ORDER — HYDROCODONE-ACETAMINOPHEN 5-325 MG PO TABS
ORAL_TABLET | ORAL | Status: AC
Start: 2019-06-21 — End: ?
  Filled 2019-06-21: qty 1

## 2019-06-21 MED ORDER — SODIUM CHLORIDE 0.9 % IV BOLUS
1000.00 mL | Freq: Once | INTRAVENOUS | Status: AC
Start: 2019-06-21 — End: 2019-06-21
  Administered 2019-06-21: 17:00:00 1000 mL via INTRAVENOUS

## 2019-06-21 MED ORDER — KETOROLAC TROMETHAMINE 15 MG/ML IJ SOLN
15.00 mg | Freq: Once | INTRAMUSCULAR | Status: AC
Start: 2019-06-21 — End: 2019-06-21
  Administered 2019-06-21: 17:00:00 15 mg via INTRAVENOUS

## 2019-06-21 MED ORDER — GADOTERATE MEGLUMINE 10 MMOL/20ML IV SOLN
20.00 mL | Freq: Once | INTRAVENOUS | Status: AC | PRN
Start: 2019-06-21 — End: 2019-06-21
  Administered 2019-06-21: 19:00:00 10 mmol via INTRAVENOUS

## 2019-06-21 MED ORDER — KETOROLAC TROMETHAMINE 15 MG/ML IJ SOLN
INTRAMUSCULAR | Status: AC
Start: 2019-06-21 — End: ?
  Filled 2019-06-21: qty 1
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
## 2019-06-21 NOTE — ED Provider Notes (Addendum)
EMERGENCY DEPARTMENT  History and Physical Exam       Patient Name: Taylor Wells, Taylor Wells  Encounter Date:  06/21/2019  Attending Physician: Jane Canary, D.O.  PCP: Marisa Sprinkles, MD  Patient DOB:  08-30-1990  MRN:  [id number]  Room:  N1/N1-A      History of Presenting Illness     Chief complaint: Back Pain    HPI/ROS is limited by: none  HPI/ROS given by: patient    Taylor Wells is a 29 y.o. female who presents to the ED with complaint of low back pain with radiation to the left hip since Wednesday.  She went to an outside emergency department and they referred her up here for possible MRI.  Patient states that she is having some numbness in the perineal area.  She denies any radiation of the pain down her buttocks or down her leg.  She denies any weakness she denies any loss of bowel or bladder control but she has not had a bowel movement in several days.  Patient denies fevers chills or sweats.  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  She denies ever using any IV drugs.  She denies any recent urinary bladder procedures or colonoscopies.  There is been no fevers chills or sweats.         Review of Systems   Review of Systems   Constitutional: Negative for activity change, chills and fever.   HENT: Negative for congestion, ear pain, sinus pain, sore throat and trouble swallowing.    Eyes: Negative for redness.   Respiratory: Negative for cough, chest tightness, shortness of breath and wheezing.    Cardiovascular: Negative for chest pain and leg swelling.   Gastrointestinal: Negative for abdominal pain, constipation and diarrhea.   Genitourinary: Negative for difficulty urinating and urgency.   Musculoskeletal: Negative for neck stiffness.   Skin: Negative for rash.   Neurological: Negative for weakness and numbness.   All other systems reviewed and are negative.       Other pertinent review of systems findings in history of present  illness.  Allergies & Medications     Pt is allergic to penicillins.    Current/Home Medications    CALCIUM CARBONATE (TUMS) 500 MG CHEWABLE TABLET    Chew 1 tablet by mouth daily    MULTIPLE VITAMINS-MINERALS (MULTIVITAMIN WITH MINERALS) TABLET    Take 1 tablet by mouth daily        Past Medical History     Pt   Past Medical History:   Diagnosis Date    Depression     Gastroesophageal reflux disease         Past Surgical History     Pt   Past Surgical History:   Procedure Laterality Date    CHOLECYSTECTOMY      HERNIA REPAIR          Family History     The family history includes Arthritis in her sister.     Social History     Pt reports that she has been smoking cigarettes. She has a 6.50 pack-year smoking history. She has never used smokeless tobacco. She reports previous alcohol use. She reports current drug use.     Physical Exam     Blood pressure 113/82, pulse 67, temperature 97.6 F (36.4 C), resp. rate 17, height 1.702 m, weight 98 kg, last menstrual period 06/21/2019, SpO2 99 %.    Vitals  signs reviewed.    Physical Exam  Vitals and nursing note reviewed.   Constitutional:       General: She is not in acute distress.     Appearance: She is well-developed. She is not diaphoretic.   HENT:      Head: Normocephalic and atraumatic.      Nose: Nose normal.   Eyes:      Conjunctiva/sclera: Conjunctivae normal.   Neck:      Vascular: No JVD.      Trachea: No tracheal deviation.   Cardiovascular:      Rate and Rhythm: Normal rate and regular rhythm.   Pulmonary:      Effort: Pulmonary effort is normal. No respiratory distress.      Breath sounds: Normal breath sounds.   Abdominal:      General: There is no distension.      Palpations: Abdomen is soft.      Tenderness: There is no abdominal tenderness. There is no guarding or rebound.   Musculoskeletal:         General: Normal range of motion.      Cervical back: Normal range of motion and neck supple.   Lymphadenopathy:      Cervical: No cervical adenopathy.    Skin:     General: Skin is warm and dry.      Capillary Refill: Capillary refill takes less than 2 seconds.      Findings: No rash.   Neurological:      General: No focal deficit present.      Mental Status: She is alert and oriented to person, place, and time.      Cranial Nerves: No cranial nerve deficit.      Sensory: No sensory deficit.      Coordination: Coordination normal.      Comments:   MOTOR:            Muscle                         Rt.        Lt.  Shoulder shrug           5          5  Deltoid                         5          5  Biceps                         5          5  Triceps                        5          5  Wrist ext                      5          5  Wrist flex                     5          5  Grip                             5  5  Hip flex                        5          5  Hip ext                         5          5  Knee ext                      5          5  ADF                             5          5  APF                             5          5                    Diagnostic Results     The results of the diagnostic studies have been reviewed by myself:    Radiologic Studies  MRI Lumbar Spine W WO Contrast    Result Date: 06/21/2019  Abnormal MRI of the lumbosacral spine with and without contrast: 1. A small disc bulge at L4-5. 2. No foraminal narrowing or spinal stenosis. 3. No signs of acute infection. ReadingStation:[id number]      Results     Procedure Component Value Units Date/Time    Basic Metabolic Panel [[id number]]  (Abnormal) Collected: 06/21/19 1643    Specimen: Plasma Updated: 06/21/19 1746     Sodium 142 mMol/L      Potassium 3.9 mMol/L      Chloride 111 mMol/L      CO2 23 mMol/L      Calcium 9.2 mg/dL      Glucose 89 mg/dL      Creatinine 4.13 mg/dL      BUN 13 mg/dL      Anion Gap 24.4 mMol/L      BUN / Creatinine Ratio 17.6 Ratio      EGFR 110 mL/min/1.67m2      Osmolality Calculated 283 mOsm/kg     Beta HCG, Qual, Serum [[id number]] Collected:  06/21/19 1643    Specimen: Plasma Updated: 06/21/19 1746     BHCG Qualitative Negative    CBC and differential [[id number]] Collected: 06/21/19 1643    Specimen: Blood Updated: 06/21/19 1728     WBC 9.0 K/cmm      RBC 4.78 M/cmm      Hemoglobin 14.4 gm/dL      Hematocrit 59.5 %      MCV 87 fL      MCH 30 pg      MCHC 34 gm/dL      RDW 63.8 %      PLT CT 271 K/cmm      MPV 8.4 fL      Neutrophils % 55.6 %      Lymphocytes 33.4 %      Monocytes 6.2 %      Eosinophils % 3.7 %      Basophils % 1.2 %      Neutrophils Absolute 5.0 K/cmm      Lymphocytes Absolute 3.0  K/cmm      Monocytes Absolute 0.6 K/cmm      Eosinophils Absolute 0.3 K/cmm      Basophils Absolute 0.1 K/cmm                          Consultation     None    Medical Decision Making     This patient presents with back/neck pain that seems musculoskeletal in origin. Based on my history and examination, several diagnoses including cauda equina syndrome, infection, AAA, kidney stones, have been considered and are unlikely. No life-threatening etiologies of back pain are discernible at this time, and the patient's symptoms will be managed with symptomatic care. I advised the patient to return to the emergency department immediately should they develop worsening pain, fever, weakness, bowel or bladder symptoms, or any acute concerns .  The patient was deemed well enough for discharge.  Diagnostic impression and plan were discussed with the patient and/or family.  If ordered, results of lab/radiology tests were discussed with the patient and/or family. Questions were answered and concerns were addressed.  The patient was encouraged to follow-up with their primary care provider or specialist if not improved.        9:23 PM --I was called back to the bedside by the nursing staff after the patient had been given her discharge instructions by myself.  She was upset because she felt she was confused about whether her diagnosis was back pain or bulging disc.  I attempted  to explain the patient's diagnosis to her and she became irate and yelled and screamed at myself as well as the nursing staff.  Patient was cursing and demanding that she had no way to get home.  When I asked if the patient could have her IV removed by myself because she did not want to allow the nurses to do it she stated yes that I could remove the IV.  She straightens her arm for me and I began to attempt this and then she kicked up at me.  I held her arm straight and removed her IV.  The nurses applied a Band-Aid.  The patient continued become irate and upset despite my attempts to talk to her and so security had to be called to help escort her out of the emergency department.    ED Medication Orders (From admission, onward)    Start Ordered     Status Ordering Provider    06/21/19 1845 06/21/19 1854  gadoterate meglumine (DOTAREM) injection 10 mmol  IMG once as needed     Route: Intravenous  Ordered Dose: 20 mL     Last MAR action: Imaging Agent Given VAN WIE, Aran Menning F JR.    06/21/19 1707 06/21/19 1706  sodium chloride 0.9 % bolus 1,000 mL  Once in ED     Route: Intravenous  Ordered Dose: 1,000 mL     Last MAR action: Stopped VAN WIE, Virgil Slinger F JR.    06/21/19 1707 06/21/19 1706  ketorolac (TORADOL) injection 15 mg  Once in ED     Route: Intravenous  Ordered Dose: 15 mg     Last MAR action: Given VAN WIE, Kesler Wickham F JR.    06/21/19 1707 06/21/19 1706  LORazepam (ATIVAN) injection 1 mg  Once in ED     Route: Intravenous  Ordered Dose: 1 mg     Last MAR action: Given VAN WIE, Talena Neira F JR.  In addition to the above history, please see nursing notes. Allergies, meds, past medical, family, social hx, and the results of the diagnostic studies performed have been reviewed by myself.     Diagnosis / Disposition     Clinical Impression  1. Acute left-sided low back pain without sciatica        Disposition  ED Disposition     ED Disposition Condition Date/Time Comment    Discharge  Mon Jun 21, 2019  9:10 PM  Taylor Wells discharge to home/self care.    Condition at disposition: Stable            Follow up for Discharged Patients  [facility]  [address]  [place] [address]  [phone]  Schedule an appointment as soon as possible for a visit in 3 days        Prescriptions for Discharged Patients  New Prescriptions    CYCLOBENZAPRINE (FLEXERIL) 10 MG TABLET    Take 1 tablet (10 mg total) by mouth every 8 (eight) hours as needed for Muscle spasms    KETOROLAC (TORADOL) 10 MG TABLET    Take 1 tablet (10 mg total) by mouth 3 (three) times daily as needed for Pain                         Procedures   NONE    Procedures            Jane Canary, D.O.    Note:  This chart was generated by the Epic EMR system/speech recognition and may contain inherent errors or omissions not intended by the user. Grammatical errors, random word insertions, deletions, pronoun errors and incomplete sentences are occasional consequences of this technology due to software limitations. Not all errors are caught or corrected. If there are questions or concerns about the content of this note or information contained within the body of this dictation they should be addressed directly with the author for clarification        Izora Gala., DO  06/21/19 2112       Izora Gala., DO  06/21/19 2129

## 2019-06-21 NOTE — ED Notes (Signed)
Supervised Judson Roch, [place] perform rectal exam.

## 2019-06-21 NOTE — ED Provider Notes (Signed)
[facility]   EMERGENCY DEPARTMENT    Pt Name: Taylor Wells, Taylor Wells DOB:Mar 17, 1990 Age: 29 y.o. female   MRN:  [id number] Encounter Date:  06/21/2019   ED PA: Judson Wells, DMSc, PA-C ED Attending: Alger Simons, MD   Room:  E8/ED8-A PCP: Taylor Wells, None, MD     For the duration of this visit, I wore appropriate PPE to include  Personal Protective Equipment: Face Shield, N95 and Surgical / [employer]ffant Cap and the patient wore appropriate PPE to include face mask over the nose and mouth.       Diagnosis / Disposition     Clinical Impression  1. Acute low back pain with radicular symptoms, duration less than 6 weeks    2. Paresthesias        Disposition  ED Disposition     ED Disposition Condition Date/Time Comment    Transfer to Another Facility  Mon Jun 21, 2019  2:09 PM Taylor Wells should be transferred out to [facility] ED.          Follow Up  No follow-up provider specified.    Prescriptions  New Prescriptions    No medications on file       MDM / Course in ED     Medications   HYDROcodone-acetaminophen (NORCO) 5-325 MG per tablet 1 tablet (1 tablet Oral Given 06/21/19 1404)       Orders Placed This Encounter   Procedures    Urinalysis with Microscopic    HCG, Qualitative, Urine       ED Course as of Jun 20 1504   Mon Jun 21, 2019   1406 I spoke with Dr. Linna Wells, ED attending [facility] who accepts this patient for transfer.     [SD]      ED Course User Index  [SD] Taylor Farber, PA       MDM       DDx: Musculoskeletal low back pain, muscle spasm, radiculopathy, herniated nucleus pulposus, cauda equina syndrome, epidural abscess, amongst others  Symptoms most consistent with musculoskeletal low back pain with radiculopathy however given the subjective statements of numbness/tingling/abnormal sensation in the hip/pelvic area I feel the patient would be best served by MR.  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  She would like to avoid being discharged  home with any narcotic pain medicine but states she is okay with narcotic pain medicine while in the emergency department because she does not have the option to abuse it.  Consult made with Dr. Linna Wells, ED attending [facility] who accepts this patient for transfer. Patient in agreement with this plan.     Subjective   History of Presenting Illness   Chief complaint: Back Pain    HPI/ROS is limited by: none  HPI/ROS given by: Patient    Location: low back pain  Quality: throbbing/ shooting  Duration: 1 week  Severity: constant, intermittent, moderate  Pain Score: 4/10  Aggravating: heating pad, standing up straight, going up the stairs  Alleviating: ice    Taylor Wells is a 29 y.o. female who presents with constant lower back pain that radiates into the back of legs.  One week pta she developed Lt lower back pain that radiated into the buttocks and back of Lt leg.  About two days ago notes that she developed Rt sided lower back pain that radiated into the legs as well.  She describes this pain as  a constant throbbing pain that is a 4/10 in severity; however, has intermittent shooting pain that increases to a 8/10 in severity.  Also notes that she's having slight numbness and tingling to her lower back that radiates to her hips.  The pain is worse when standing fully straight, walking down the stairs, and when applying a heating pad to the area.  The pain is slightly relieved with ice.  Associated sx include numbness and tingling to lower extremities and generalized BLE weakness.  Denies urinary retention, bowel or bladder incontinence/retention, fever, chills, headache, abdominal pain, CP, SOB,.  No recent back injury or lifting anything heavy.  Notes she has a toddler which she lifts everyday, but has had difficulty doing lately.   Has taken [employer], tylenol arthritis, icy hot, with minimal relief of pain.  About two years ago pt was dx with sciatica while she was pregnant and notes this  feels similar.    Review of Systems     Review of Systems   Constitutional: Negative for chills and fever.   HENT: Negative.    Eyes: Negative.    Respiratory: Negative.  Negative for shortness of breath.    Cardiovascular: Negative for chest pain, palpitations and leg swelling.   Gastrointestinal: Negative.    Genitourinary: Negative for decreased urine volume, difficulty urinating, dysuria and flank pain.   Musculoskeletal: Positive for back pain. Negative for neck pain.   Skin: Negative.    Neurological: Positive for weakness and numbness. Negative for headaches.   Hematological: Negative.        Allergies/Medications     Allergies   Allergen Reactions    Penicillins Rash       Current/Home Medications    CALCIUM CARBONATE (TUMS) 500 MG CHEWABLE TABLET    Chew 1 tablet by mouth daily    MULTIPLE VITAMINS-MINERALS (MULTIVITAMIN WITH MINERALS) TABLET    Take 1 tablet by mouth daily       Past History     Medical: Pt has a past medical history of Depression and Gastroesophageal reflux disease.    Surgical: Pt has a past surgical history that includes Hernia repair and Cholecystectomy.    Family: Thefamily history includes Arthritis in her sister.    Social: Pt reports that she has been smoking cigarettes. She has a 6.50 pack-year smoking history. She does not have any smokeless tobacco history on file. She reports previous alcohol use. She reports current drug use.         Objective     Physical Exam     Vitals:    06/21/19 1159 06/21/19 1208 06/21/19 1303 06/21/19 1339   BP: 113/56 113/55 95/54 101/51   Pulse: 61 65  60   Resp: 19 (!) 31  18   Temp: 98.5 F (36.9 C)      TempSrc: Oral      SpO2:  98% 97% 96%   Weight: 98 kg      Height: 1.702 m          Physical Exam   Constitutional: She is oriented to person, place, and time. She appears well-developed and well-nourished. No distress.   HENT:   Head: Normocephalic and atraumatic.   Cardiovascular: Normal rate, regular rhythm and normal heart sounds.   Pulses:        Dorsalis pedis pulses are 2+ on the right side and 2+ on the left side.        Posterior tibial pulses are 2+ on  the right side and 2+ on the left side.   Pulmonary/Chest: Effort normal and breath sounds normal. No respiratory distress.   Abdominal: Soft. There is no abdominal tenderness.   Musculoskeletal:      Cervical back: Normal range of motion. No tenderness. No spinous process tenderness or muscular tenderness. Normal range of motion. Normal.      Thoracic back: No pain, tenderness or bony tenderness.      Lumbar back: Pain and tenderness present. No deformity.   Neurological: She is alert and oriented to person, place, and time. She has normal strength and normal reflexes. No sensory deficit.   Reflex Scores:       Tricep reflexes are 2+ on the right side and 2+ on the left side.       Bicep reflexes are 2+ on the right side and 2+ on the left side.       Brachioradialis reflexes are 2+ on the right side and 2+ on the left side.       Patellar reflexes are 2+ on the right side and 2+ on the left side.       Achilles reflexes are 2+ on the right side and 2+ on the left side.  Strength 5/5 and equal BLE.   Rectal exam normal tone.  Loistine Chance, RN chaperone)  Lambert Mody sensation intact throughout perineum, upper legs, lower legs, feet.     Skin: Skin is warm and dry.   Nursing note and vitals reviewed.      Diagnostic Results     The results of the diagnostic studies below have been reviewed by myself:    Radiologic Studies  No results found.    Lab Studies  Results     Procedure Component Value Units Date/Time    Urinalysis with Microscopic [[id number]]  (Abnormal) Collected: 06/21/19 1250    Specimen: Urine, Random Updated: 06/21/19 1313     Color, UA Yellow     Clarity, UA Clear     Urine Specific Gravity 1.025     pH, Urine 7.0 pH      Protein, UR Negative mg/dL      Glucose, UA Negative mg/dL      Ketones UA Negative mg/dL      Bilirubin, UA Negative mg/dL      Blood, UA Small mg/dL      Nitrite, UA  Negative     Urobilinogen, UA 0.2 mg/dL      Leukocyte Esterase, UA Negative Leu/uL      UR Micro Performed     WBC, UA 1-2 /hpf      RBC, UA 15-20 /hpf      Bacteria, UA None /hpf      Squam Epithel, UA 5-10 /lpf     HCG, Qualitative, Urine [[id number]] Collected: 06/21/19 1250    Specimen: Urine, Random Updated: 06/21/19 1300     Urine HCG Qualitative Negative          EKG:        Procedures   Procedures       Critical Care              Taylor Wells  DMSc, PA-C, EM-CAQ    Note:  This chart was generated by the Epic EMR system/ speech recognition and may contain inherent errors, including typographical, or omissions not intended by the user.    In addition to the above history, please see nursing notes. Allergies, meds, past medical, family, social, history and the results  of the diagnostic studies have been reviewed by myself.          Taylor Wells, [place]  06/21/19 1506       Taylor Simons, MD  06/21/19 [phone]

## 2019-06-21 NOTE — Discharge Instructions (Signed)
Back Pain (Acute or Chronic)    Back pain is one of the most common problems. The good news is that most people feel better in 1 to 2 weeks, and most of the rest in 1 to 2 months. Most people can remain active.  People who have paindescribe it differently--noteveryone is the same.   The pain can be sharp, stabbing, shooting, aching, cramping or burning.   Movement, standing, bending, lifting, sitting, or walking may worsen pain.   It can be limited to one spot or area, or it can be more generalized.   It can spread upwards, to the front, or go down your arms or legs (sciatica).   It can cause muscle spasm.  Most of the time, mechanical problems with the musclesor spine cause the pain. Mechanical problemsare usually caused by an injury to the muscles or ligaments. Illness can cause back pain, but it's usually not caused by a serious illness. Mechanical problems include:   Physical activity such as sports, exercise, work, or normal activity   Overexertion, lifting, pushing, pulling incorrectly or too aggressively   Sudden twisting, bending, or stretching from an accident, or accidental movement   Poor posture   Stretching or moving wrong, without noticing pain at the time   Poor coordination, lack of regular exercise (check with your doctor about this)   Spinal disc disease or arthritis   Stress  Pain can also be related to pregnancy, or illness like appendicitis, bladder or kidney infections, pelvic infections, and many other things.  Acute back pain usually gets better in1 to 2 weeks. Back pain related to disk disease, arthritis in the spinal joints, or narrowing of the spinal canal (spinal stenosis) can become chronic and last for months or years.  Unless you had a physical injury such as a car accident or fall, X-rays are usually not needed for the first assessment of back pain. If pain continues and does not respond to medical treatment, you may need X-rays and other tests.  Home care  Try  this home care advice:   When in bed, tryto find a position of comfort. A firm mattress is best. Try lying flat on your back with pillows under your knees. You can also try lying on your side with your knees bent up toward your chest and a pillow between your knees.   At first, don't try to stretch out the sore spots. If there is a strain, it's not like the good soreness you get after exercising without an injury. In this case, stretching may make it worse.   Don't sit for long periods, as in a long car ride or during othertravel. This puts more stress on the lower back than standing or walking.   During the first 24 to 72 hours after an acute injury or flare up of chronic back pain, apply an ice pack to the painful area for 20 minutes and then remove it for 20 minutes. Do this over a period of 60 to 90 minutes or several times a day. This will reduce swelling and pain. Wrap the ice pack in a thin towel or plastic to protect your skin.   You can start with ice, then switch to heat. Heat (hot shower, hot bath, or heating pad) reduces pain and works well for muscle spasms. Heat can be applied to the painful area for 20 minutes then remove it for 20 minutes. Do this over a period of 60 to 90 minutes or several times a   day. Don't sleep on a heating pad. It can lead to skin burns or tissue damage.   You can alternate ice and heat therapy. Talk with your doctor aboutthe best treatment for your back pain.   Therapeutic massage can help relax the back muscles without stretching them.   Be aware of safe lifting methods and don't lift anything without stretching first.  Medicines  Talk to your doctor before using medicine, especially if you have other medical problems or are taking other medicines.   You may use over-the-counter medicine as directed on the bottle to control pain, unless another pain medicine was prescribed. If you have chronic conditions like diabetes, liver or kidney disease, stomach ulcers, or  gastrointestinal bleeding, or are taking blood thinners, talk to your doctor before taking any medicine.   Be careful if you are given a prescription medicines, narcotics, or medicine for muscle spasms. They can cause drowsiness, affect your coordination, reflexes, and judgement. Don't drive or operate heavy machinery.  Follow-up care  Follow up with your healthcare provider, or as advised.  If X-rays were taken, you will be told of any new findings that may affect your care  Call 911  Call 911 if any of the following occur:   Trouble breathing   Confusion   Very drowsy or trouble awakening   Fainting or loss of consciousness   Rapid or very slow heart rate   Loss of bowel or bladder control  When to seek medical advice  Call your healthcare provider right away if any of these occur:   Pain becomes worse or spreads to your legs   Weakness or numbness in one or both legs   Numbness in the groin or genital area  [employer] last reviewed this educational content on 10/28/2017   2000-2020 [employer] [address], [place] [address]. All rights reserved. This information is not intended as a substitute for professional medical care. Always follow your healthcare professional's instructions.

## 2019-06-21 NOTE — ED Notes (Addendum)
Security deescalated pt and this RN able to answer questions for pt regarding discharge instructions and prescriptions. Pt escorted to lobby by security. Pt able to ambulate w/o issue at this time.

## 2019-07-08 ENCOUNTER — Ambulatory Visit: Payer: Medicaid Managed Care Other | Attending: Family Medicine | Admitting: Family Medicine

## 2019-07-08 ENCOUNTER — Encounter (RURAL_HEALTH_CENTER): Payer: Self-pay | Admitting: Family Medicine

## 2019-07-08 VITALS — BP 110/72 | HR 78 | Temp 98.6°F | Ht 66.5 in | Wt 207.2 lb

## 2019-07-08 DIAGNOSIS — M545 Low back pain, unspecified: Secondary | ICD-10-CM

## 2019-07-08 MED ORDER — CYCLOBENZAPRINE HCL 10 MG PO TABS
10.0000 mg | ORAL_TABLET | Freq: Two times a day (BID) | ORAL | 1 refills | Status: DC | PRN
Start: 2019-07-08 — End: 2019-09-07

## 2019-07-08 MED ORDER — NAPROXEN 500 MG PO TABS
500.0000 mg | ORAL_TABLET | Freq: Two times a day (BID) | ORAL | 3 refills | Status: DC
Start: 2019-07-08 — End: 2019-09-07
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
## 2019-07-11 ENCOUNTER — Emergency Department: Payer: Medicaid Managed Care Other

## 2019-07-11 ENCOUNTER — Emergency Department
Admission: EM | Admit: 2019-07-11 | Discharge: 2019-07-11 | Disposition: A | Discharge status: Home / Self Care | Payer: Medicaid Managed Care Other | Attending: Physician Assistant | Admitting: Physician Assistant

## 2019-07-11 DIAGNOSIS — N200 Calculus of kidney: Secondary | ICD-10-CM | POA: Insufficient documentation

## 2019-07-11 DIAGNOSIS — N831 Corpus luteum cyst of ovary, unspecified side: Secondary | ICD-10-CM | POA: Insufficient documentation

## 2019-07-11 DIAGNOSIS — R1032 Left lower quadrant pain: Secondary | ICD-10-CM | POA: Insufficient documentation

## 2019-07-11 LAB — VH URINALYSIS WITH MICROSCOPIC
Bilirubin, UA: NEGATIVE mg/dL
Glucose, UA: NEGATIVE mg/dL
Ketones UA: NEGATIVE mg/dL
Leukocyte Esterase, UA: NEGATIVE Leu/uL
Nitrite, UA: NEGATIVE
Protein, UR: NEGATIVE mg/dL
Urine Specific Gravity: 1.025 (ref 1.001–1.040)
Urobilinogen, UA: 0.2 mg/dL
pH, Urine: 6.5 pH (ref 5.0–8.0)

## 2019-07-11 LAB — COMPREHENSIVE METABOLIC PANEL
ALT: 12 U/L (ref 0–55)
AST (SGOT): 9 U/L — ABNORMAL LOW (ref 10–42)
Albumin/Globulin Ratio: 1.27 Ratio (ref 0.80–2.00)
Albumin: 4.2 gm/dL (ref 3.5–5.0)
Alkaline Phosphatase: 65 U/L (ref 40–145)
Anion Gap: 15.3 mMol/L (ref 7.0–18.0)
BUN / Creatinine Ratio: 22.8 Ratio (ref 10.0–30.0)
BUN: 18 mg/dL (ref 7–22)
Bilirubin, Total: 0.4 mg/dL (ref 0.1–1.2)
CO2: 20.8 mMol/L (ref 20.0–30.0)
Calcium: 9.7 mg/dL (ref 8.5–10.5)
Chloride: 107 mMol/L (ref 98–110)
Creatinine: 0.79 mg/dL (ref 0.60–1.20)
EGFR: 101 mL/min/{1.73_m2} (ref 60–150)
Globulin: 3.3 gm/dL (ref 2.0–4.0)
Glucose: 99 mg/dL (ref 71–99)
Osmolality Calculated: 279 mOsm/kg (ref 275–300)
Potassium: 4.1 mMol/L (ref 3.5–5.3)
Protein, Total: 7.5 gm/dL (ref 6.0–8.3)
Sodium: 139 mMol/L (ref 136–147)

## 2019-07-11 LAB — CBC AND DIFFERENTIAL
Basophils %: 1.3 % (ref 0.0–3.0)
Basophils Absolute: 0.1 10*3/uL (ref 0.0–0.3)
Eosinophils %: 3.3 % (ref 0.0–7.0)
Eosinophils Absolute: 0.3 10*3/uL (ref 0.0–0.8)
Hematocrit: 46.1 % (ref 36.0–48.0)
Hemoglobin: 15.4 gm/dL (ref 12.0–16.0)
Lymphocytes Absolute: 2.3 10*3/uL (ref 0.6–5.1)
Lymphocytes: 27.7 % (ref 15.0–46.0)
MCH: 28 pg (ref 28–35)
MCHC: 34 gm/dL (ref 31–36)
MCV: 84 fL (ref 80–100)
MPV: 9.1 fL (ref 6.0–10.0)
Monocytes Absolute: 0.5 10*3/uL (ref 0.1–1.7)
Monocytes: 6.3 % (ref 3.0–15.0)
Neutrophils %: 61.4 % (ref 42.0–78.0)
Neutrophils Absolute: 5.2 10*3/uL (ref 1.7–8.6)
PLT CT: 270 10*3/uL (ref 130–440)
RBC: 5.51 10*6/uL — ABNORMAL HIGH (ref 3.80–5.00)
RDW: 13.6 % (ref 10.5–14.5)
WBC: 8.4 10*3/uL (ref 4.0–11.0)

## 2019-07-11 LAB — URINE HCG QUALITATIVE: Urine HCG Qualitative: NEGATIVE

## 2019-07-11 LAB — LIPASE: Lipase: 30 U/L (ref 8–78)

## 2019-07-11 MED ORDER — ONDANSETRON HCL 4 MG/2ML IJ SOLN
INTRAMUSCULAR | Status: AC
Start: 2019-07-11 — End: ?
  Filled 2019-07-11: qty 2

## 2019-07-11 MED ORDER — IOHEXOL 350 MG/ML IV SOLN
100.00 mL | Freq: Once | INTRAVENOUS | Status: AC | PRN
Start: 2019-07-11 — End: 2019-07-11
  Administered 2019-07-11: 14:00:00 100 mL via INTRAVENOUS

## 2019-07-11 MED ORDER — SODIUM CHLORIDE 0.9 % IV BOLUS
1000.00 mL | Freq: Once | INTRAVENOUS | Status: AC
Start: 2019-07-11 — End: 2019-07-11
  Administered 2019-07-11: 13:00:00 1000 mL via INTRAVENOUS

## 2019-07-11 MED ORDER — ONDANSETRON HCL 4 MG/2ML IJ SOLN
4.00 mg | Freq: Once | INTRAMUSCULAR | Status: AC
Start: 2019-07-11 — End: 2019-07-11
  Administered 2019-07-11: 13:00:00 4 mg via INTRAVENOUS

## 2019-07-11 NOTE — ED Triage Notes (Signed)
Patient here with back and abdominal pain for past two weeks. Seen here for same then

## 2019-07-11 NOTE — Discharge Instructions (Signed)
Unknown Causes of Abdominal Pain(Female)    The exact cause of your belly (abdominal) pain is not clear. This does not mean that this is something to worry about. Everyone likes to know the exact cause of the problem. But sometimes with belly pain, there is no clear-cut cause, and this could be a good thing. The good news is that your symptoms can be treated, and you will feel better.  Your condition does not seem serious now. But sometimes the signs of a serious problem may take more time to appear. For this reason,it is important for you to watch for any new symptoms, problems,or worsening of your condition.  Over the next few days, the abdominal pain may come and go. Or it may be constant. Other common symptoms can include nausea and vomiting. Sometimes it can be difficult to tell if you feel nauseous. You may just feel bad and not connect that feeling to nausea. Constipation, diarrhea, and a fever may go along with the pain.  The pain may continue even if treated correctly over the following days. Depending on how things go, sometimes the cause can become clear and may need moreor different treatment. Additional evaluations, medicines, or tests may also be needed.  Home care  Your healthcare provider may prescribe medicine for pain, symptoms, or an infection. Follow the healthcare provider's instructions for taking these medicines.  General care   Rest as much as you can until your next exam. No strenuous activities.   Try to find positions that ease discomfort. A small pillow placed on the abdomen may help relieve pain.   Something warm on your abdomen (such as a heating pad) may help, but be careful not to burn yourself.  Diet   Don'tforce yourself to eat, especially if having cramps, vomiting, or diarrhea.   Water is important so you don't get dehydrated. Soup may also be good. Sports drinks may also help, especially if they are not too acidic. Don't drink sugary drinks as this can make things  worse. Take liquids in small amounts. Don'tguzzle them.   Caffeine sometimes makes the pain and cramping worse.   Don't takedairy products if you have vomiting or diarrhea.   Don't eat large amounts at a time. Wait a few minutes between bites.   Eat a diet low in fiber (called a low-residue diet). Foods allowed include refined breads, white rice, fruit and vegetable juices without pulp, tender meats. These foods will pass more easily through the intestine.   Don't havewhole-grain foods, whole fruits and vegetables, meats, seeds and nuts, fried or fatty foods, dairy, alcohol and spicy foods until your symptoms go away.  Follow-up care  Follow up with your healthcare provider, or as advised, if your pain does not begin to improve in the next 24 hours.  Call 911  Call911 if any of these occur:   Trouble breathing   Confusion   Fainting or loss of consciousness   Rapid heart rate   Seizure  When to seek medical advice  Call your healthcare provider right away if any of these occur:   Pain gets worse or moves to the right lower abdomen   New or worsening vomiting or diarrhea   Swelling of the abdomen   Unable to pass stool for more than3 days   Fever of 100.4F (38C) or higher, or as directed by your healthcare provider.   Blood in vomit or bowel movements (dark red or black color)   Yellow color of eyes   and skin (jaundice)   Weakness, dizziness   Chest, arm, back, neck, or jaw pain   Unexpected vaginal bleeding or missed period   Can't keep down liquids or water and you are getting dehydrated  [employer] last reviewed this educational content on 06/28/2016   2000-2020 [employer] [address], [place] [address]. All rights reserved. This information is not intended as a substitute for professional medical care. Always follow your healthcare professional's instructions.

## 2019-07-11 NOTE — ED Provider Notes (Signed)
[facility]   EMERGENCY DEPARTMENT    Pt Name: Taylor Wells, Taylor Wells DOB:1990/12/21 Age: 29 y.o. female   MRN:  [id number] Encounter Date:  07/11/2019   ED PA: Judson Roch, DMSc, PA-C ED Attending: Alger Simons, MD   Room:  E5/ED5-A PCP: Duffy Rhody, MD     For the duration of this visit, I wore appropriate PPE to include  Personal Protective Equipment: Face Shield, N95 and Surgical / Bouffant Cap and the patient wore appropriate PPE to include face mask over the nose and mouth.       Diagnosis / Disposition     Clinical Impression  1. Left lower quadrant abdominal pain    2. Corpus luteum cyst    3. Kidney stones        Disposition  ED Disposition     ED Disposition Condition Date/Time Comment    Discharge  Sun Jul 11, 2019  3:00 PM Syd Milbert [facility] [place] discharge to home/self care.    Condition at disposition: Stable          Follow Up  Duffy Rhody, MD  [address]  Family and Internal Medicine  [place] [address]  [phone]          [facility] Emergency Department  [address]  [place] [address]  [phone]    As needed, If symptoms worsen      Prescriptions  New Prescriptions    No medications on file       MDM / Course in ED     Medications   sodium chloride 0.9 % bolus 1,000 mL (1,000 mLs Intravenous New Bag 07/11/19 1325)   ondansetron (ZOFRAN) injection 4 mg (4 mg Intravenous Given 07/11/19 1326)   iohexol (OMNIPAQUE) 350 MG/ML injection 100 mL (100 mLs Intravenous Imaging Agent Given 07/11/19 1349)       Orders Placed This Encounter   Procedures    CT Abdomen Pelvis W IV/ WO PO Cont    CBC and differential    Comprehensive metabolic panel    Lipase    Urinalysis with Microscopic    HCG, Qualitative, Urine    Saline lock IV # 1            MDM       DDx: Diverticulitis, pregnancy, ovarian cyst, constipation, colitis, UTI, ureterolithiasis, amongst many others  Diagnostics reassuring.  Discussed CT findings with the patient.  Patient  states that she feels comfortable and reassured.  She was mostly worried about possible constipation.  We discussed diet recommendations.  Follow-up with PCP. Discussed reasons to return to the ED. Patient verbalized understanding.  All questions answered to patient's satisfaction.  Patient in agreement with this plan.       Subjective   History of Presenting Illness   Chief complaint: Back Pain (Patient here with back pain and down legs, Pubic area pain as well)    HPI/ROS is limited by: none  HPI/ROS given by: patient    Location: low back, abd LLQ  Quality: back pain, constipation, abd pain, vomiting  Duration: weeks, worse today  Severity: constant  Pain Score: 4/10  Aggravating: unknown  Alleviating: none    Taylor Wells is a 29 y.o. female who presents with complaint of low back pain that she has been dealing with for a while.  States that she is not as concerned about that because it has not changed.  She is currently seeing a  chiropractor for her back.  She states that she does not always have a regular bowel movement but she has only had 3 bowel movements in the past 3 weeks.  She is also starting to have more abdominal pain in the left lower quadrant area and had 2 episodes of emesis today.  Denies any fevers or chills.  States that she is worried that she could be so constipated that it is causing her to vomit.  She denies any dysuria, hematuria, flank pain.  Review of Systems     Review of Systems   Constitutional: Negative for chills and fever.   HENT: Negative.    Eyes: Negative.    Respiratory: Negative.    Cardiovascular: Negative.    Gastrointestinal: Positive for abdominal pain, constipation, nausea and vomiting. Negative for blood in stool and diarrhea.   Genitourinary: Negative.  Negative for dysuria, flank pain, frequency and hematuria.   Musculoskeletal: Positive for back pain. Negative for neck pain.   Skin: Negative.    Neurological: Negative.    Hematological: Negative.         Allergies/Medications     Allergies   Allergen Reactions    Milk-Related Compounds Anaphylaxis    Latex Hives and Rash    Ativan [Lorazepam]      PANIC    Promethazine      Panic attack    Metoclopramide Other (See Comments)     Panic attack    Penicillins Rash       Current/Home Medications    CALCIUM CARBONATE (TUMS) 500 MG CHEWABLE TABLET    Chew 1 tablet by mouth daily    CYCLOBENZAPRINE (FLEXERIL) 10 MG TABLET    Take 1 tablet (10 mg total) by mouth 2 (two) times daily as needed for Muscle spasms    MULTIPLE VITAMINS-MINERALS (MULTIVITAMIN WITH MINERALS) TABLET    Take 1 tablet by mouth daily    NAPROXEN (NAPROSYN) 500 MG TABLET    Take 1 tablet (500 mg total) by mouth 2 (two) times daily with meals       Past History     Medical: Pt has a past medical history of Depression, Gastroesophageal reflux disease, and Sleep apnea.    Surgical: Pt has a past surgical history that includes Hernia repair and Cholecystectomy.    Family: Thefamily history includes COPD in her mother; Lupus in her sister; No known problems in her father; Rheum arthritis in her sister; Schizophrenia in her mother.    Social: Pt reports that she has been smoking cigarettes. She has a 6.50 pack-year smoking history. She has never used smokeless tobacco. She reports previous alcohol use. She reports current drug use. Drug: Marijuana.         Objective     Physical Exam     Vitals:    07/11/19 1304 07/11/19 1330 07/11/19 1400   BP: 112/71 105/61 111/75   Pulse: 82     Resp: 16     Temp: 99.5 F (37.5 C)     TempSrc: Oral     SpO2: 99% 96% 98%   Weight: 94.3 kg     Height: 1.676 m         Physical Exam  Vitals and nursing note reviewed.   Constitutional:       General: She is not in acute distress.     Appearance: She is well-developed.   HENT:      Head: Normocephalic and atraumatic.   Cardiovascular:  Rate and Rhythm: Normal rate and regular rhythm.   Pulmonary:      Effort: Pulmonary effort is normal. No respiratory distress.       Breath sounds: Normal breath sounds. No wheezing, rhonchi or rales.   Abdominal:      General: There is no distension.      Palpations: Abdomen is soft.      Tenderness: There is abdominal tenderness (mild LLQ). There is no right CVA tenderness, left CVA tenderness or guarding.   Musculoskeletal:        Back:    Skin:     General: Skin is warm and dry.   Neurological:      Mental Status: She is alert and oriented to person, place, and time.         Diagnostic Results     The results of the diagnostic studies below have been reviewed by myself:    Radiologic Studies  CT Abdomen Pelvis W IV/ WO PO Cont    Result Date: 07/11/2019  1. No acute findings in the abdomen or pelvis. 2. 18 mm peripherally enhancing right corpus luteal cyst. No free fluid. 3. Punctate calyceal stone right mid kidney. 8mm cortical cyst ventral lower pole right kidney. 4. Cholecystectomy. ReadingStation:[id number]      Lab Studies  Results     Procedure Component Value Units Date/Time    CBC and differential [[id number]]  (Abnormal) Collected: 07/11/19 1315    Specimen: Blood Updated: 07/11/19 1343     WBC 8.4 K/cmm      RBC 5.51 M/cmm      Hemoglobin 15.4 gm/dL      Hematocrit 40.9 %      MCV 84 fL      MCH 28 pg      MCHC 34 gm/dL      RDW 81.1 %      PLT CT 270 K/cmm      MPV 9.1 fL      Neutrophils % 61.4 %      Lymphocytes 27.7 %      Monocytes 6.3 %      Eosinophils % 3.3 %      Basophils % 1.3 %      Neutrophils Absolute 5.2 K/cmm      Lymphocytes Absolute 2.3 K/cmm      Monocytes Absolute 0.5 K/cmm      Eosinophils Absolute 0.3 K/cmm      Basophils Absolute 0.1 K/cmm     Comprehensive metabolic panel [[id number]]  (Abnormal) Collected: 07/11/19 1315    Specimen: Plasma Updated: 07/11/19 1337     Sodium 139 mMol/L      Potassium 4.1 mMol/L      Chloride 107 mMol/L      CO2 20.8 mMol/L      Calcium 9.7 mg/dL      Glucose 99 mg/dL      Creatinine 2.13 mg/dL      BUN 18 mg/dL      Protein, Total 7.5 gm/dL      Albumin 4.2 gm/dL      Alkaline  Phosphatase 65 U/L      ALT 12 U/L      AST (SGOT) 9 U/L      Bilirubin, Total 0.4 mg/dL      Albumin/Globulin Ratio 1.27 Ratio      Anion Gap 15.3 mMol/L      BUN / Creatinine Ratio 22.8 Ratio      EGFR 101 mL/min/1.38m2  Osmolality Calculated 279 mOsm/kg      Globulin 3.3 gm/dL     Lipase [[id number]] Collected: 07/11/19 1315    Specimen: Plasma Updated: 07/11/19 1337     Lipase 30 U/L     Urinalysis with Microscopic [[id number]]  (Abnormal) Collected: 07/11/19 1318    Specimen: Urine, Random Updated: 07/11/19 1336     Color, UA Yellow     Clarity, UA Hazy     Urine Specific Gravity 1.025     pH, Urine 6.5 pH      Protein, UR Negative mg/dL      Glucose, UA Negative mg/dL      Ketones UA Negative mg/dL      Bilirubin, UA Negative mg/dL      Blood, UA Moderate mg/dL      Nitrite, UA Negative     Urobilinogen, UA 0.2 mg/dL      Leukocyte Esterase, UA Negative Leu/uL      UR Micro Performed     WBC, UA 3-4 /hpf      RBC, UA 20-30 /hpf      Bacteria, UA Few /hpf      Squam Epithel, UA 10-20 /lpf     HCG, Qualitative, Urine [[id number]] Collected: 07/11/19 1318    Specimen: Urine, Random Updated: 07/11/19 1329     Urine HCG Qualitative Negative          EKG:        Procedures   Procedures       Critical Care              Judson Roch  DMSc, PA-C, EM-CAQ    Note:  This chart was generated by the Epic EMR system/ speech recognition and may contain inherent errors, including typographical, or omissions not intended by the user.    In addition to the above history, please see nursing notes. Allergies, meds, past medical, family, social, history and the results of the diagnostic studies have been reviewed by myself.          Eino Farber, [place]  07/11/19 1501       Alger Simons, MD  07/11/19 1726

## 2019-08-10 ENCOUNTER — Ambulatory Visit: Admission: RE | Admit: 2019-08-10 | Payer: Medicaid Managed Care Other | Source: Ambulatory Visit

## 2019-08-29 ENCOUNTER — Ambulatory Visit: Admission: RE | Admit: 2019-08-29 | Payer: Medicaid Managed Care Other | Source: Ambulatory Visit

## 2019-09-07 ENCOUNTER — Ambulatory Visit: Payer: Medicaid Managed Care Other | Attending: Family Medicine | Admitting: Family Medicine

## 2019-09-07 ENCOUNTER — Encounter (RURAL_HEALTH_CENTER): Payer: Self-pay | Admitting: Family Medicine

## 2019-09-07 VITALS — BP 110/62 | HR 73 | Temp 97.9°F | Wt 216.6 lb

## 2019-09-07 DIAGNOSIS — R350 Frequency of micturition: Secondary | ICD-10-CM | POA: Insufficient documentation

## 2019-09-07 LAB — VH POCT URINALYSIS (DIPSTICK)
Bilirubin, UA POCT: NEGATIVE
Glucose, UA POCT: NEGATIVE mg/dL
Ketones, UA POCT: NEGATIVE mg/dL
Nitrite, UA POCT: NEGATIVE
POCT Leukocytes, UA: NEGATIVE
POCT Spec Gravity, UA: 1.015 (ref 1.001–1.035)
POCT pH, UA: 5 (ref 5–8)
Protein, UA POCT: NEGATIVE mg/dL
Urobilinogen, UA: 0.2 mg/dL

## 2019-09-07 NOTE — Progress Notes (Signed)
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 female who presents with    Chief Complaint   Patient presents with    Back Pain     has blood when she wipes, frequent urination, back pain , not going alot  alittle fever nausea       HPI  29 year old female presents with chief complaint of back pain.  She also complains of frequent urination, dysuria with urination, small amount of blood when she wipes after urinating.  She also complains of some mild intermittent nausea.  No fever or flank pain.    Of note she was seen in the ER in June for back pain had a CT of the lumbar spine which did show a punctate right sided kidney stone, as well as a 2 mm peripheral enhancing right corpus luteal cyst, 8 mm cortical cyst ventral lower pole right kidney.       Review of Systems   Dysuria, urinary frequency    Denies fevers, chills or flank pain.        Objective:        Vitals:    09/07/19 1138   BP: 110/62   Pulse: 73   Temp: 97.9 F (36.6 C)   SpO2: 98%         Physical Exam  Gen: NAD, normal appearance  MSK: No CVA tenderness bilaterally on exam.  Neuro: A&O x3  Skin: no rashes, no wounds        Assessment:       1. Urinary frequency          Plan:       Aquanetta was seen today for back pain.    Diagnoses and all orders for this visit:    Urinary frequency  -     POCT UA Dipstick (10 Multitest)    Urinalysis showed trace blood, otherwise unremarkable.  It is possible she passed this small stone, recommend increase fluids by mouth aiming for at least 2 L a day.  Given the size of previously visualized stone do not feel that she needs Flomax at this time.    It is also possible that her symptoms could be explained by her acute on chronic back pain and perhaps she is getting a breakthrough period on her Mirena.  She states that typically she does not have a menstrual cycle period but this can occur and can present with her current symptomatology.    I encouraged her to follow-up with her back  specialist for her chronic low back pain.  She was encouraged to follow-up with me if her symptoms do not improve with time.      Return if symptoms worsen or fail to improve.    Duffy Rhody, MD

## 2019-09-20 ENCOUNTER — Ambulatory Visit: Admission: RE | Admit: 2019-09-20 | Payer: Medicaid Managed Care Other | Source: Ambulatory Visit

## 2019-09-28 ENCOUNTER — Ambulatory Visit: Admission: RE | Admit: 2019-09-28 | Payer: Medicaid Managed Care Other | Source: Ambulatory Visit

## 2019-09-29 ENCOUNTER — Ambulatory Visit: Payer: Medicaid Managed Care Other

## 2019-10-06 IMAGING — US US MFM OB DETAIL+14 WK
1 series · 13 of 28 positions shown · non-contrast
Comparison: none

[Series 1: us mfm ob detail+14 wk · 97 acquisitions, 13 frames shown]
[im 4/97]
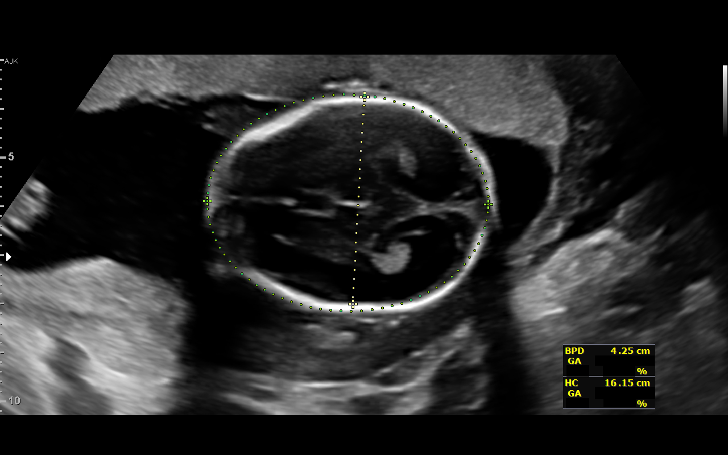
[im 11/97]
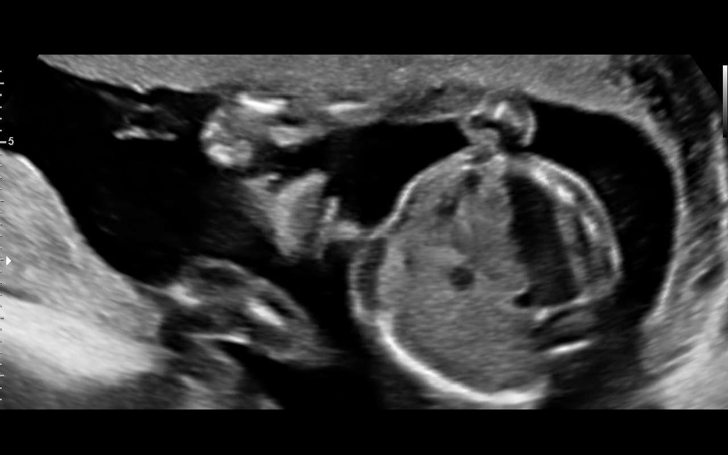
[im 18/97]
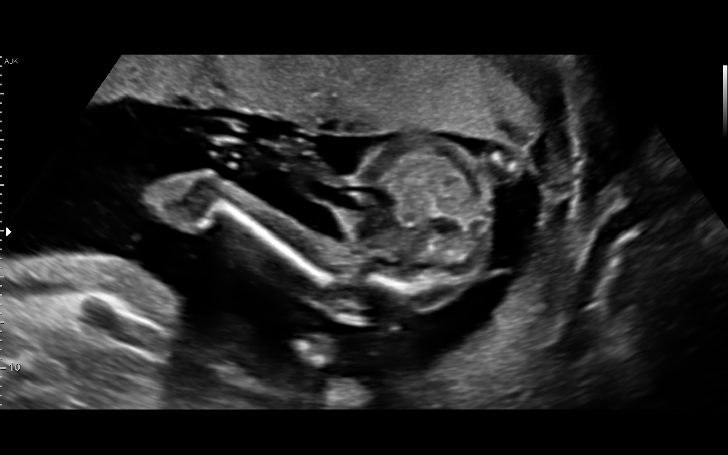
[im 25/97]
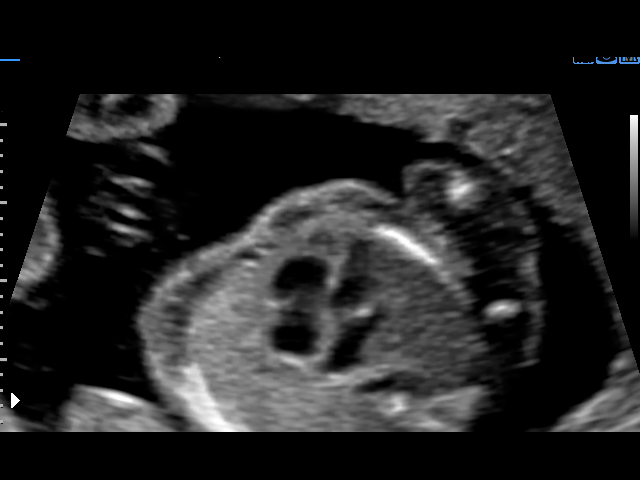
[im 33/97]
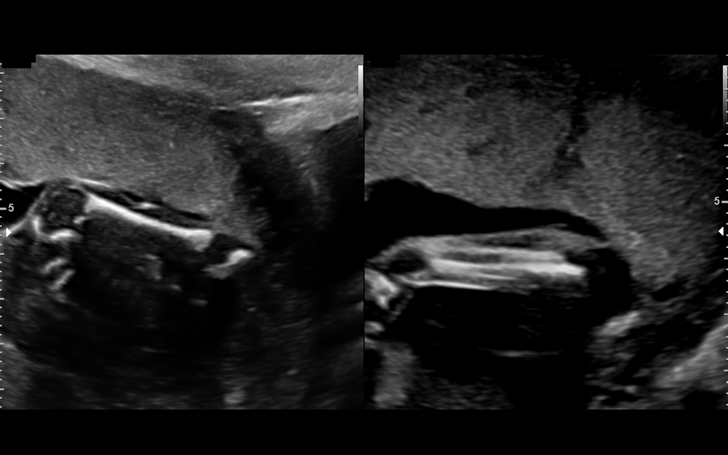
[im 40/97]
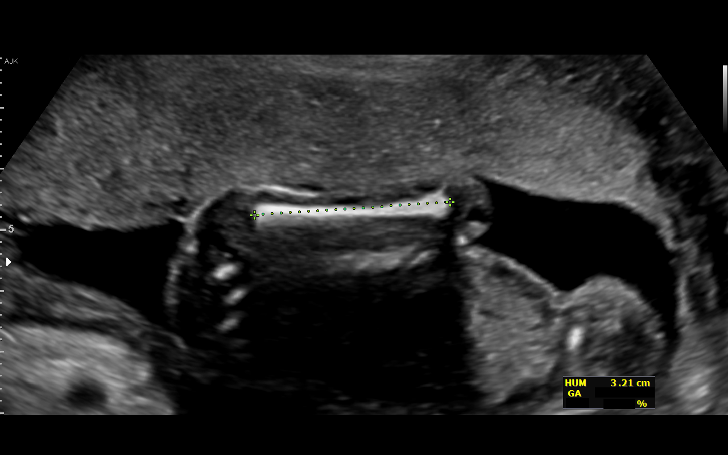
[im 50/97]
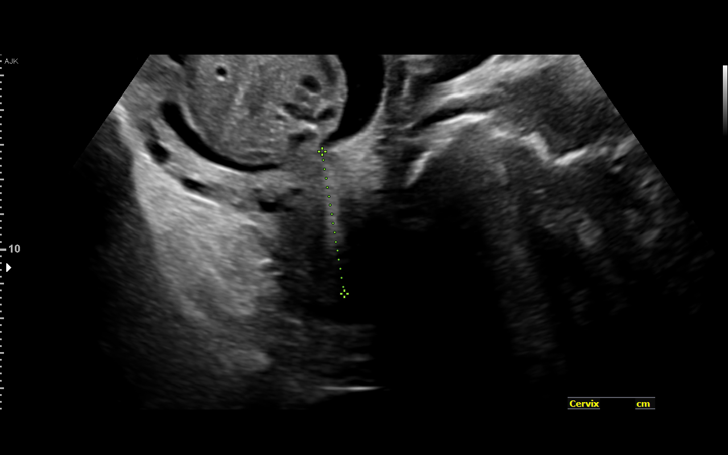
[im 57/97]
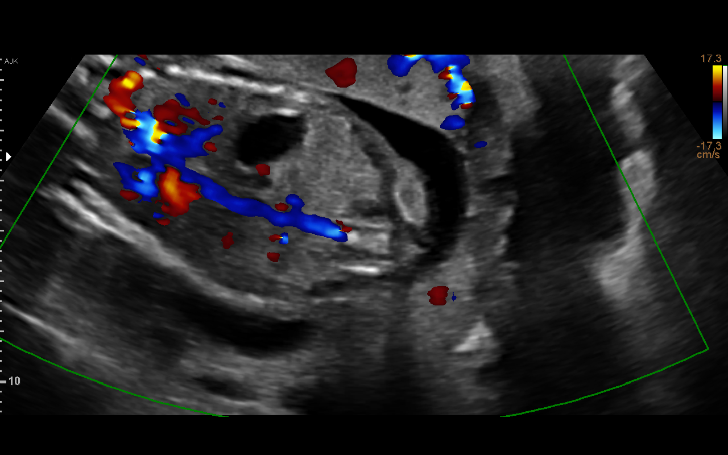
[im 65/97]
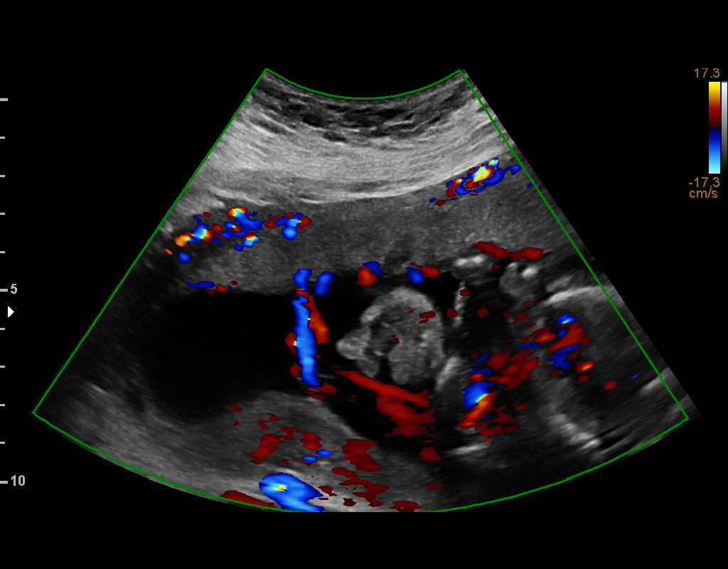
[im 72/97]
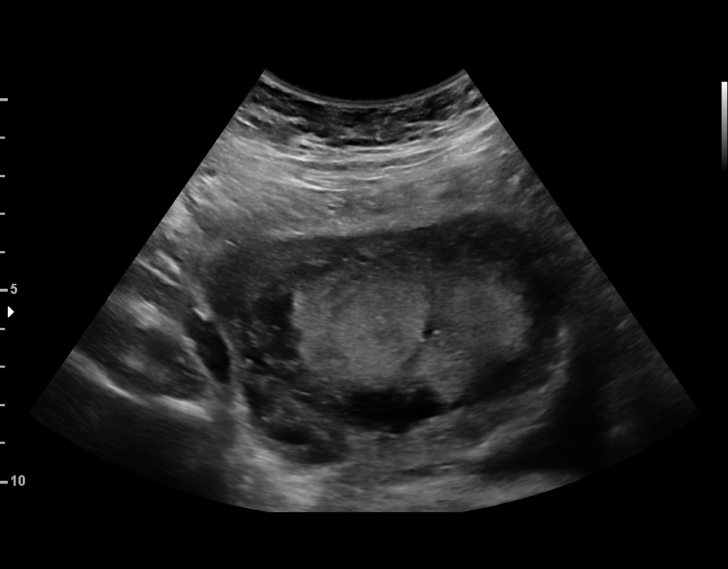
[im 79/97]
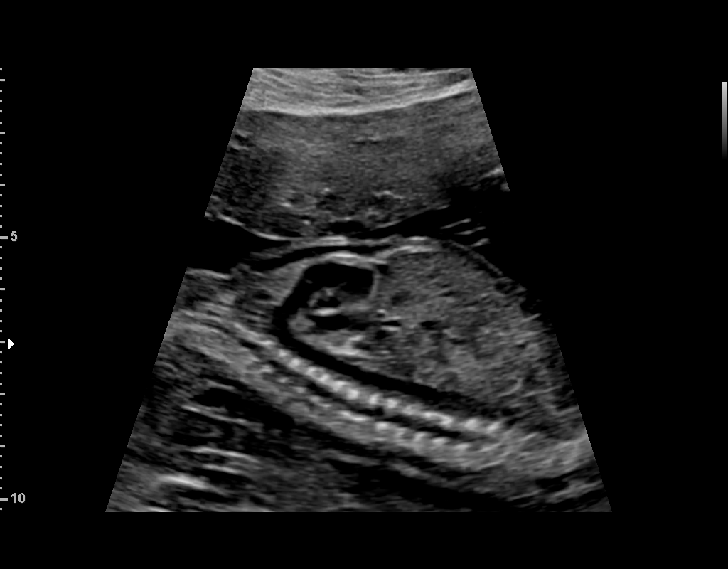
[im 86/97]
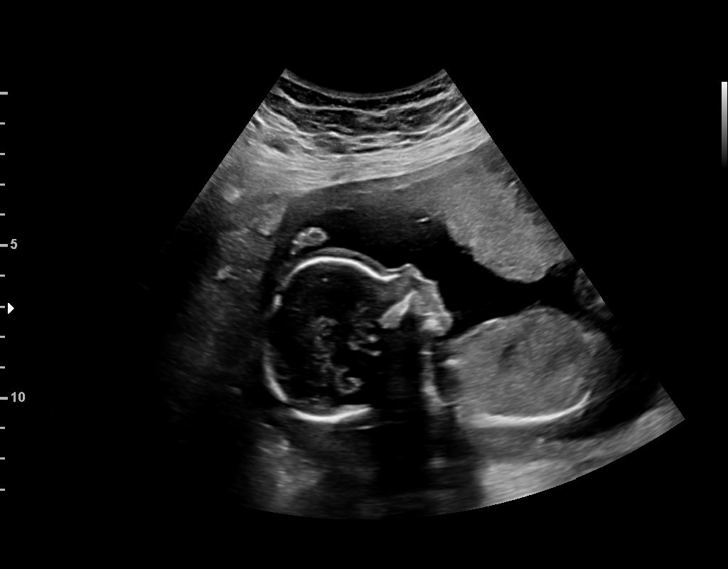
[im 93/97]
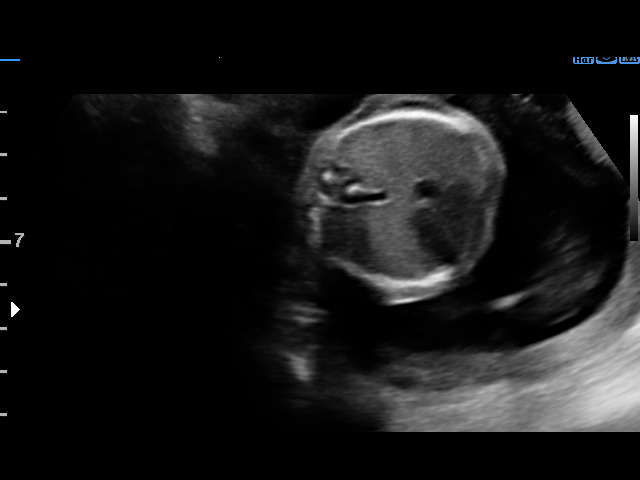

[13 of 28 positions shown; findings below may reference images not displayed]

AWA CNM

 ----------------------------------------------------------------------

 ----------------------------------------------------------------------
Indications

  Encounter for antenatal screening for
  malformations (low risk NIPs, Neg AFP)
  Poor obstetric history: Previous preterm
  delivery, antepartum (34 weeks)
  Obesity complicating pregnancy, second
  trimester
  19 weeks gestation of pregnancy
 ----------------------------------------------------------------------
Vital Signs

 BMI:
Fetal Evaluation

 Num Of Fetuses:          1
 Fetal Heart Rate(bpm):   150
 Cardiac Activity:        Observed
 Presentation:            Variable
 Placenta:                Anterior
 P. Cord Insertion:       Visualized, central

 Amniotic Fluid
 AFI FV:      Within normal limits
Biometry

 BPD:      42.9  mm     G. Age:  19w 0d         50  %    CI:        72.15   %    70 - 86
                                                         FL/HC:       19.4  %    16.1 -
 HC:      160.7  mm     G. Age:  18w 6d         37  %    HC/AC:       1.03       1.09 -
 AC:      155.4  mm     G. Age:  20w 5d         91  %    FL/BPD:      72.5  %
 FL:       31.1  mm     G. Age:  19w 4d         66  %    FL/AC:       20.0  %    20 - 24
 HUM:      32.1  mm     G. Age:  20w 5d       > 95  %
 CER:        20  mm     G. Age:  19w 0d         50  %
 NFT:       5.2  mm

 LV:        5.9  mm
 CM:        3.4  mm

 Est. FW:     329   gm   0 lb 12 oz      60  %
OB History

 Gravidity:    6         Prem:   1         SAB:   4
 TOP:          0       Ectopic:  0        Living: 1
Gestational Age

 LMP:           19w 0d        Date:  08/25/17                 EDD:   06/01/18
 U/S Today:     19w 4d                                        EDD:   05/28/18
 Best:          19w 0d     Det. By:  LMP  (08/25/17)          EDD:   06/01/18
Anatomy

 Cranium:               Appears normal         Aortic Arch:            Appears normal
 Cavum:                 Appears normal         Ductal Arch:            Appears normal
 Ventricles:            Appears normal         Diaphragm:              Appears normal
 Choroid Plexus:        Appears normal         Stomach:                Appears normal, left
                                                                       sided
 Cerebellum:            Appears normal         Abdomen:                Appears normal
 Posterior Fossa:       Appears normal         Abdominal Wall:         Appears nml (cord
                                                                       insert, abd wall)
 Nuchal Fold:           Appears normal         Cord Vessels:           Appears normal (3
                                                                       vessel cord)
 Face:                  Appears normal         Kidneys:                Appear normal
                        (orbits and profile)
 Lips:                  Appears normal         Bladder:                Appears normal
 Thoracic:              Appears normal         Spine:                  Not well visualized
 Heart:                 Appears normal         Upper Extremities:      Appears normal
                        (4CH, axis, and
                        situs)
 RVOT:                  Appears normal         Lower Extremities:      Appears normal
 LVOT:                  Appears normal

 Other:  Fetus appears to be female. Heels visualized. Technically difficult due
         to maternal habitus and fetal position.
Cervix Uterus Adnexa

 Cervix
 Length:           3.98  cm.
 Normal appearance by transabdominal scan.

 Uterus
 No abnormality visualized.

 Left Ovary
 Not visualized.

 Right Ovary
 Not visualized.

 Adnexa
 No abnormality visualized.
Impression

 Normal interval growth.  No ultrasonic evidence of structural
 fetal anomalies.
 Suboptimal views of the fetal spine
 Cervical length appropriate given prior PTD
Recommendations

 Follow up in 4-5 weeks to complete the fetal anatomy.

## 2019-10-07 ENCOUNTER — Ambulatory Visit (INDEPENDENT_AMBULATORY_CARE_PROVIDER_SITE_OTHER): Payer: Medicaid Managed Care Other | Admitting: Physical Medicine & Rehabilitation

## 2019-10-13 ENCOUNTER — Other Ambulatory Visit
Admission: RE | Admit: 2019-10-13 | Discharge: 2019-10-13 | Disposition: A | Discharge status: Home / Self Care | Payer: Medicaid Managed Care Other | Source: Ambulatory Visit | Attending: Medical | Admitting: Medical

## 2019-10-13 ENCOUNTER — Ambulatory Visit: Payer: Medicaid Managed Care Other | Attending: Medical | Admitting: Medical

## 2019-10-13 ENCOUNTER — Encounter (RURAL_HEALTH_CENTER): Payer: Self-pay | Admitting: Medical

## 2019-10-13 VITALS — BP 107/66 | HR 91 | Temp 97.6°F | Resp 19

## 2019-10-13 DIAGNOSIS — Z20822 Contact with and (suspected) exposure to covid-19: Secondary | ICD-10-CM | POA: Insufficient documentation

## 2019-10-13 DIAGNOSIS — Z1152 Encounter for screening for COVID-19: Secondary | ICD-10-CM

## 2019-10-13 DIAGNOSIS — J0110 Acute frontal sinusitis, unspecified: Secondary | ICD-10-CM

## 2019-10-13 LAB — VH AMB POCT SOFIA 2(TM) FLU + SARS AG FIA
Sofia Influenza A Ag POCT: NEGATIVE
Sofia Influenza B Ag POCT: NEGATIVE
Sofia SARS-CoV-2 Ag POCT: NEGATIVE

## 2019-10-13 MED ORDER — AZITHROMYCIN 250 MG PO TABS
ORAL_TABLET | ORAL | 0 refills | Status: AC
Start: 2019-10-13 — End: 2019-10-18

## 2019-10-13 NOTE — Progress Notes (Signed)
[facility]  [address] [place], [address]  [phone]         Office Visit Note    Date Time: 10/13/2019 9:20 AM  Patient Name: Taylor Wells, Taylor Wells  DOB: 1990/08/08                                                    Subjective     For the duration of this visit, I wore appropriate PPE to include fit-tested N95 respirator, face shield, gown, and gloves. The patient wore appropriate PPE to include cloth face mask over the nose and mouth.     HPI:  This a 29 y.o. female who presents today today for   Chief Complaint   Patient presents with    Headache     sx's started Saturday-kids were tested yesterday negative    Nasal Congestion    Nausea    Sore Throat     Pt reports symptom onset 5 days ago. Headache, postnasal drip, chest tightness and congestion, sore throat. Treatment has included Mucinex -- which is not really helping.     Children have similar symptoms and both tested negative for COVID-19. Pt is partially vaccinated - awaiting second dose - and has not had any sick contacts with COVID-19 that she knows of.    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   Current smoker  No hx of lung disease   Does have hx of seasonal allergies not currently taking anything.       ROS:  Review of Systems   Constitutional: Positive for fatigue. Negative for chills and fever.   HENT: Positive for congestion, postnasal drip, rhinorrhea and sore throat. Negative for ear pain and trouble swallowing.    Respiratory: Positive for cough (especially in AM - productive of phlegm) and wheezing. Negative for chest tightness and shortness of breath.    Gastrointestinal: Positive for nausea (attributes to drainage). Negative for diarrhea and vomiting.   Musculoskeletal: Negative for myalgias.   Neurological: Positive for headaches.       Allergies   Allergen Reactions    Milk-Related Compounds Anaphylaxis    Latex Hives and Rash    Ativan [Lorazepam]      PANIC    Promethazine      Panic attack    Metoclopramide Other (See Comments)     Panic attack    Penicillins Rash       Outpatient Medications Marked as Taking for the 10/13/19 encounter (Office Visit) with Juel Burrow, PA   Medication Sig Dispense Refill    Multiple Vitamins-Minerals (multivitamin with minerals) tablet Take 1 tablet by mouth daily       Past Medical History:   Diagnosis Date    Depression     Gastroesophageal reflux disease     Sleep apnea     used bipap in past      Social History     Tobacco Use    Smoking status: Current Every Day Smoker     Packs/day: 0.50     Years: 13.00     Pack years: 6.50     Types: Cigarettes    Smokeless tobacco: Never Used   Substance Use Topics    Alcohol use: Not Currently  Objective   BP 107/66    Pulse 91    Temp 97.6 F (36.4 C) (Tympanic)    Resp 19    SpO2 96%  There is no height or weight on file to calculate BMI.    Physical Exam:  Physical Exam  Vitals reviewed.   Constitutional:       General: She is not in acute distress.     Appearance: Normal appearance.   HENT:      Head: Normocephalic and atraumatic.      Right Ear: Tympanic membrane, ear canal and external ear normal.      Left Ear: Tympanic membrane, ear canal and external ear normal.      Nose: Mucosal edema and congestion present.      Right Sinus: Frontal sinus tenderness present. No maxillary sinus tenderness.      Left Sinus: Frontal sinus tenderness present. No maxillary sinus tenderness.      Mouth/Throat:      Mouth: Mucous membranes are moist.      Pharynx: Oropharynx is clear. No oropharyngeal exudate or posterior oropharyngeal erythema.      Tonsils: No tonsillar exudate.   Cardiovascular:      Rate and Rhythm: Normal rate and regular rhythm.      Heart sounds: Normal heart sounds.   Pulmonary:       Effort: Pulmonary effort is normal.      Breath sounds: Normal breath sounds. No wheezing.   Lymphadenopathy:      Cervical: No cervical adenopathy.   Skin:     General: Skin is warm and dry.   Neurological:      General: No focal deficit present.      Mental Status: She is alert.   Psychiatric:         Mood and Affect: Mood normal.         No results found.    Results     Procedure Component Value Units Date/Time    [facility] 2 Flu + SARS Antigen FIA POCT [[id number]]  (Normal) Collected: 10/13/19 0800    Specimen: Nasal Swab COVID-19 Updated: 10/13/19 0853     [facility] SARS-CoV-2 Ag POCT Negative     [facility] Influenza A Ag POCT Negative     Sofia Influenza B Ag POCT Negative    Narrative:      Lot Number: [id number]  Expiration Date: 8295621  Internal Control: Pass  External Control: Pass                                                    Assessment and Plan:   1. Acute non-recurrent frontal sinusitis  - azithromycin (Zithromax Z-Pak) 250 MG tablet; Take 2 tablets (500 mg total) by mouth daily for 1 day, THEN 1 tablet (250 mg total) daily for 4 days.  Dispense: 6 tablet; Refill: 0    2. Encounter for screening for COVID-19  - [facility] 2 Flu + SARS Antigen FIA POCT  - SARS-CoV-2 Assay ([employer] System(TM)); Future    Rapid test negative, PCR pending.  Recommend isolation until PCR test results return  Symptoms suggest sinusitis. Discussed with pt that sinusitis is most commonly viral. However as her symptoms seem to be worsening I have prescribed a Zpack though have advised her to hold off on filling it until day 7 of  symptoms.   I also suggest OTC nasal sprays (saline or Flonase). Continue Mucinex. Drink plenty of fluids to thin phlegm. OTC ibuprofen or Tylenol as needed.     Return if symptoms worsen or fail to improve.       Number and Complexity of Problems Addressed  1 acute, uncomplicated illness or injury    Amount and/or Complexity of Data to be Reviewed and Analyzed  2 unique tests ordered    Risk of Complications  and/or Morbidity or Mortality of Patient Management  Moderate        Nicki Guadalajara, PA-C 10/13/2019 9:20 AM     This note was completed using Dragon medical speech recognition software. Grammatical errors, random word insertions, pronoun errors, incorrect word insertion, misspellings and incomplete sentences are occasional consequences of this technology due to software limitations. If there are questions or concerns about the content of this note, or information contained within the body of this dictation, they should be addressed with the provider for clarification.

## 2019-10-14 LAB — VH SARS-COV-2 ASSAY (PERKINELMER SYSTEM(TM))
Date of Onset: 20210911
Does patient reside in a congregate care setting?: NEGATIVE
Is patient employed in a healthcare setting?: NEGATIVE
SARS-CoV-2 Assay (PerkinElmer System (TM)): NOT DETECTED

## 2019-10-26 ENCOUNTER — Ambulatory Visit (INDEPENDENT_AMBULATORY_CARE_PROVIDER_SITE_OTHER): Payer: Medicaid Managed Care Other | Admitting: Physical Medicine & Rehabilitation

## 2019-10-26 ENCOUNTER — Encounter (INDEPENDENT_AMBULATORY_CARE_PROVIDER_SITE_OTHER): Payer: Self-pay | Admitting: Physical Medicine & Rehabilitation

## 2019-10-26 VITALS — BP 126/68 | HR 68

## 2019-10-26 DIAGNOSIS — M79604 Pain in right leg: Secondary | ICD-10-CM

## 2019-10-26 DIAGNOSIS — M47817 Spondylosis without myelopathy or radiculopathy, lumbosacral region: Secondary | ICD-10-CM

## 2019-10-26 DIAGNOSIS — M79605 Pain in left leg: Secondary | ICD-10-CM

## 2019-10-26 DIAGNOSIS — M5137 Other intervertebral disc degeneration, lumbosacral region: Secondary | ICD-10-CM

## 2019-10-26 DIAGNOSIS — M545 Low back pain: Secondary | ICD-10-CM

## 2019-10-26 MED ORDER — NABUMETONE 500 MG PO TABS
500.0000 mg | ORAL_TABLET | Freq: Two times a day (BID) | ORAL | 0 refills | Status: AC
Start: 2019-10-26 — End: 2019-11-16

## 2019-10-26 NOTE — Progress Notes (Signed)
New Patient Evaluation      Patient Name:                                                 Taylor Wells, Taylor Wells  DOB  1990/10/09  MRN  [id number]    Referring Physician: Duffy Rhody, MD    History of Present Illness:    As you know, Khyli Swaim  is a pleasant 29 y.o., female with the chief complaint of axial low back pain and lower limb pain located in the midline and bilateral paramidline lumbosacral region and less severe, bilateral midgluteal, lateral thighs.  The pain is 4 out of 10 in severity, and burning in quality.  Onset gradual and spontaneous, 6 months in duration.  Exacerbating factors include sitting and standing.  Alleviating factors include standing  and walking. Associated symptoms include index lower limb numbness/ tingling.     Prior Treatments: Chiropractor (Previous)    Prior Medications: Tylenol (Current) Motrin (Current)    Current Medications:    Current Outpatient Medications:     ARIPiprazole (ABILIFY) 5 MG tablet, Take 5 mg by mouth daily, Disp: , Rfl:     hydrOXYzine (ATARAX) 10 MG tablet, , Disp: , Rfl:     Multiple Vitamins-Minerals (multivitamin with minerals) tablet, Take 1 tablet by mouth daily, Disp: , Rfl:     Allergies:  Allergies   Allergen Reactions    Milk-Related Compounds Anaphylaxis    Latex Hives and Rash    Ativan [Lorazepam]      PANIC    Promethazine      Panic attack    Metoclopramide Other (See Comments)     Panic attack    Penicillins Rash       Past Medical History:  Past Medical History:   Diagnosis Date    Depression     Gastroesophageal reflux disease     Sleep apnea     used bipap in past        Past Surgical History:  Past Surgical History:   Procedure Laterality Date    CHOLECYSTECTOMY      HERNIA REPAIR         Family History:  Family History   Problem Relation Age of Onset    Rheum arthritis Sister     Lupus Sister     Schizophrenia Mother     COPD  Mother     No known problems Father        Social History:  Social History     Socioeconomic History    Marital status: Single     Spouse name: Not on file    Number of children: 2    Years of education: Not on file    Highest education level: Not on file   Occupational History    Not on file   Tobacco Use    Smoking status: Current Every Day Smoker     Packs/day: 0.50     Years: 13.00     Pack years: 6.50     Types: Cigarettes    Smokeless tobacco: Never Used   Haematologist Use: Former   Substance and Sexual Activity    Alcohol use: Not Currently    Drug use: Yes     Types: Marijuana     Comment: Marijuanna, hx of  drug abuse sober 4 yrs     Sexual activity: Yes     Partners: Male   Other Topics Concern    Not on file   Social History Narrative    Not on file     Social Determinants of Health     Financial Resource Strain:     Difficulty of Paying Living Expenses:    Food Insecurity:     Worried About Programme researcher, broadcasting/film/video in the Last Year:     Barista in the Last Year:    Transportation Needs:     Freight forwarder (Medical):     Lack of Transportation (Non-Medical):    Physical Activity:     Days of Exercise per Week:     Minutes of Exercise per Session:    Stress:     Feeling of Stress :    Social Connections:     Frequency of Communication with Friends and Family:     Frequency of Social Gatherings with Friends and Family:     Attends Religious Services:     Active Member of Clubs or Organizations:     Attends Engineer, structural:     Marital Status:    Intimate Partner Violence:     Fear of Current or Ex-Partner:     Emotionally Abused:     Physically Abused:     Sexually Abused:        Review Of Systems:    Constitutional:  Weight Gain   Neurological:  Negative   Musculoskeletal:  Muscle Cramps  and Weakness   HEENT:  Negative   Cardiovascular:  negative  Respiratory:  Negative   Gastrointestinal:  Constipation , Heartburn  and Indigestion    Genitourinary:  Negative   Skin:   Negative   Psychiatric: Anxiety , Depression , Mood Changes  and Sleep Disturbance   Female:  Negative     Physical Examination:    Vitals:    10/26/19 1557   BP: 126/68   Pulse: 68   SpO2: 97%     Constitutional: The patient is oriented to person, place and time with normal mood and affect. Ambulation nonantalgic   Skin: unremarkable  Lymph nodes: Inspection for gross deformities is negative  Girth: Upper and lower limbs are symmetric  Palpation: There is no pain on palpation of the cervical, thoracic, lumbosacral spine  Joints:  Functional range of motion in the bilateral upper and lower limbs, neck, and trunk with no evidence of instability or effusion.   Spine: Sustained Hip Flexion positive, SLR bilaterally produced left trochanteric region pain, Lumbar range of motion mildly restricted.    Neurologic:  Manual muscle testing 5/5 in the bilateral lower limbs.  Muscle stretch reflexes 2+ in the bilateral lower limbs except left achilles: 1+,  Sensation intact to light touch in the bilateral lower limbs.  Toes are down-going bilaterally, no clonus at the ankle bilaterally      Lab Review:  Dated 07/11/2019  Component   Ref Range & Units 07/11/19 1:15 PM   Sodium   136 - 147 mMol/L 139    Potassium   3.5 - 5.3 mMol/L 4.1    Chloride   98 - 110 mMol/L 107    CO2   20 - 30 mMol/L 20.8    Calcium   8.5 - 10.5 mg/dL 9.7    Glucose   71 - 99 mg/dL 99    Creatinine   1.61 -  1.20 mg/dL 7.82    BUN   7 - 22 mg/dL 18    Protein, Total   6.0 - 8.3 gm/dL 7.5    Albumin   3.5 - 5.0 gm/dL 4.2    Alkaline Phosphatase   40 - 145 U/L 65    ALT   0 - 55 U/L 12    AST (SGOT)   10 - 42 U/L 9Low     Bilirubin, Total   0.1 - 1.2 mg/dL 0.4    Albumin/Globulin Ratio   0.80 - 2.00 Ratio 1.27    Anion Gap   7 - 18 mMol/L 15.3    BUN / Creatinine Ratio   10.0 - 30.0 Ratio 22.8    EGFR   60 - 150 mL/min/1.59m2 101        Radiology Review:  Magnetic resonance imaging of the lumbosacral spine dated  06/21/2019, personal read:   L5S1 minimal desiccation, <25% loss of disc ht  L45 mild desiccation, <25% loss of disc ht, broad based protrusion with superimposed CFP.   L34 minimal desiccation, <25% loss of disc ht  L23 minimal desiccation, <25% loss of disc ht        EMG/NCV Review:   No EMG on file to review.    Outside Record Review:   Moderate review of outside medical records include a progress note from Duffy Rhody, MD      ODI: 42      Impression:  1. 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   2. Bilateral lower limb pain c/w somatic referral from #1 vs less likely, LS radiculopathy vs ITB syndrome.     Plan:  1. The patient will undergo AP, lateral and flexion-extension lumbosacral xrays to evaluate intervertebral disc height, alignment and to assess for instability.  2. The patient was prescribed spine focused physical therapy to improve biomechanics and reduce strain on the spine during recreational and occupational activities.  3. The patient was prescribed Relafen 500mg  BID for anti-inflammatory and analgesic effects.  4. Patient will trial PRN Tylenol 1000mg  TID for PO analgesia.  5. RTC in 4 weeks.   6. If no sustained relief, we will then move forward with 2-4 bilateral S1 vs multilevel transforaminal epidural steroid injections.        Landry Mellow, DO   [facility]    Thank you for allowing me to be involved in the care of this patient.  If you have any questions please contact me at [facility] at [phone]    CC: Referring Provider

## 2019-10-29 ENCOUNTER — Ambulatory Visit: Payer: Medicaid Managed Care Other

## 2019-11-12 IMAGING — US US MFM OB FOLLOW-UP
1 series · 13 of 28 positions shown · non-contrast
Comparison: none

[Series 1: us mfm ob follow-up · 13 of 46 slices shown]
[im 2/46]
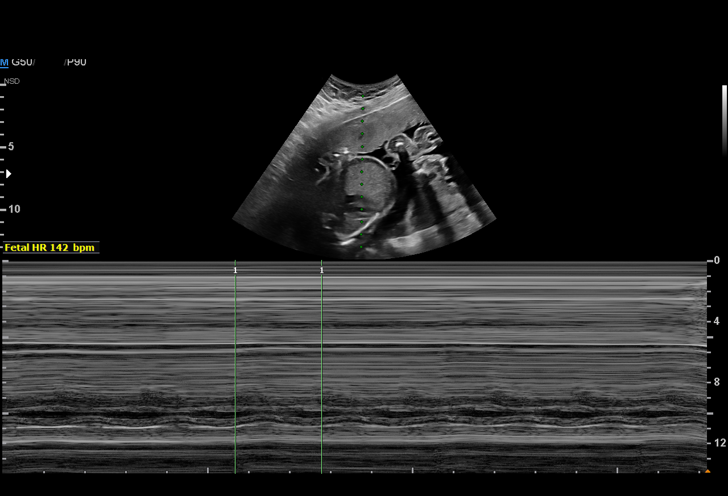
[im 6/46]
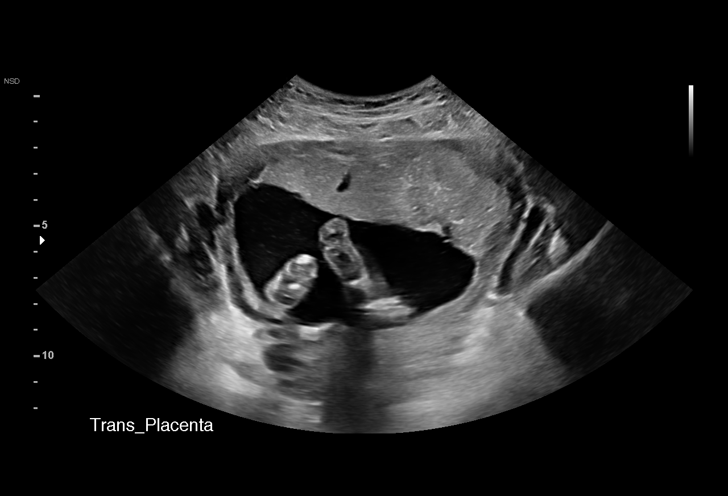
[im 9/46]
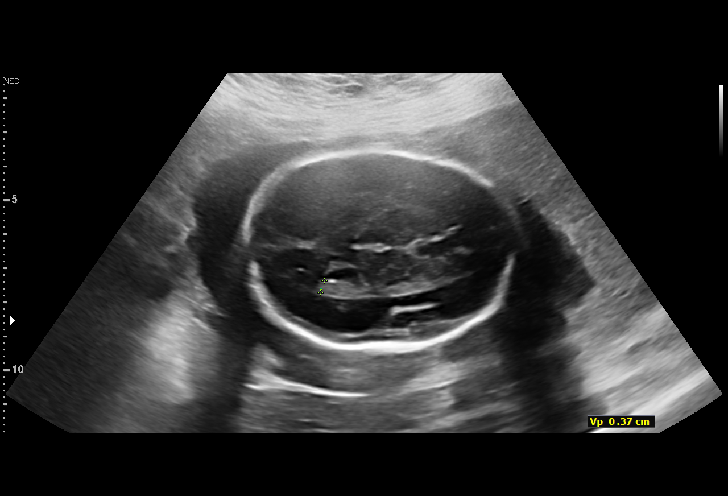
[im 12/46]
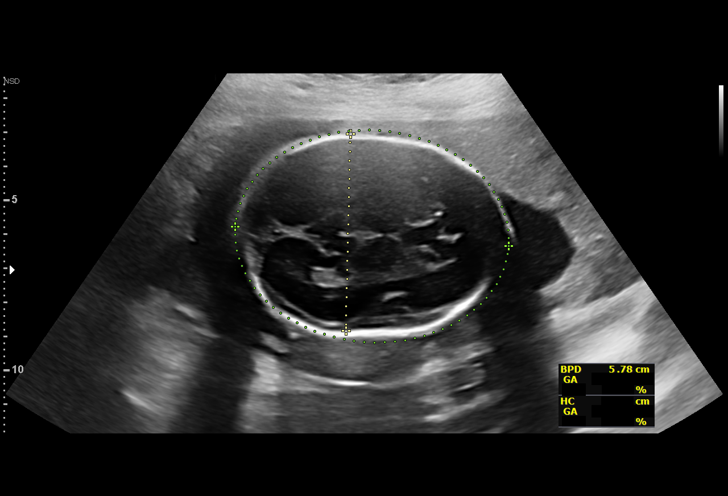
[im 16/46]
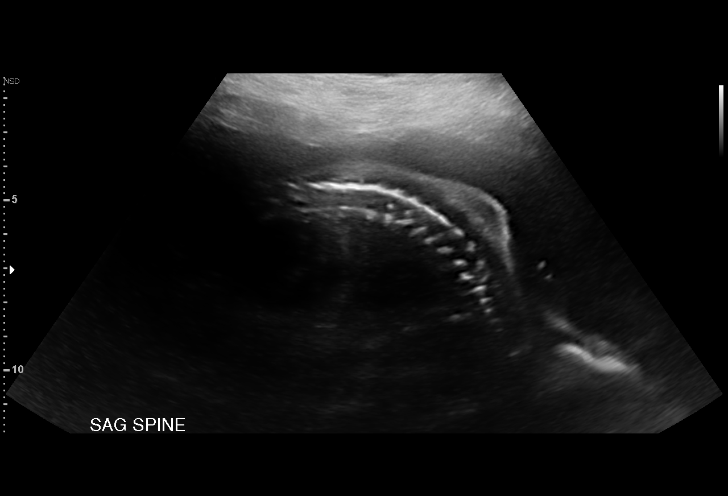
[im 19/46]
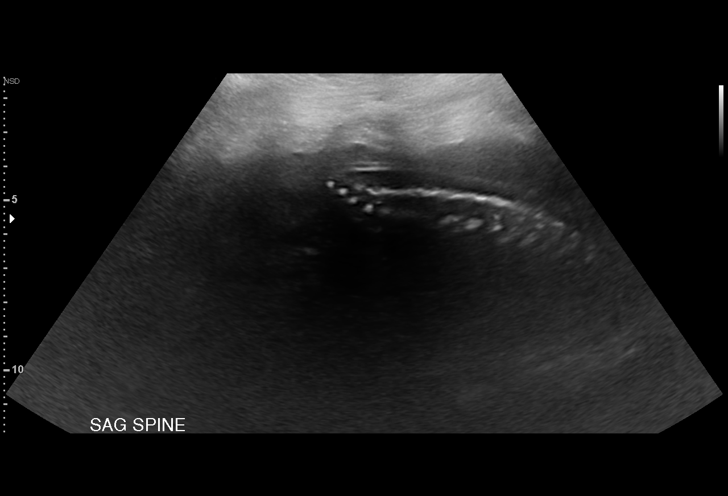
[im 24/46]
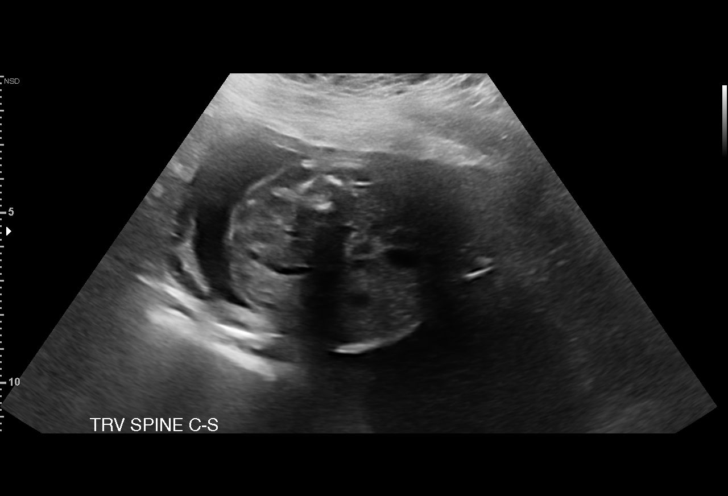
[im 27/46]
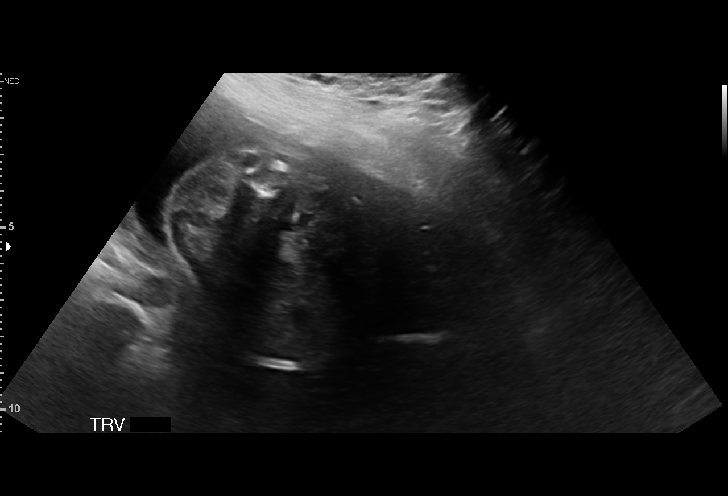
[im 31/46]
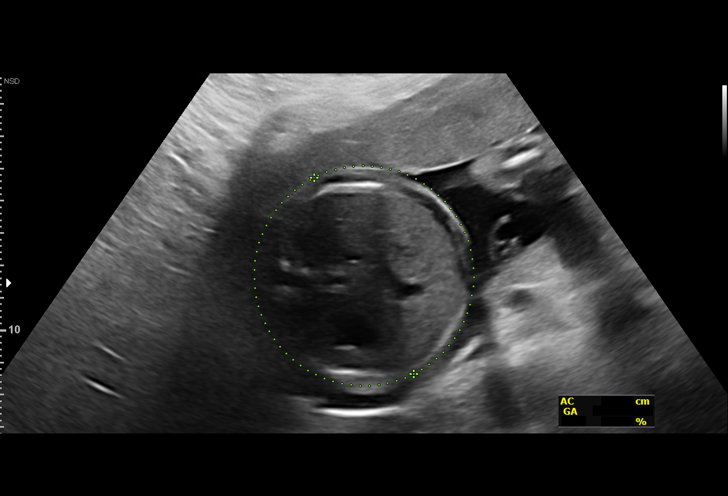
[im 34/46]
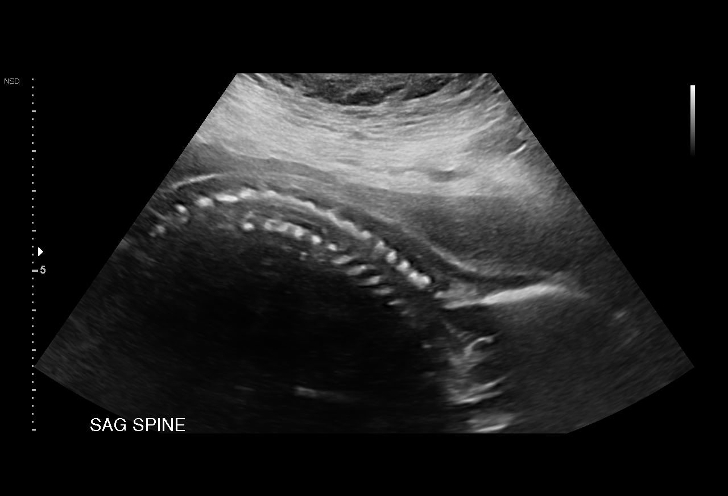
[im 37/46]
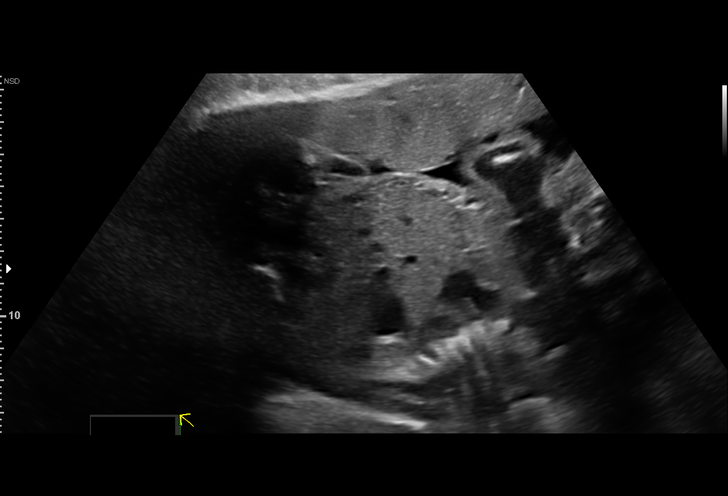
[im 41/46]
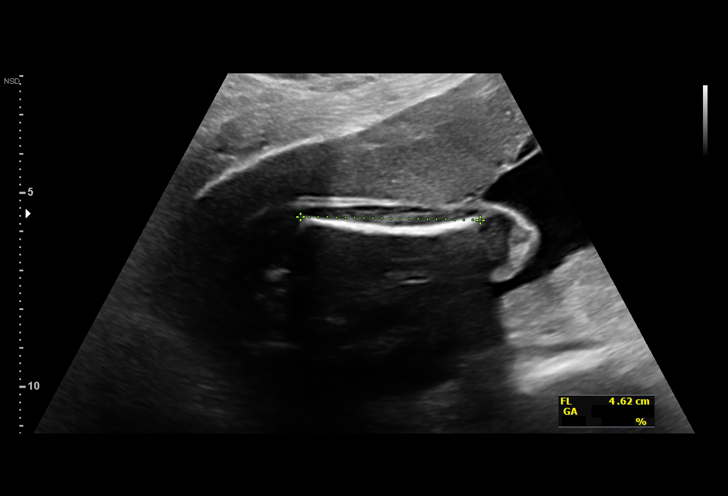
[im 44/46]
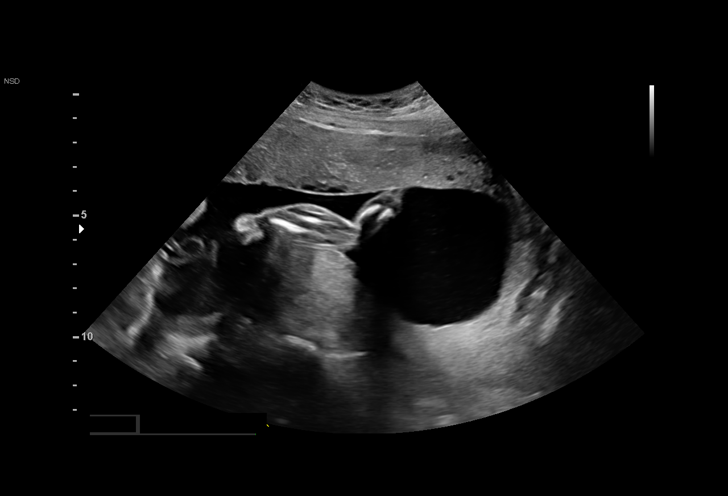

[13 of 28 positions shown; findings below may reference images not displayed]

BIFFI CNM

                                                       PENGA
 ----------------------------------------------------------------------

 ----------------------------------------------------------------------
Indications

  Encounter for antenatal screening for
  malformations (low risk NIPs, Neg AFP)
  24 weeks gestation of pregnancy
  Poor obstetric history: Previous preterm
  delivery, antepartum (34 weeks)
  Obesity complicating pregnancy, second
  trimester
  Antenatal follow-up for nonvisualized fetal
  anatomy
 ----------------------------------------------------------------------
Vital Signs

                                                Height:        5'6"
Fetal Evaluation

 Num Of Fetuses:          1
 Fetal Heart              142
 Rate(bpm):
 Cardiac Activity:        Observed
 Fetal Lie:               Transverse MOELYANA
 Presentation:            Feet presenting
 Placenta:                Anterior
 P. Cord Insertion:       Previously seen as normal

 Amniotic Fluid
 AFI FV:      Within normal limits

                             Largest Pocket(cm)

Biometry

 BPD:      57.8  mm     G. Age:  23w 5d         23  %    CI:         67.59  %    70 - 86
                                                         FL/HC:       20.2  %    18.7 -
 HC:        225  mm     G. Age:  24w 4d         41  %    HC/AC:       1.07       1.05 -
 AC:        211  mm     G. Age:  25w 4d         80  %    FL/BPD:      78.5  %    71 - 87
 FL:       45.4  mm     G. Age:  25w 0d         61  %    FL/AC:       21.5  %    20 - 24

 LV:        3.7  mm

 Est. FW:     781   g    1 lb 12 oz      70  %
                    m
OB History

 Gravidity:    6         Prem:   1         SAB:   4
 TOP:          0       Ectopic:  0        Living: 1
Gestational Age

 LMP:           24w 2d        Date:  08/25/17                 EDD:    06/01/18
 U/S Today:     24w 5d                                        EDD:    05/29/18
 Best:          24w 2d     Det. By:  LMP  (08/25/17)          EDD:    06/01/18
Anatomy

 Cranium:               Appears normal         Aortic Arch:            Previously seen
 Cavum:                 Previously seen        Ductal Arch:            Previously seen
 Ventricles:            Appears normal         Diaphragm:              Appears normal
 Choroid Plexus:        Previously seen        Stomach:                Appears normal,
                                                                       left sided
 Cerebellum:            Previously seen        Abdomen:                Appears normal
 Posterior Fossa:       Previously seen        Abdominal Wall:         Previously seen
 Nuchal Fold:           Previously seen        Cord Vessels:           Previously seen
 Face:                  Orbits and profile     Kidneys:                Appear normal
                        previously seen
 Lips:                  Previously seen        Bladder:                Appears normal
 Thoracic:              Appears normal         Spine:                  Appears normal
 Heart:                 Appears normal         Upper Extremities:      Previously seen
                        (4CH, axis, and
                        situs)
 RVOT:                  Previously seen        Lower Extremities:      Previously seen
 LVOT:                  Previously seen

 Other:  Female gender previously seen. Heels previously visualized.
Cervix Uterus Adnexa

 Cervix
 Length:           3.68  cm.
 Normal appearance by transabdominal scan.
Impression

 Normal interval growth.
 Anatomy complete
Recommendations

 Follow up as clinically indicated

## 2019-11-15 ENCOUNTER — Encounter (RURAL_HEALTH_CENTER): Payer: Self-pay | Admitting: Family Medicine

## 2019-11-15 ENCOUNTER — Ambulatory Visit: Payer: Medicaid Managed Care Other | Attending: Family Medicine | Admitting: Family Medicine

## 2019-11-15 VITALS — BP 120/70 | HR 84 | Temp 98.0°F | Ht 66.0 in | Wt 211.0 lb

## 2019-11-15 DIAGNOSIS — Z111 Encounter for screening for respiratory tuberculosis: Secondary | ICD-10-CM

## 2019-11-15 DIAGNOSIS — Z0000 Encounter for general adult medical examination without abnormal findings: Secondary | ICD-10-CM

## 2019-11-15 NOTE — Progress Notes (Signed)
Subjective:    Patient ID: Taylor Wells is a 29 y.o. 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 Declines flu vaccination.        HPI  29 year old female presents today for annual exam.  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    Patient has no symptoms of TB, no risk factors for development of TB, and no close contacts with any TB.    Denies any complaints today.  Declines influenza and PPSV23 vaccines today.  She is a smoker.  States she has completed her COVID-19 vaccine series.      Past Medical History:   Diagnosis Date    Depression     Gastroesophageal reflux disease     Sleep apnea     used bipap in past      Family History   Problem Relation Age of Onset    Rheum arthritis Sister     Lupus Sister     Schizophrenia Mother     COPD Mother     No known problems Father          Review of Systems   Constitutional: Negative for chills and fever.   HENT: Negative for congestion, rhinorrhea and sore throat.    Respiratory: Negative for cough and shortness of breath.    Cardiovascular: Negative for chest pain and leg swelling.   Gastrointestinal: Negative for abdominal pain, diarrhea, nausea and vomiting.   Genitourinary: Negative for difficulty urinating, dysuria and hematuria.   Musculoskeletal: Negative for arthralgias and back pain.   Skin: Negative for rash and wound.   Neurological: Negative for dizziness and light-headedness.   Psychiatric/Behavioral: Negative for agitation and confusion.            Objective:        Vitals:    11/15/19 0913   BP: 120/70   Pulse: 84   Temp: 98 F (36.7 C)   SpO2: 97%         Physical Exam  Gen: NAD, normal appearance  HENT: NC, AT, no thyromegaly or lymphadenopathy.  Eyes: PERRLA  CV: RRR, no murmurs rubs or gallops.  Chest: CTAB, no rales, no wheezing  Abdo: soft, NT, ND, + BS  MSK: No edema BLEs.  Strength 5/5 x 4 extremities.  Neuro: A&O  x3, sensation intact to light touch x4 extremities.  Skin: no rashes, no wounds          Assessment:       1. Annual physical exam    2. Screening-pulmonary TB          Plan:       [name] was seen today for annual exam.    Diagnoses and all orders for this visit:    Annual physical exam  -     Lipid panel; Future  -     Vitamin D,25 OH, Total; Future    Screening-pulmonary TB  -     Cancel: Read PPD  -     Cancel: POCT TB skin test    Doing very well at her baseline today.  Encouraged regular exercise, weight loss and healthy plant-based diet.  Recent lab work reviewed today with patient and normal.  Follow-up additional labs for lipid and vitamin D.  I filled out her paperwork stating she had no conditions that would prevent  her from doing her job.  Negative screen for TB, no skin testing necessary.  She states her last Pap smear was when she became pregnant with her second child, this child is now 34 months old.  I estimate her next Pap smear to be due sometime towards the end of next year.  She declined influenza and PPSV23 vaccines today.    Return in about 1 year (around 11/14/2020).    Duffy Rhody, MD

## 2019-11-25 ENCOUNTER — Ambulatory Visit (INDEPENDENT_AMBULATORY_CARE_PROVIDER_SITE_OTHER): Payer: Medicaid Managed Care Other | Admitting: Medical

## 2019-11-28 ENCOUNTER — Emergency Department
Admission: EM | Admit: 2019-11-28 | Discharge: 2019-11-28 | Disposition: A | Discharge status: Home / Self Care | Payer: Medicaid Managed Care Other | Attending: Family Medicine | Admitting: Family Medicine

## 2019-11-28 DIAGNOSIS — Z20822 Contact with and (suspected) exposure to covid-19: Secondary | ICD-10-CM | POA: Insufficient documentation

## 2019-11-28 DIAGNOSIS — J069 Acute upper respiratory infection, unspecified: Secondary | ICD-10-CM | POA: Insufficient documentation

## 2019-11-28 DIAGNOSIS — R059 Cough, unspecified: Secondary | ICD-10-CM | POA: Insufficient documentation

## 2019-11-28 LAB — VH APTIMA SARS-COV-2 ASSAY (PANTHER SYSTEM)(TM)
Aptima SARS-CoV-2: NEGATIVE
Does patient reside in a congregate care setting?: NEGATIVE
Is patient employed in a healthcare setting?: NEGATIVE

## 2019-11-28 NOTE — Discharge Instructions (Signed)
Viral Upper Respiratory Illness (Adult)    You have a viral upper respiratory illness (URI), which is another term for the common cold. This illness is contagious during the first few days. It is spread through the air by coughing and sneezing. It may also be spread by direct contact (touching the sick person and then touching your own eyes, nose, or mouth). Frequent handwashing will decrease risk of spread. Most viral illnesses go away within 7 to 10 days with rest and simple home remedies. Sometimes the illness may last for several weeks. Antibiotics will not kill a virus, and they are generally not prescribed for this condition.  Home care   If symptoms are severe, rest at home for the first 2 to 3 days. When you resume activity, don't let yourself get too tired.   Don't smoke. If you need help stopping, talk with your healthcare provider.   Avoid being exposed to cigarette smoke (yours or others').   You may use acetaminophen or ibuprofen to control pain and fever, unless another medicine was prescribed.If you have chronic liver or kidney disease, have ever had a stomach ulcer or gastrointestinal bleeding, or are taking blood-thinning medicines, talk with your healthcare provider before using these medicines. Aspirin should never be given to anyone under 18 years of age who is ill with a viral infection or fever. It may cause severe liver or brain damage.   Your appetite may be poor, so a light diet is fine. Stay well hydrated by drinking 6 to 8 glasses of fluids per day (water, soft drinks, juices, tea, or soup). Extra fluids will help loosen secretions in the nose and lungs.   Over-the-counter cold medicines will not shorten the length of time you're sick, but they may be helpful for the following symptoms: cough, sore throat, and nasal and sinus congestion. If you take prescription medicines, ask your healthcare provider or pharmacist which over-the-counter medicines are safe to use. (Note: Don't use  decongestants if you have high blood pressure.)  Follow-up care  Follow up with your healthcare provider, or as advised.  When to seek medical advice  Call your healthcare provider right away if any of these occur:   Cough with lots of colored sputum (mucus)   Severe headache; face, neck, or ear pain   Difficultyswallowingdue to throat pain   Fever of 100.4F (38C) or higher, or as directed by your healthcare provider  Call 911  Call 911 if any of these occur:   Chest pain, shortness of breath, wheezing, or difficulty breathing   Coughing up blood   Very severe pain with swallowing, especially if it goes along with a muffled voice   [employer] last reviewed this educational content on 06/28/2016   2000-2021 [employer]. All rights reserved. This information is not intended as a substitute for professional medical care. Always follow your healthcare professional's instructions.

## 2019-11-28 NOTE — ED Provider Notes (Signed)
EMERGENCY DEPARTMENT HISTORY AND PHYSICAL EXAM    Date: 11/28/19  [facility] Name: [facility]  Attending Physician: Alger Simons, MD  Patient DOB:  [birth year]  MRN:  [id number]  Room:  H1/H1-A      History of Presenting Illness     Chief Complaint: Cough, congestion, Left ear ache     HPI/ROS is limited by: none  HPI/ROS given by: patient       Context: Taylor Wells is a 29 y.o. female who presents with having been sick for a week or so. She has had a cough, congestion and now a Left ear ache. 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 Mom denies any shortness of breath. She has had the first of her COVID vaccines, but not the second.   Location: upper respiratory system  Severity: moderate  Duration: 1 week   Quality: tightness   Associated Signs/ Symptoms: cough, congestion, Left ear ache  Exacerbation/Mitigating factors: none       PMD: Duffy Rhody, MD    Past Medical History     Past Medical History:   Diagnosis Date    Depression     Gastroesophageal reflux disease     Sleep apnea     used bipap in past        Past Surgical History     Past Surgical History:   Procedure Laterality Date    CHOLECYSTECTOMY      HERNIA REPAIR         Family History     Family History   Problem Relation Age of Onset    Rheum arthritis Sister     Lupus Sister     Schizophrenia Mother     COPD Mother     No known problems Father        Social History     Social History     Socioeconomic History    Marital status: Single     Spouse name: Not on file    Number of children: 2    Years of education: Not on file    Highest education level: Not on file   Occupational History    Not on file   Tobacco Use    Smoking status: Current Every Day Smoker     Packs/day: 0.50     Years: 13.00     Pack years: 6.50     Types: Cigarettes    Smokeless tobacco: Never Used   Haematologist Use: Former   Substance and  Sexual Activity    Alcohol use: Not Currently    Drug use: Yes     Types: Marijuana     Comment: Marijuanna, hx of drug abuse sober 4 yrs     Sexual activity: Yes     Partners: Male   Other Topics Concern    Not on file   Social History Narrative    Not on file     Social Determinants of Health     Financial Resource Strain:     Difficulty of Paying Living Expenses: Not on file   Food Insecurity:     Worried About Programme researcher, broadcasting/film/video in the Last Year: Not on file    [employer] of Food in the Last Year: Not on file   Transportation Needs:     Lack of Transportation (Medical): Not  on file    Lack of Transportation (Non-Medical): Not on file   Physical Activity:     Days of Exercise per Week: Not on file    Minutes of Exercise per Session: Not on file   Stress:     Feeling of Stress : Not on file   Social Connections:     Frequency of Communication with Friends and Family: Not on file    Frequency of Social Gatherings with Friends and Family: Not on file    Attends Religious Services: Not on file    Active Member of Clubs or Organizations: Not on file    Attends Banker Meetings: Not on file    Marital Status: Not on file   Intimate Partner Violence:     Fear of Current or Ex-Partner: Not on file    Emotionally Abused: Not on file    Physically Abused: Not on file    Sexually Abused: Not on file   Housing Stability:     Unable to Pay for Housing in the Last Year: Not on file    Number of Places Lived in the Last Year: Not on file    Unstable Housing in the Last Year: Not on file       Allergies     Allergies   Allergen Reactions    Milk-Related Compounds Anaphylaxis    Latex Hives and Rash    Ativan [Lorazepam]      PANIC    Promethazine      Panic attack    Metoclopramide Other (See Comments)     Panic attack    Penicillins Rash       Home Medications     Prior to Admission medications    Medication Sig Start Date End Date Taking? Authorizing Provider   Multiple  Vitamins-Minerals (multivitamin with minerals) tablet Take 1 tablet by mouth daily    [provider]       ED Medications Administered     ED Medication Orders (From admission, onward)    None            Review of Systems     ROS      Physical Exam     Physical Exam  Vitals and nursing note reviewed.   Constitutional:       Appearance: Normal appearance.   HENT:      Head: Normocephalic and atraumatic.      Right Ear: Tympanic membrane normal.      Left Ear: Tympanic membrane normal.      Nose: Nose normal.      Mouth/Throat:      Mouth: Mucous membranes are moist.      Pharynx: Oropharynx is clear.   Eyes:      Extraocular Movements: Extraocular movements intact.      Conjunctiva/sclera: Conjunctivae normal.   Cardiovascular:      Rate and Rhythm: Normal rate and regular rhythm.      Pulses: Normal pulses.      Heart sounds: Normal heart sounds.   Pulmonary:      Effort: Pulmonary effort is normal.      Breath sounds: Normal breath sounds.   Abdominal:      General: Abdomen is flat. Bowel sounds are normal.   Musculoskeletal:         General: Normal range of motion.      Cervical back: Normal range of motion and neck supple.   Skin:     General:  Skin is warm and dry.   Neurological:      General: No focal deficit present.      Mental Status: She is alert and oriented to person, place, and time.           Procedures     N/A    Diagnostic Study Results     EKG: N/A    Monitor: N/A    Laboratory results reviewed by ED provider:    Results     Procedure Component Value Units Date/Time    Respiratory Specimen Aptima SARS-CoV-2 Assay (Panther System)(R) [[id number]] Collected: 11/28/19 0825    Specimen: Nasopharyngeal Swab Updated: 11/28/19 0933    Narrative:      Specimen source - Nasopharyngeal Swab             Radiologic study results reviewed by ED provider:    Radiology Results (24 Hour)     ** No results found for the last 24 hours. **      .    Rendering Provider: Minette Brine II, MD      VS     Patient  Vitals for the past 24 hrs:   BP Temp Temp src Pulse Resp SpO2 Height Weight   11/28/19 0816 109/61 98.2 F (36.8 C) Oral 68 18 97 % 1.676 m 104.3 kg         Clinical Course in Emergency Department     Consults: N/A    Reevaluation: N/A     MDM: COVID, Viral URI,     Diagnosis and Disposition     Clinical Impression  1. Viral URI with cough        Disposition  ED Disposition     ED Disposition Condition Date/Time Comment    Discharge  Sun Nov 28, 2019  9:43 AM [facility] [place] discharge to home/self care.    Condition at disposition: Stable          Vital signs were reviewed at the time of disposition.  Patient Vitals for the past 24 hrs:   BP Temp Temp src Pulse Resp SpO2 Height Weight   11/28/19 0816 109/61 98.2 F (36.8 C) Oral 68 18 97 % 1.676 m 104.3 kg          Prescriptions  New Prescriptions    No medications on file          Follow-up Information     Duffy Rhody, MD.    Specialty: Family Medicine  Contact information:  [address] [facility]  [place] [address]  [phone]                             SIGNED BY: Minette Brine II, MD        This chart was generated by an EMR and may contain errors, including typographical, or omissions not intended by the user.             Alger Simons, MD  11/28/19 [phone]

## 2019-11-28 NOTE — ED Triage Notes (Signed)
SEE QUICK TRIAGE

## 2020-03-01 IMAGING — US US OB COMP LESS 14 WK
1 series · 14 of 28 positions shown · non-contrast
Comparison: None.

CLINICAL DATA: Left pelvic pain.  Positive beta HCG.

EXAM:
OBSTETRIC <14 WK US AND TRANSVAGINAL OB US
TECHNIQUE: Both transabdominal and transvaginal ultrasound examinations were
performed for complete evaluation of the gestation as well as the
maternal uterus, adnexal regions, and pelvic cul-de-sac.
Transvaginal technique was performed to assess early pregnancy.

[Series 1: us ob comp less 14 wk · 0.22mm/px · 61 acquisitions, 14 frames shown]
[im 3/61]
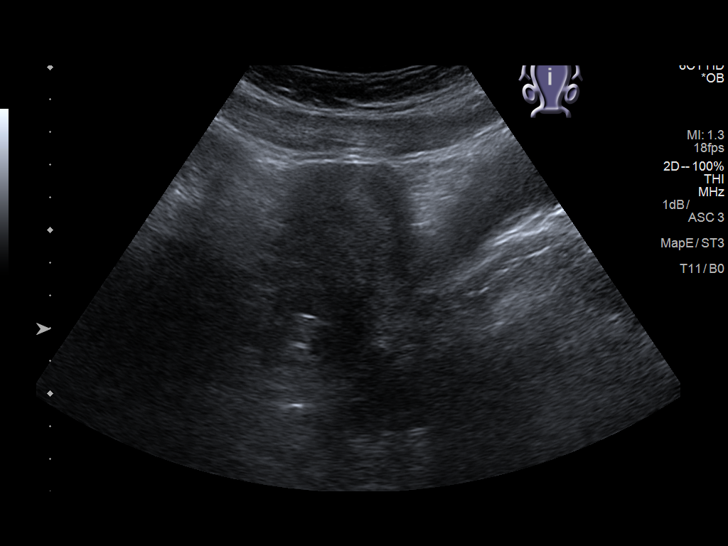
[im 7/61]
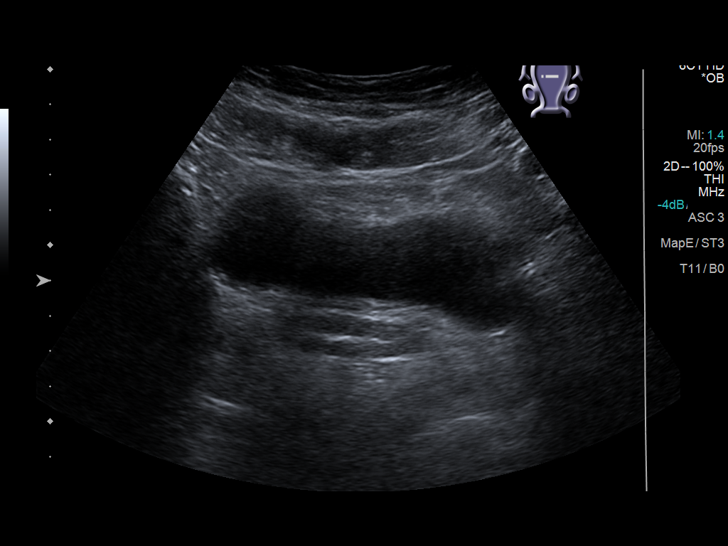
[im 12/61]
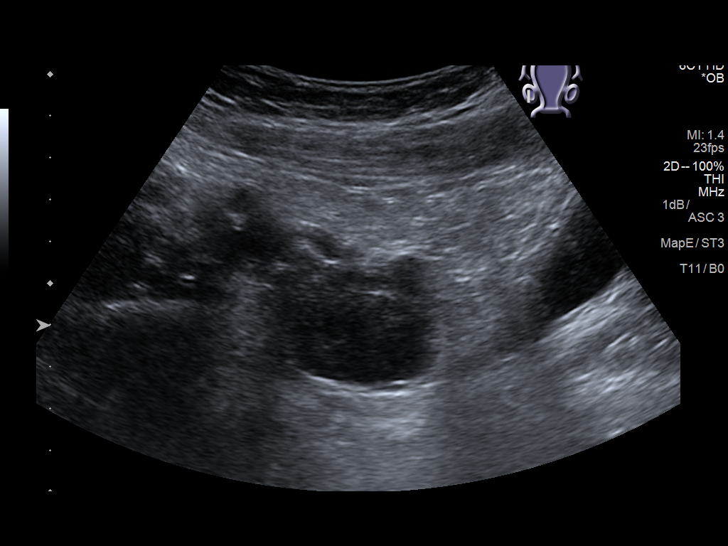
[im 16/61]
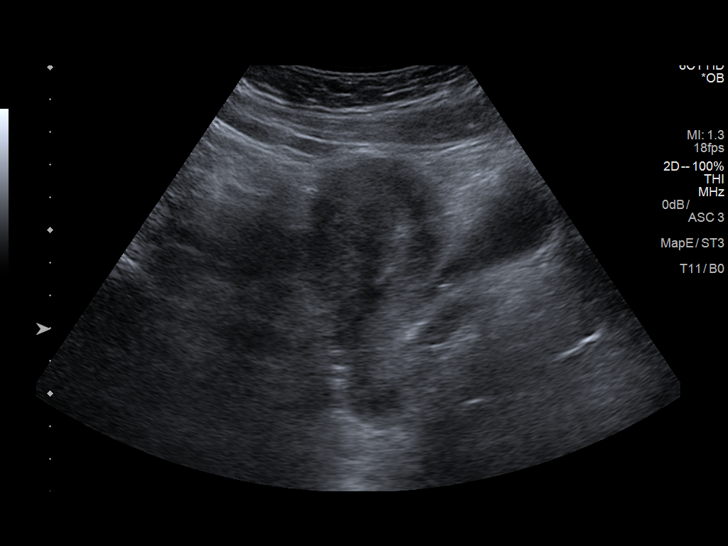
[im 21/61]
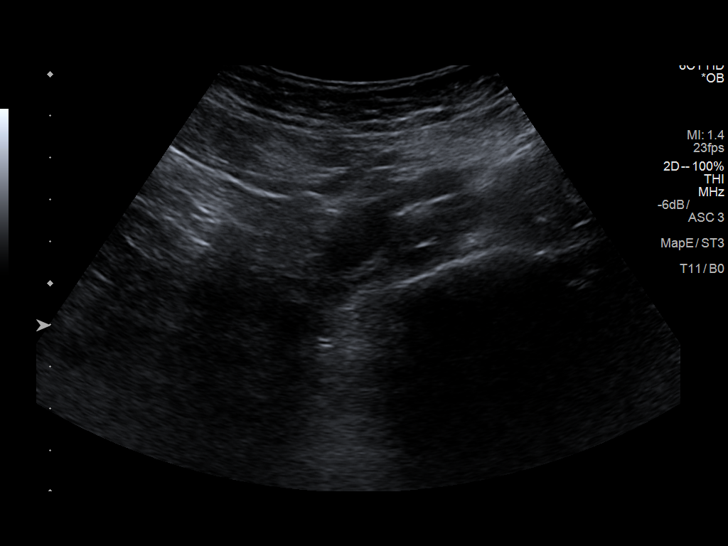
[im 25/61]
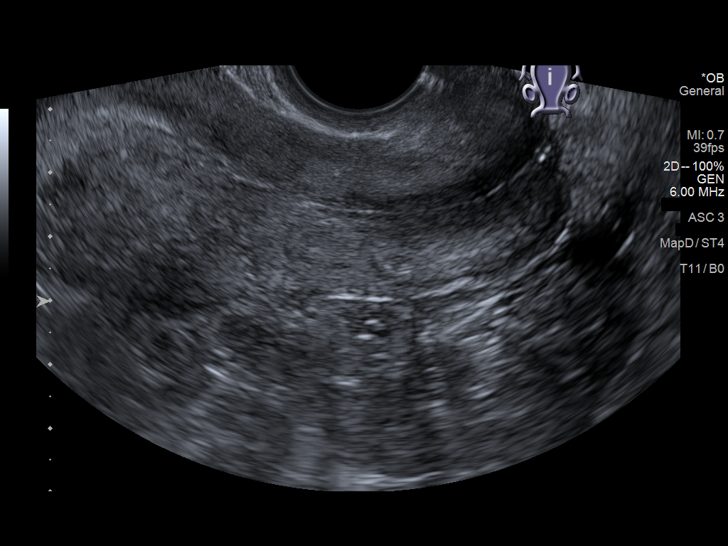
[im 29/61]
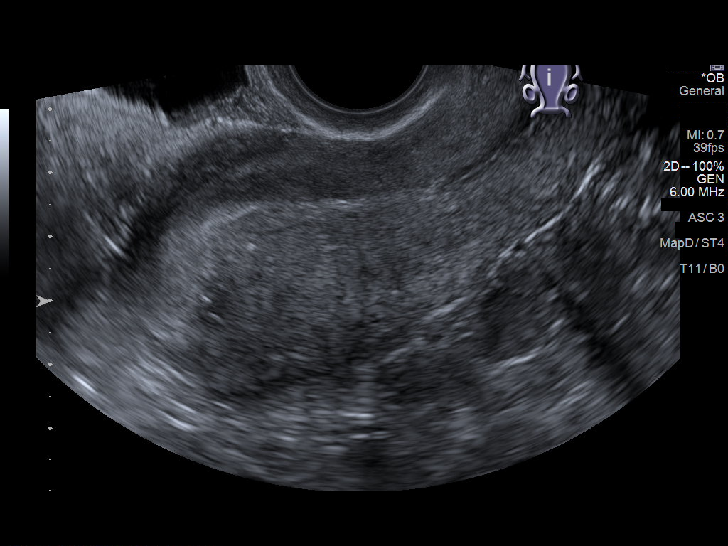
[im 34/61]
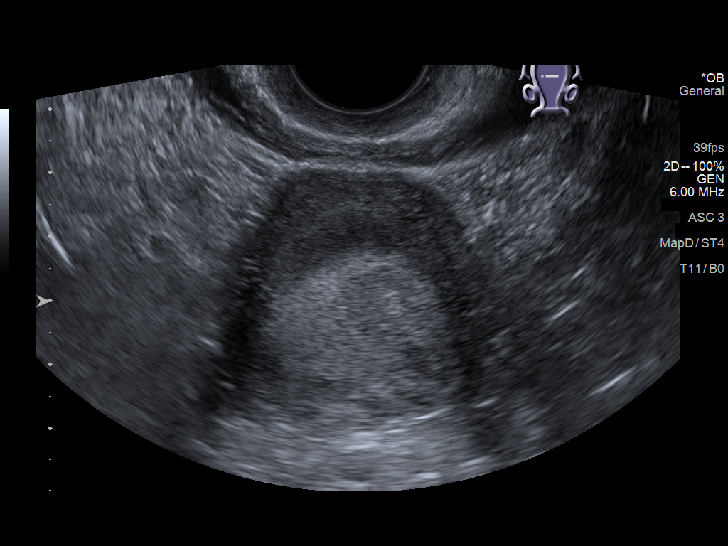
[im 38/61]
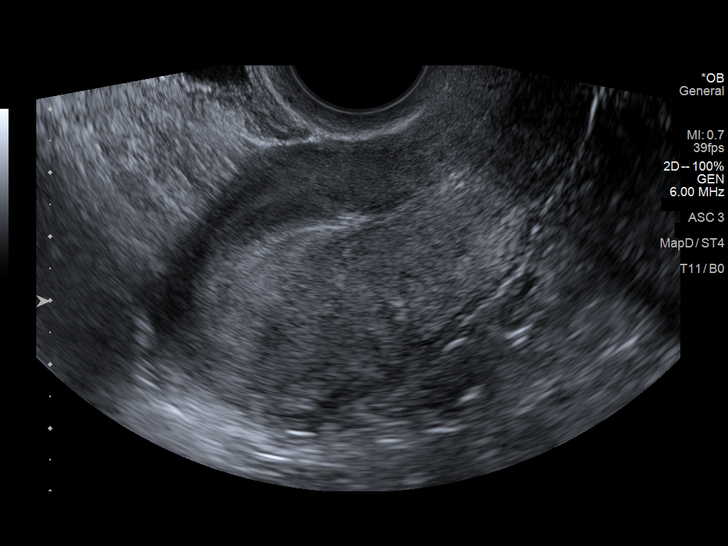
[im 43/61]
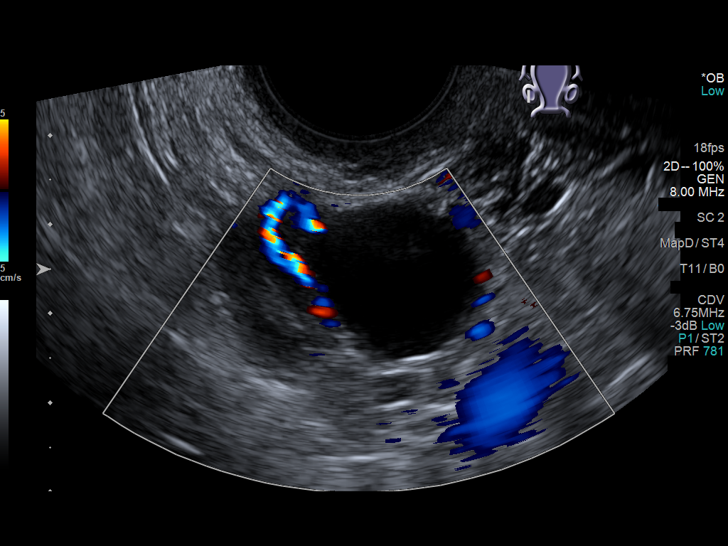
[im 47/61]
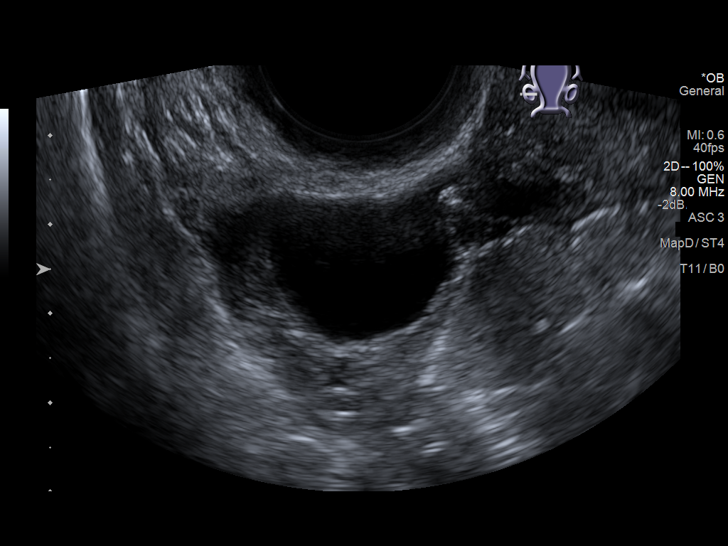
[im 52/61]
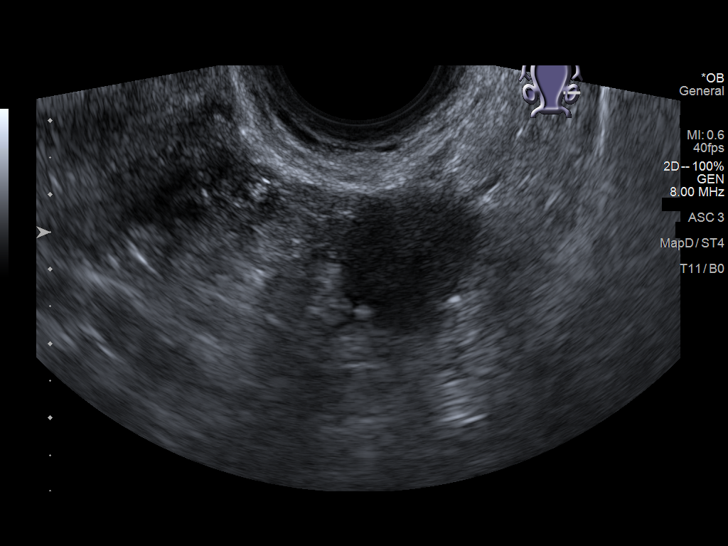
[im 56/61]
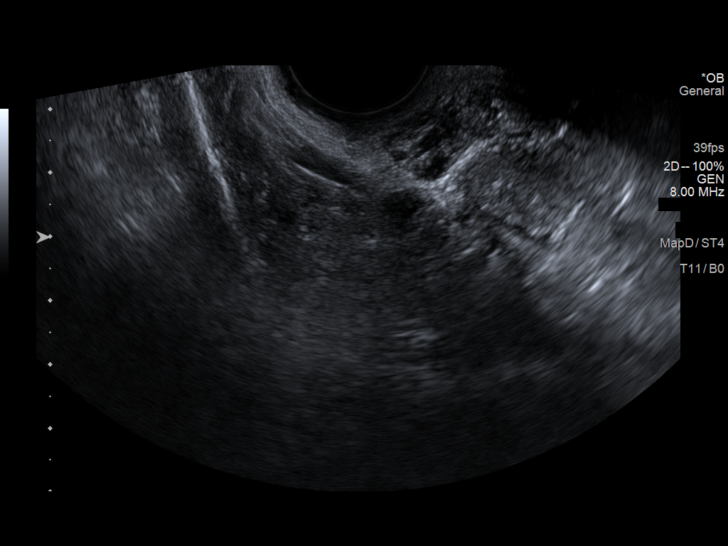
[im 61/61]
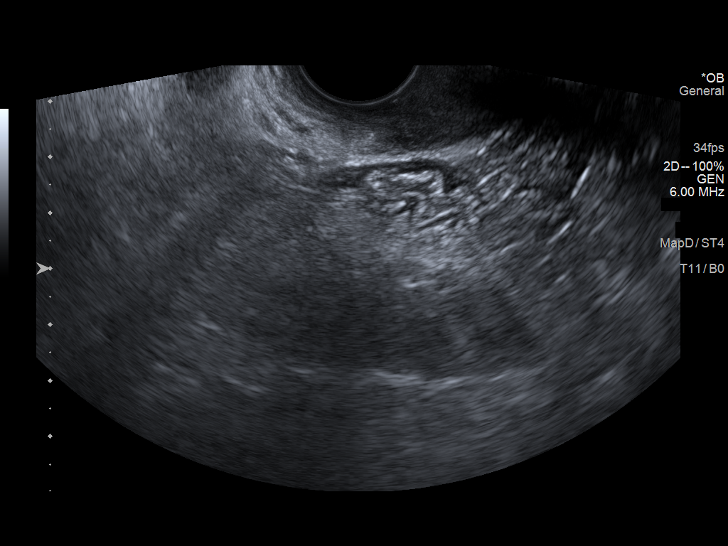

[14 of 28 positions shown; findings below may reference images not displayed]

FINDINGS: Intrauterine gestational sac: None

Yolk sac:  Not Visualized.

Embryo:  Not Visualized.

Cardiac Activity: Not Visualized.

Maternal uterus/adnexae: Dominant follicle in the right ovary.
Normal appearance of the uterus and left ovary.
IMPRESSION: No IUP is visualized.

By definition, in the setting of a positive pregnancy test, this
reflects a pregnancy of unknown location. Differential
considerations include early normal IUP, abnormal IUP/missed
abortion, or nonvisualized ectopic pregnancy.

Serial beta HCG is suggested. Consider repeat pelvic ultrasound in
14 days.

## 2020-08-31 ENCOUNTER — Ambulatory Visit: Payer: Medicaid Managed Care Other | Attending: Nurse Practitioner | Admitting: Nurse Practitioner

## 2020-08-31 ENCOUNTER — Encounter (RURAL_HEALTH_CENTER): Payer: Self-pay

## 2020-08-31 VITALS — BP 120/60 | HR 85 | Temp 97.2°F

## 2020-08-31 DIAGNOSIS — M79674 Pain in right toe(s): Secondary | ICD-10-CM

## 2020-08-31 NOTE — Progress Notes (Signed)
Demographics:     Date Time: 08/31/2020 4:51 PM  Patient Name: Taylor Wells, Taylor Wells  Date of Birth: 01/11/1991  Age: 30 y.o.   Primary Care Provider: No primary care provider on file.    Chief Complaint:     Chief Complaint   Patient presents with    Toe Pain     Patient tripped 2 days ago and stubbed right toe again wall, she states she can not apply pressure to her whole foot while walking. Redness, no swelling, patient states that it feels like her toe is going to fall off     History of Presenting Illness:     Taylor Wells is a 30 y.o. female that presents today with a history of The encounter diagnosis was Great toe pain, right.    Toe Pain   The incident occurred 2 days ago. The injury mechanism was a direct blow. The pain is present in the right toes. The quality of the pain is described as aching. She reports no foreign bodies present. The symptoms are aggravated by movement, weight bearing and palpation. She has tried ice for the symptoms. The treatment provided mild relief.     The patient reports a pain level of  Pain Score: 6-moderate pain out of 10.    The following portions of the patient's history were reviewed and updated as appropriate: allergies, current medications, past family history, past medical history, past social history, past surgical history, and problem list.    For the duration of this visit, I wore appropriate PPE to include a KN95 covering both the nose and mouth and VH provided goggles, and the patient wore appropriate PPE to include cloth face mask over the nose and mouth.      Medical History     Past Medical History:   Diagnosis Date    Depression     Gastroesophageal reflux disease     Sleep apnea     used bipap in past       Past Surgical History:   Procedure Laterality Date    CHOLECYSTECTOMY      HERNIA REPAIR        (Not in a hospital admission)    Allergies   Allergen Reactions    Milk-Related Compounds Anaphylaxis    Latex Hives and Rash    Ativan [Lorazepam]       PANIC    Promethazine      Panic attack    Metoclopramide Other (See Comments)     Panic attack    Penicillins Rash      Social History     Tobacco Use    Smoking status: Every Day     Packs/day: 0.50     Years: 13.00     Pack years: 6.50     Types: Cigarettes    Smokeless tobacco: Never   Substance Use Topics    Alcohol use: Not Currently      Family History   Problem Relation Age of Onset    Rheum arthritis Sister     Lupus Sister     Schizophrenia Mother     COPD Mother     No known problems Father         Review of Systems:      Review of Systems   Musculoskeletal:         Right great toe pain      Objective:     Vitals:    08/31/20 1621   BP: 120/60  Pulse: 85   Temp: 97.2 F (36.2 C)   TempSrc: Tympanic   SpO2: 97%     There is no height or weight on file to calculate BMI.  Physical Exam  Vitals reviewed.   HENT:      Head: Normocephalic.   Cardiovascular:      Rate and Rhythm: Normal rate.   Pulmonary:      Effort: Pulmonary effort is normal.   Musculoskeletal:        Feet:    Skin:     General: Skin is warm and dry.   Neurological:      Mental Status: She is alert and oriented to person, place, and time.   Psychiatric:         Behavior: Behavior normal.       Hearing/Vision Exam:     No results found.       Image Review:     .No results found.    Lab Review:     Results       None            Assessment:     1. Great toe pain, right        Plan:     The patient was counseled on treating pain with NSAID and Tylenol. Rest, ice and elevation for swelling and pain. Patient was placed in a Post Op shoe to wear until fracture is ruled out or until seeing orthopedics if there is a fracture.    At the conclusion of the visit we reviewed diagnosis, treatment plan, diagnostic tests, prescriptions and follow up instructions pertaining to this visit. All patient questions and concerns regarding The encounter diagnosis was Great toe pain, right. were addressed. Results of any diagnostic test ordered today are listed  above under lab review or image review if applicable.     Orders as below:    1. Great toe pain, right  - XR Toe Right 2 + Vw; Future      Follow up plan:  No follow-ups on file.  Advised to RTC sooner with any new or worsening symptoms.  Patient verbalized understanding and agreement to plan.     Orders/Procedures:       Orders Placed This Encounter   Procedures    XR Toe Right 2 + Vw     Standing Status:   Future     Standing Expiration Date:   08/31/2021     Order Specific Question:   Is the patient pregnant?     Answer:   No     Order Specific Question:   Reason for Exam:     Answer:   right great toe pain after injury     Order Specific Question:   Release to patient     Answer:   Immediate      - MAR ACTION REPORT  (last 24 hrs)           ** No medications to display **          New Prescriptions    No medications on file     Modified Medications    No medications on file       Signed electronically by  Montez Morita, NP 08/31/2020, 4:51 PM

## 2020-09-14 ENCOUNTER — Emergency Department: Payer: Medicaid Managed Care Other

## 2020-09-14 ENCOUNTER — Emergency Department
Admission: EM | Admit: 2020-09-14 | Discharge: 2020-09-14 | Disposition: A | Discharge status: Home / Self Care | Payer: Medicaid Managed Care Other | Attending: Nurse Practitioner | Admitting: Nurse Practitioner

## 2020-09-14 DIAGNOSIS — S92405A Nondisplaced unspecified fracture of left great toe, initial encounter for closed fracture: Secondary | ICD-10-CM

## 2020-09-14 DIAGNOSIS — X58XXXA Exposure to other specified factors, initial encounter: Secondary | ICD-10-CM | POA: Insufficient documentation

## 2020-09-14 DIAGNOSIS — S92404A Nondisplaced unspecified fracture of right great toe, initial encounter for closed fracture: Secondary | ICD-10-CM | POA: Insufficient documentation

## 2020-09-14 NOTE — ED Provider Notes (Signed)
[facility]  EMERGENCY DEPARTMENT  History and Physical Exam       Patient Name: Taylor Wells, Taylor Wells [facility]  Encounter Date:  09/14/2020  Nurse Practitioner: Graciela Husbands, FNP  Attending Physician: Francetta Found, DO  PCP: Marisa Sprinkles, MD  Patient DOB:  [birth year]  MRN:  [id number]  Room:  E1/ED1-A      History of Presenting Illness     Chief complaint: Foot Pain    HPI/ROS given by: Patient,    Taylor Wells is a 30 y.o. female who presents with right foot great toe joint swelling, tenderness, mild warmth over the past 2 weeks.     Denies any known history of gout. She denies any known traumatic injury.        Review of Systems     Review of Systems   Constitutional:  Negative for chills, fatigue and fever.   HENT:  Negative for congestion.    Respiratory:  Negative for cough.    Musculoskeletal:  Positive for arthralgias.   Skin:  Positive for wound.      Allergies & Medications     Pt is allergic to milk-related compounds, latex, ativan [lorazepam], promethazine, metoclopramide, and penicillins.    Current/Home Medications    ABILIFY MAINTENA ER INJECTION        BUPROPION XL (WELLBUTRIN XL) 150 MG 24 HR TABLET        MULTIPLE VITAMINS-MINERALS (MULTIVITAMIN WITH MINERALS) TABLET    Take 1 tablet by mouth daily        Past Medical History     Pt has a past medical history of Depression, Gastroesophageal reflux disease, and Sleep apnea.     Past Surgical History     Pt  has a past surgical history that includes Hernia repair and Cholecystectomy.     Family History     The family history includes COPD in her mother; Lupus in her sister; No known problems in her father; Rheum arthritis in her sister; Schizophrenia in her mother.     Social History     Pt reports that she has been smoking cigarettes. She has a 6.50 pack-year smoking history. She has never used smokeless tobacco. She reports that she does not currently use alcohol. She reports current drug use. Drug: Marijuana.      Physical Exam     Vitals:    09/14/20 1312   BP: 117/71   Pulse: 68   Resp: 20   Temp: 98.2 F (36.8 C)   SpO2: 98%   Weight: 108.9 kg   Height: 1.702 m         Physical Exam  Vitals and nursing note reviewed.   Constitutional:       Appearance: Normal appearance.   HENT:      Head: Normocephalic and atraumatic.      Nose: Nose normal.      Mouth/Throat:      Mouth: Mucous membranes are moist.   Eyes:      Pupils: Pupils are equal, round, and reactive to light.   Cardiovascular:      Rate and Rhythm: Normal rate.      Pulses: Normal pulses.   Pulmonary:      Effort: Pulmonary effort is normal.   Abdominal:      General: Abdomen is flat.   Musculoskeletal:         General: Swelling and tenderness present. Normal range of motion.      Cervical back: Normal  range of motion.        Feet:    Skin:     General: Skin is warm.      Capillary Refill: Capillary refill takes less than 2 seconds.   Neurological:      Mental Status: She is alert and oriented to person, place, and time. Mental status is at baseline.   Psychiatric:         Mood and Affect: Mood normal.         Judgment: Judgment normal.          Diagnostic Results     The results of the diagnostic studies below have been reviewed by myself:    Labs  Results       ** No results found for the last 24 hours. **            Radiologic Studies  XR Foot Right AP Lateral And Oblique    Result Date: 09/14/2020  Comminuted possible stress fracture of the medial sesamoid great toe. ReadingStation:[id number]          ED Course and Medical Decision Making     ED Medication Orders (From admission, onward)      None             This patient presents following injury to an extremity.   Evaluation and treatment for this patient was performed and revealed that there were no immediate threat of loss of limb.  Sequelae of their injury were considered in the differential diagnosis including sprain, fracture, dislocation, contusion, abrasion, and laceration. Any serious sequelae  that would require admission and immediate surgical repair were thought unlikely, and the patient is stable for discharge home.  The diagnostic impression and plan and appropriate follow-up were discussed and agreed upon with the patient and/or family.  If performed the results of lab/radiology tests were reviewed and discussed. All questions were answered and concerns addressed. The patient was warned to return immediately for worsening symptoms or any acute concerns and to follow up with an orthopaedic specialist within an appropriate time as discussed.      In addition to the above history, please see nursing notes. Allergies, meds, past medical, family, social hx, and the results of the diagnostic studies performed have been reviewed by myself.    I discussed this case with the attending physician in the emergency department and they agree with the assessment and treatment plan.     This chart was generated by an EMR and may contain errors or omissions not intended by the user.     Procedures / Critical Care     None     Diagnosis / Disposition     Clinical Impression  1. Closed nondisplaced fracture of phalanx of left great toe, unspecified phalanx, initial encounter        Disposition  ED Disposition       ED Disposition   Discharge    Condition   --    Date/Time   Thu Sep 14, 2020  2:13 PM    Comment   Taylor Wells discharge to home/self care.    Condition at disposition: Stable               Discharge Instructions Include:    Xray shows stress fracture of the left great toe.   Tylenol and Motrin for pain.   Rest, Ice, Elevate.   Post Op Shoe splint provided.   If no improvement in the upcoming weeks,  follow up with orthopedics, contact information provided.     Follow up for Discharged Patients  Gardiner Barefoot, MD  [address] [place]  [address]  [place] [address]  [phone]    Call in 1 week  If symptoms worsen, As needed    [facility] Emergency Department  [address]  [place]  [place] [address]  [phone]    As needed, If symptoms worsen    Prescriptions for Discharged Patients  New Prescriptions    No medications on file          Signed by:  Graciela Husbands, FNP, DNP       Ulice Dash, FNP  09/14/20 1415

## 2020-09-14 NOTE — Discharge Instructions (Addendum)
Discharge Instructions Include:    Taylor Wells shows stress fracture of the left great toe.   Tylenol and Motrin for pain.   Rest, Ice, Elevate.   Post Op Shoe splint provided.   If no improvement in the upcoming weeks, follow up with orthopedics, contact information provided.

## 2020-09-14 NOTE — ED Triage Notes (Signed)
"  I think I'm having a gout flare".  Patient complains of pain and redness in great toe of right foot beginning two weeks ago.

## 2020-09-14 NOTE — ED Notes (Signed)
This patient presented to the emergency room today and was seen by the midlevel provider Graciela Husbands.  We discussed and agreed upon the patient's management and treatment plan.     Francetta Found, DO  09/14/20 1417

## 2021-04-03 ENCOUNTER — Ambulatory Visit (RURAL_HEALTH_CENTER): Payer: Medicaid Managed Care Other | Admitting: Obstetrics & Gynecology

## 2021-07-10 ENCOUNTER — Encounter (RURAL_HEALTH_CENTER): Payer: Self-pay | Admitting: Obstetrics & Gynecology

## 2021-07-10 ENCOUNTER — Ambulatory Visit: Payer: Medicaid Managed Care Other | Attending: Obstetrics & Gynecology | Admitting: Obstetrics & Gynecology

## 2021-07-10 ENCOUNTER — Other Ambulatory Visit
Admission: RE | Admit: 2021-07-10 | Discharge: 2021-07-10 | Disposition: A | Discharge status: Home / Self Care | Payer: Medicaid Managed Care Other | Source: Ambulatory Visit | Attending: Obstetrics & Gynecology | Admitting: Obstetrics & Gynecology

## 2021-07-10 VITALS — Temp 97.5°F | Ht 67.0 in | Wt 248.2 lb

## 2021-07-10 DIAGNOSIS — Z124 Encounter for screening for malignant neoplasm of cervix: Secondary | ICD-10-CM

## 2021-07-10 NOTE — Progress Notes (Signed)
Subjective:    Patient ID: Kelin Nixon is a 31 y.o. female.    HPI 31 year old patient here to establish care and for Pap smear.  She had a Mirena placed 3 years ago and was under the impression she needed to switch now.    The following portions of the patient's history were reviewed and updated as appropriate: allergies, current medications, past family history, past medical history, past social history, past surgical history, and problem list.    Review of Systems   Constitutional: Negative.    Gastrointestinal: Negative.    Genitourinary: Negative.    Psychiatric/Behavioral: Negative.             Objective:    Physical Exam  Vitals and nursing note reviewed. Exam conducted with a chaperone present.   Constitutional:       Appearance: Normal appearance. She is obese.   Pulmonary:      Effort: Pulmonary effort is normal.   Genitourinary:     Labia:         Right: No rash, tenderness, lesion or injury.         Left: No rash, tenderness, lesion or injury.       Vagina: No signs of injury and foreign body. No vaginal discharge, tenderness or lesions.      Cervix: Normal.         Neurological:      Mental Status: She is alert.   Psychiatric:         Mood and Affect: Mood normal.             Assessment:       1. Cervical cancer screening          Plan:       Reviewed how long Mirena IUD can last.  Pap smear done, will email results.  All questions answered.

## 2021-07-13 LAB — VH HPV HIGH RISK SCREEN (TMA): HPV High Risk: NOT DETECTED

## 2021-07-13 LAB — VH PAP TEST THIN PREP: Pap Test Thin Prep: NEGATIVE

## 2021-08-23 ENCOUNTER — Encounter (RURAL_HEALTH_CENTER): Payer: Self-pay | Admitting: Medical

## 2021-08-23 ENCOUNTER — Other Ambulatory Visit
Admission: RE | Admit: 2021-08-23 | Discharge: 2021-08-23 | Disposition: A | Discharge status: Home / Self Care | Payer: Medicaid Managed Care Other | Source: Ambulatory Visit | Attending: Medical | Admitting: Medical

## 2021-08-23 ENCOUNTER — Ambulatory Visit: Payer: Medicaid Managed Care Other | Attending: Medical | Admitting: Medical

## 2021-08-23 VITALS — BP 120/78 | HR 98 | Temp 97.5°F | Resp 17 | Ht 68.0 in | Wt 247.0 lb

## 2021-08-23 DIAGNOSIS — F319 Bipolar disorder, unspecified: Secondary | ICD-10-CM

## 2021-08-23 DIAGNOSIS — R5382 Chronic fatigue, unspecified: Secondary | ICD-10-CM

## 2021-08-23 DIAGNOSIS — Z0000 Encounter for general adult medical examination without abnormal findings: Secondary | ICD-10-CM

## 2021-08-23 DIAGNOSIS — F4310 Post-traumatic stress disorder, unspecified: Secondary | ICD-10-CM

## 2021-08-23 DIAGNOSIS — E010 Iodine-deficiency related diffuse (endemic) goiter: Secondary | ICD-10-CM

## 2021-08-23 DIAGNOSIS — Z803 Family history of malignant neoplasm of breast: Secondary | ICD-10-CM

## 2021-08-23 DIAGNOSIS — G4733 Obstructive sleep apnea (adult) (pediatric): Secondary | ICD-10-CM

## 2021-08-23 DIAGNOSIS — R400 Somnolence: Secondary | ICD-10-CM

## 2021-08-23 DIAGNOSIS — F17210 Nicotine dependence, cigarettes, uncomplicated: Secondary | ICD-10-CM

## 2021-08-23 LAB — HEMOGLOBIN A1C
Estimated Average Glucose: 105 mg/dL
Hgb A1C, %: 5.3 %

## 2021-08-23 MED ORDER — NICOTINE 21 MG/24HR TD PT24
1.0000 | MEDICATED_PATCH | TRANSDERMAL | 1 refills | Status: DC
Start: 2021-08-23 — End: 2021-10-09

## 2021-08-23 NOTE — Progress Notes (Signed)
[facility]  [address] [place], [address]  [phone]         New Patient Office Note    Date Time: 08/23/2021 3:05 PM  Patient Name: Taylor Wells, Taylor Wells  DOB: 1990-11-29                                                                Subjective   History was provided by the  patient    HPI: Patient is a 31 y.o. female who presents today to establish care.   Previous PCP: Dr. Ferd Hibbs. Last visit approx: 2021. Reason for transfer of care: Prev PCP no longer at the practice.  Specialists: GYN Bradly Bienenstock). Psychiatry ([facility] - Dr. Mercie Eon - treats bipolar depression, anxiety, and PTSD), therapy ([facility]), dentist Scientist, research (medical))     Questions/Concerns today:     Thyroid screening:  Her psychiatrist suggested she get her thyroid evaluated because she has been experiencing chronic fatigue despite her treatment for depression otherwise being effective. She is on Abilify and Calpyta. She is not sleeping well. She can wake up in the middle of the night for a couple hours. Usually a total of 3 hours sleep during the night and 1 nap during the day up to 2 hours. She does snore and mentions she was dx with OSA at age 56 while in [place] but lost her CPAP when she lost her insurance coverage.   Her chart is suggestive of a gradual weight gain over several years  She has chronic constipation with a bowel movement every 3 days, sometimes having to take OTC laxatives.   Occasionally she feels like her voice is hoarse or throat feels "tight" when swallowing.  There is a strong FH of hypothyroidism including her mother, sister, maternal aunt, and grandmother.     Wants to quit smoking.    Health Maintenance:  Last Labs: 2021 in ED  OB/GYN: Bradly Bienenstock  Last WWE: 2022  Last PAP: 2022, normal  Gravida: 4  Para: 2  No LMP recorded. (Menstrual status: IUD).  Mammogram: N/A,  age(notably she had 2 aunts who were diagnosed with breast cancer in their 30s and died. However her mother has had normal screening mammography)  Colonoscopy: N/A, age  Bone density scan: N/A, age  Tdap: Due (2020)  Flu:  Not applicable  Shingles Vaccine: Not applicable  COVID Vaccines: Had primary COVID vaccine series at [facility]     Sleep: does have trouble falling asleep staying asleep, average of 3 hours nightly.     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 Safe and comfortable environment.   Tobacco: 1ppd since age 13. Tried Chantix- had a bad rash.   Alcohol: None  Illegal drug use: marijuana, mostly edibles    Desires screening for STIs: no    History:  Past Medical History:   Diagnosis Date    Depression     Gastroesophageal reflux disease     Sleep apnea     used bipap in past      Past Surgical History:   Procedure Laterality Date    CHOLECYSTECTOMY  2014    HAND SURGERY Right     2007, 2009 - damage from frostbite  HERNIA REPAIR  2010    umbilical     Family History   Problem Relation Age of Onset    Thyroid disease Mother         hx of, now euthyroid    Schizophrenia Mother     COPD Mother     No known problems Father     Thyroid disease Sister     Rheum arthritis Sister     Lupus Sister     Thyroid disease Maternal Grandmother     Skin cancer Maternal Grandmother     Lung cancer Maternal Grandfather     No known problems Daughter     No known problems Son     Thyroid disease Maternal Aunt     Breast cancer Maternal Aunt 30 - 39    Breast cancer Maternal Aunt 30 - 39    Breast cancer Paternal Aunt      Social History     Social History Narrative    Not on file     Allergies   Allergen Reactions    Milk-Related Compounds Anaphylaxis    Latex Hives and Rash    Ativan [Lorazepam]      PANIC    Promethazine      Panic attack    Metoclopramide Other (See Comments)     Panic attack    Penicillins Rash     Outpatient Medications Marked as Taking  for the 08/23/21 encounter (Office Visit) with Juel Burrow, PA   Medication Sig Dispense Refill    Abilify Maintena ER injection       Multiple Vitamins-Minerals (multivitamin with minerals) tablet Take 1 tablet by mouth daily      propranolol (INDERAL) 10 MG tablet Take 1 tablet (10 mg) by mouth 3 (three) times daily         Review of Systems   Constitutional:  Positive for appetite change (chronically low, due to depression), fatigue and unexpected weight change (gradual gain). Negative for diaphoresis and fever.   HENT:  Positive for trouble swallowing and voice change. Negative for dental problem.    Eyes:  Negative for visual disturbance.   Respiratory:  Negative for cough and shortness of breath.         Positive for snoring   Cardiovascular:  Negative for chest pain, palpitations and leg swelling.   Gastrointestinal:  Positive for constipation. Negative for abdominal pain, diarrhea, nausea and vomiting.   Endocrine: Negative for cold intolerance and heat intolerance.   Genitourinary:  Negative for difficulty urinating and menstrual problem.   Musculoskeletal:  Negative for arthralgias, joint swelling and myalgias.   Allergic/Immunologic: Positive for food allergies (milk).   Neurological:  Negative for dizziness, tremors, seizures, weakness, light-headedness, numbness and headaches.   Hematological:  Negative for adenopathy. Does not bruise/bleed easily.   Psychiatric/Behavioral:  Positive for dysphoric mood and sleep disturbance. The patient is nervous/anxious.                                                                    Objective   BP 120/78   Pulse 98   Temp 97.5 F (36.4 C)   Resp 17   Ht 1.727 m (5\' 8" )   Wt 112 kg (247 lb)  SpO2 99%   BMI 37.56 kg/m     Vision Screening    Right eye Left eye Both eyes   Without correction 20/15 20/20 20/20    With correction        Physical Exam  Vitals reviewed.   Constitutional:       General: She is not in acute distress.     Appearance: Normal  appearance.   HENT:      Head: Normocephalic and atraumatic.      Right Ear: Tympanic membrane, ear canal and external ear normal.      Left Ear: Tympanic membrane, ear canal and external ear normal.      Nose: Nose normal.      Mouth/Throat:      Mouth: Mucous membranes are moist.      Pharynx: No posterior oropharyngeal erythema.   Eyes:      Extraocular Movements: Extraocular movements intact.      Conjunctiva/sclera: Conjunctivae normal.      Pupils: Pupils are equal, round, and reactive to light.   Neck:      Thyroid: Thyromegaly present. No thyroid mass or thyroid tenderness.   Cardiovascular:      Rate and Rhythm: Normal rate and regular rhythm.      Pulses: Normal pulses.      Heart sounds: No murmur heard.     No gallop.   Pulmonary:      Effort: Pulmonary effort is normal.      Breath sounds: Normal breath sounds.   Abdominal:      General: Bowel sounds are normal. There is no distension.      Palpations: Abdomen is soft.      Tenderness: There is no abdominal tenderness.   Musculoskeletal:         General: No swelling or deformity. Normal range of motion.      Cervical back: Neck supple.   Lymphadenopathy:      Cervical: No cervical adenopathy.   Skin:     General: Skin is warm and dry.      Findings: No rash.   Neurological:      General: No focal deficit present.      Mental Status: She is alert and oriented to person, place, and time.   Psychiatric:         Mood and Affect: Mood normal.         Behavior: Behavior normal.         Labs:  Lab Results   Component Value Date    NA 139 07/11/2019    K 4.1 07/11/2019    BUN 18 07/11/2019    GLU 99 07/11/2019    CA 9.7 07/11/2019    AST 9 (L) 07/11/2019    ALT 12 07/11/2019    ALKPHOS 65 07/11/2019       Immunizations:  Immunization History   Administered Date(s) Administered    Hep A / Hep B 03/27/2010, 04/27/2010, 09/25/2010    INFLUENZA QUAD (FLULAVAL ONLY) 6 MOS & GREATER PRESERVED 11/06/2017    Meningococcal Conjugate 09/25/2010    Tdap 03/05/2018                                                              Assessment and Plan:   1. Annual  physical exam  - TSH, Abn Reflex to Free T4, Serum; Future  - Vitamin D,25 OH, Total; Future  - Vitamin B12; Future  - Hemoglobin A1C; Future  - Lipid panel; Future  - Comprehensive metabolic panel; Future    Visual acuity reviewed, no concerns  Dental care UTD  Pap UTD  Immunizations UTD except Pneumovax, will offer at future visit    2. Chronic fatigue  - TSH, Abn Reflex to Free T4, Serum; Future  - Vitamin D,25 OH, Total; Future  - Vitamin B12; Future  - Hemoglobin A1C; Future  - Lipid panel; Future  - Comprehensive metabolic panel; Future  - Ambulatory referral to Sleep Studies; Future    Agree she does have some symptoms of hypothyroidism and possibly mild enlarged thyroid on exam.  Will check her TSH. Consider a thyroid ultrasound  At the same time she has known obstructive sleep apnea that is untreated and since it has been several years since her initial sleep study I expect she will need to repeat this. Referral placed to [facility] sleep clinic.     3. Daytime somnolence  - Ambulatory referral to Sleep Studies; Future  4. Obstructive sleep apnea  - Ambulatory referral to Sleep Studies; Future    See #2    5. Bipolar 1 disorder  6. PTSD (post-traumatic stress disorder)    Followed by psychiatry    7. Nicotine dependence, cigarettes, uncomplicated  - nicotine (NICODERM CQ) 21 MG/24HR; Place 1 patch onto the skin every 24 hours  Dispense: 28 patch; Refill: 1  Smoking Cessation Counseling:  Date: August 23, 2021  READINESS TO QUIT: Patient is strongly motivated to quit smoking. She is concerned about her health especially after seeing her mother struggle with COPD.  PRIOR ATTEMPTS: Patient tried Varenicline (Chantix), but developed a rash and had to stop. She has been on Wellbutrin for her depression, and it wasn't effective.  RELEVANCE: Patient counseled on how quitting may be personally relevant to the following topics:  decreased risk of cardiovascular and pulmonary disease, decreased risk of health complications and malignancies, reducing the risk of health problems/complications in children, and modeling a good example for others  ROADBLOCKS: Patient counseled on the potential roadblocks to quitting: surrounded by people who smoke (ie. spouse, coworkers, and/or friends)  RECOMMENDATIONS: gradually reduce the number of cigarettes smoked per day, identify known triggers and avoid those situations, removing cigarettes from the environment, enlist the support of family and friends, start nicotine replacement therapy, counseling and management of underlying mental health conditions, download a smoking cessation app on the phone, direct to online resources for smoking cessation, and will discuss smoking cessation again at next appointment    Total time spent counseling: 3 to 10 minutes       8. Family history of breast cancer  She reports 2 maternal aunts who developed breast cancer in their 30s and died as well as a paternal aunt who is a survivor s/p bilateral mastectomy. Her mother, who is also my patient, has had normal mammograms. The Tyrer-Cuzick risk evaluation estimates her at a 1.2% 10-year risk of breast cancer (average 0.6% at this age) and a 20.1% lifetime risk (11.2% average). The Dondra Spry model estimates her at 10.1% lifetime risk assuming that she does not have a genetic syndrome. We may wish to consider referral to a breast clinic for a risk assessment to determine if she needs to undergo genetic testing or early screening based on her family history.  Return pending test results.       Number and Complexity of Problems Addressed  1 or more chronic illness with exacerbation, progression, or side effects of treatment    Risk of Complications and/or Morbidity or Mortality of Patient Management  Moderate              Nicki Guadalajara, PA-C 08/23/2021 3:05 PM           This note was completed using Dragon medical speech  recognition software. Grammatical errors, random word insertions, pronoun errors, incorrect word insertion, misspellings and incomplete sentences are occasional consequences of this technology due to software limitations. If there are questions or concerns about the content of this note, or information contained within the body of this dictation, they should be addressed with the provider for clarification.

## 2021-08-24 ENCOUNTER — Telehealth: Payer: Self-pay | Admitting: Medical

## 2021-08-24 LAB — LIPID PANEL
Cholesterol: 129 mg/dL (ref 75–199)
Coronary Heart Disease Risk: 2.8
HDL: 46 mg/dL (ref 45–65)
LDL Calculated: 70 mg/dL
Triglycerides: 67 mg/dL (ref 10–150)
VLDL: 13 (ref 0–40)

## 2021-08-24 LAB — COMPREHENSIVE METABOLIC PANEL
ALT: 33 U/L (ref 0–55)
AST (SGOT): 20 U/L (ref 10–42)
Albumin/Globulin Ratio: 1.31 Ratio (ref 0.80–2.00)
Albumin: 4.2 gm/dL (ref 3.5–5.0)
Alkaline Phosphatase: 79 U/L (ref 40–145)
Anion Gap: 13.7 mMol/L (ref 7.0–18.0)
BUN / Creatinine Ratio: 14.4 Ratio (ref 10.0–30.0)
BUN: 13 mg/dL (ref 7–22)
Bilirubin, Total: 0.6 mg/dL (ref 0.1–1.2)
CO2: 23 mMol/L (ref 20–30)
Calcium: 9.6 mg/dL (ref 8.5–10.5)
Chloride: 106 mMol/L (ref 98–110)
Creatinine: 0.9 mg/dL (ref 0.60–1.20)
EGFR: 88 mL/min/{1.73_m2} (ref 60–150)
Globulin: 3.2 gm/dL (ref 2.0–4.0)
Glucose: 99 mg/dL (ref 71–99)
Osmolality Calculated: 276 mOsm/kg (ref 275–300)
Potassium: 4.7 mMol/L (ref 3.5–5.3)
Protein, Total: 7.4 gm/dL (ref 6.0–8.3)
Sodium: 138 mMol/L (ref 136–147)

## 2021-08-24 LAB — VITAMIN B12: Vitamin B-12: 487 pg/mL (ref 213–816)

## 2021-08-24 LAB — THYROID STIMULATING HORMONE (TSH), REFLEX ON ABNORMAL TO FREE T4, SERUM: TSH: 2.24 u[IU]/mL (ref 0.40–4.20)

## 2021-08-24 LAB — VITAMIN D,25 OH,TOTAL: Vitamin D 25-Hydroxy: 51.4 ng/mL (ref 30.0–80.0)

## 2021-08-24 NOTE — Addendum Note (Signed)
Addended by: Juel Burrow on: 08/24/2021 12:30 PM     Modules accepted: Orders

## 2021-09-13 ENCOUNTER — Ambulatory Visit: Payer: Medicaid Managed Care Other

## 2021-10-08 ENCOUNTER — Other Ambulatory Visit (RURAL_HEALTH_CENTER): Payer: Self-pay | Admitting: Medical

## 2021-10-08 DIAGNOSIS — F17210 Nicotine dependence, cigarettes, uncomplicated: Secondary | ICD-10-CM

## 2021-10-09 NOTE — Telephone Encounter (Signed)
Pt aware.

## 2021-10-09 NOTE — Telephone Encounter (Signed)
That's great! Typically at this point we will decrease the dosage of the patch from 21mg  of nicotine to 14mg  of nicotine daily. New Rx sent. Let me know if any issues    Ronda Rajkumar M. Vear Clock, PA-C 10/09/2021  11:02 AM

## 2021-10-09 NOTE — Telephone Encounter (Signed)
Patient says she is now smoking less then 5 cigarettes a day and is still using patch

## 2021-10-23 ENCOUNTER — Ambulatory Visit: Payer: Medicaid Managed Care Other | Attending: Medical | Admitting: Medical

## 2021-10-23 ENCOUNTER — Encounter (RURAL_HEALTH_CENTER): Payer: Self-pay | Admitting: Medical

## 2021-10-23 VITALS — BP 138/78 | HR 108 | Temp 98.3°F | Wt 256.0 lb

## 2021-10-23 DIAGNOSIS — K920 Hematemesis: Secondary | ICD-10-CM | POA: Insufficient documentation

## 2021-10-23 DIAGNOSIS — J209 Acute bronchitis, unspecified: Secondary | ICD-10-CM

## 2021-10-23 DIAGNOSIS — Z20822 Contact with and (suspected) exposure to covid-19: Secondary | ICD-10-CM

## 2021-10-23 LAB — VH AMB POCT SOFIA (TM)SARS CORONAVIRUS ANTIGEN FIA: Sofia SARS-CoV-2 Ag POCT: NEGATIVE

## 2021-10-23 MED ORDER — DOXYCYCLINE MONOHYDRATE 100 MG PO TABS
100.0000 mg | ORAL_TABLET | Freq: Two times a day (BID) | ORAL | 0 refills | Status: AC
Start: 2021-10-23 — End: 2021-10-30

## 2021-10-23 MED ORDER — PREDNISONE 20 MG PO TABS
20.0000 mg | ORAL_TABLET | Freq: Two times a day (BID) | ORAL | 0 refills | Status: AC
Start: 2021-10-23 — End: 2021-10-28

## 2021-10-23 MED ORDER — ALBUTEROL SULFATE HFA 108 (90 BASE) MCG/ACT IN AERS
2.0000 | INHALATION_SPRAY | RESPIRATORY_TRACT | 0 refills | Status: AC | PRN
Start: 2021-10-23 — End: ?

## 2021-10-23 NOTE — Progress Notes (Signed)
[facility]  [address] [place], [address]  [phone]         Office Visit Note    Date Time: 10/23/2021 4:15 PM  Patient Name: Taylor Wells, Taylor Wells  DOB: May 26, 1990                                                    Subjective     For the duration of this visit, I wore appropriate PPE to include fit-tested N95 respirator, face shield, gown, and gloves. The patient wore appropriate PPE to include cloth face mask over the nose and mouth.     HPI:  This a 31 y.o. female who presents today for   Chief Complaint   Patient presents with    Cough    Nasal Congestion     4 days    Shortness of Breath         Symptom onset: 10/18/21 (day 5)    Patient reports: Subjective fevers, dyspnea on exertion, chest tightness.  Coughing up green phlegm. Wheezing at night.  On the first day, she reports coughing so hard that she vomited what she describes as "a large amount of blood" however she has not vomited nor had any hemoptysis since then (4 days ago). Additionally she has no chest pain and no abdominal pain, and is having no difficulty eating or swallowing    Sick contacts: Mom with similar symptoms    Therapy has included: Tylenol.         ROS:  Review of Systems   Constitutional:  Positive for fatigue and fever (subjective).   HENT:  Negative for congestion and sore throat.    Respiratory:  Positive for cough, chest tightness, shortness of breath and wheezing.    Cardiovascular:  Negative for chest pain.   Gastrointestinal:  Positive for diarrhea and vomiting (1x post tussive hematemesis). Negative for blood in stool and nausea.      Allergies   Allergen Reactions    Milk-Related Compounds Anaphylaxis    Latex Hives and Rash    Ativan [Lorazepam]      PANIC    Promethazine      Panic attack    Metoclopramide Other (See Comments)     Panic attack    Penicillins Rash        Outpatient Medications Marked as Taking for the 10/23/21 encounter (Office Visit) with Juel Burrow, PA   Medication Sig Dispense Refill    Abilify Maintena ER injection       Lumateperone Tosylate (CAPLYTA PO) Take by mouth      Multiple Vitamins-Minerals (multivitamin with minerals) tablet Take 1 tablet by mouth daily      nicotine (NICODERM CQ) 14 MG/24HR Place 1 patch onto the skin every 24 hours 28 patch 1    propranolol (INDERAL) 10 MG tablet Take 1 tablet (10 mg) by mouth 3 (three) times daily       Past Medical History:   Diagnosis Date    Depression     Gastroesophageal reflux disease     Sleep apnea     used bipap in past      Social History     Tobacco Use    Smoking status: Every Day     Packs/day: 0.50     Years:  13.00     Additional pack years: 0.00     Total pack years: 6.50     Types: Cigarettes    Smokeless tobacco: Never   Substance Use Topics    Alcohol use: Not Currently                                                       Objective   BP 138/78   Pulse (!) 108   Temp 98.3 F (36.8 C)   Wt 116.1 kg (256 lb)   SpO2 97%   BMI 38.92 kg/m  Body mass index is 38.92 kg/m.    Physical Exam:  Physical Exam  Constitutional:       General: She is not in acute distress.     Appearance: Normal appearance. She is ill-appearing.   Eyes:      Conjunctiva/sclera: Conjunctivae normal.   Cardiovascular:      Rate and Rhythm: Normal rate and regular rhythm.      Heart sounds: Normal heart sounds.   Pulmonary:      Effort: Pulmonary effort is normal. No respiratory distress.      Breath sounds: Normal breath sounds. No wheezing.   Abdominal:      General: There is no distension.      Palpations: Abdomen is soft.      Tenderness: There is no abdominal tenderness.   Skin:     General: Skin is warm and dry.   Neurological:      General: No focal deficit present.      Mental Status: She is alert.   Psychiatric:         Mood and Affect: Mood normal.         Behavior: Behavior normal.         No  results found.    Results       Procedure Component Value Units Date/Time    [facility] [[id number]][[id number]] Collected: 10/23/21    Specimen: Nasal Swab COVID-19 Updated: 10/23/21 1338     [facility] SARS-CoV-2 Ag POCT Negative                                                    Assessment and Plan:   1. Acute bronchitis, unspecified organism  - doxycycline (ADOXA) 100 MG tablet; Take 1 tablet (100 mg) by mouth every 12 (twelve) hours for 7 days  Dispense: 14 tablet; Refill: 0  - predniSONE (DELTASONE) 20 MG tablet; Take 1 tablet (20 mg) by mouth 2 (two) times daily for 5 days  Dispense: 10 tablet; Refill: 0  - albuterol sulfate HFA (PROVENTIL) 108 (90 Base) MCG/ACT inhaler; Inhale 2 puffs into the lungs every 4 (four) hours as needed for Wheezing or Shortness of Breath  Dispense: 8 g; Refill: 0    2. Hematemesis without nausea  - XR Chest PA and lateral; Future    3. Suspected COVID-19 virus infection  - [facility] SARS Coronavirus Antigen FIA POCT    For her bronchitis we will start doxycycline, prednisone and albuterol which she has done well with historically. Suggested OTC antitussives. She had a single episode of post  tussive hematemesis (which she describes as a large amount though unable to quantify) which may reflect either the bronchitis itself or a small mallory weiss tear. The episode itself has now resolved and she feels well with no chest or abdominal pain, and appears well including vitals and examination indicating hemodynamic stability. I did order a CXR to rule out any free air in the chest and discussed presenting to the emergency room if this happens again.    No follow-ups on file.       Number and Complexity of Problems Addressed  1 acute, uncomplicated illness or injury    Amount and/or Complexity of Data to be Reviewed and Analyzed  2 unique tests ordered    Risk of Complications and/or Morbidity or Mortality of Patient Management  Moderate            Nicki Guadalajara, PA-C  10/23/2021 4:15 PM     This note was completed using Dragon medical speech recognition software. Grammatical errors, random word insertions, pronoun errors, incorrect word insertion, misspellings and incomplete sentences are occasional consequences of this technology due to software limitations. If there are questions or concerns about the content of this note, or information contained within the body of this dictation, they should be addressed with the provider for clarification.

## 2021-11-01 ENCOUNTER — Ambulatory Visit: Payer: Medicaid Managed Care Other | Attending: Medical | Admitting: Pulmonary Disease

## 2021-11-01 VITALS — BP 125/89 | HR 81 | Ht 68.0 in | Wt 250.0 lb

## 2021-11-01 DIAGNOSIS — E662 Morbid (severe) obesity with alveolar hypoventilation: Secondary | ICD-10-CM

## 2021-11-01 DIAGNOSIS — Z6838 Body mass index (BMI) 38.0-38.9, adult: Secondary | ICD-10-CM

## 2021-11-01 DIAGNOSIS — G4733 Obstructive sleep apnea (adult) (pediatric): Secondary | ICD-10-CM

## 2021-11-01 NOTE — Progress Notes (Signed)
Subjective:    Patient ID: Taylor Wells is a 31 y.o. female.    HPI  Chief complaint: I am tired again  Is a very pleasant 32 year old female.  She presents at the request of primary care.  She lives in the [place] area and she is a Patent examiner.  Patient states that in the past she was diagnosed with sleep apnea.  She had fairly classic symptoms of fatigue snoring and apnea.  She underwent a sleep study which was abnormal I suspect the studies were done in the sleep lab but she was initially started on BiPAP therapy.  Patient states that after about 11 months or so she lost her insurance and was advised she needed to turn in her BiPAP device.  Currently the patient complains of daytime fatigue and sleepiness.  She notes that she is having trouble staying focused.  She does snore.  She does complain of daytime fatigue.  Epworth Sleepiness Scale is 12.  Patient's family history significant for sleep apnea.  The patient's father has sleep apnea.  Patient is up 3-4 times at night to urinate.  She denies sleepwalking sleep talking or any other parasomnias.  Does have a multiple awakenings during the night some of these are gasping awakenings some are not necessarily associated with that.  Other medical problems noted.    The following portions of the patient's history were reviewed and updated as appropriate: allergies, current medications, past family history, past medical history, past social history, past surgical history, and problem list.    Review of SystemsThe patient denies recent fevers chills or sweats.  There has been no syncopal events.  The patient denies chest pain.  There have been no hospitalizations.           Objective:    Physical Exam  Vital signs are noted/reviewed in the chart  Face is symmetric.   Pupils are equal and round   Eyelids and conjunctiva normal   Neck is supple.The neck circumference is 14 inches  Oropharynx demonstrates redundant tissue soft palate.  Tongue  base is broad.  Mallampati score is 3  Trachea is midline and mobile   Lungs with out wheeze or rhonchi   Cardiac regular rate and rhythm  Mental status oriented x 3 adequate memory and mood  Gait and station normal   Lower extremities without edema        Assessment:       1. Obstructive sleep apnea syndrome    2. Class 2 obesity with alveolar hypoventilation without serious comorbidity with body mass index (BMI) of 38.0 to 38.9 in adult          Plan:       Based on the patient's clinical examination and clinical history it is very likely that the patient has mild to moderate obstructive sleep apnea. Pathophysiology of the obstructive sleep apnea syndrome was discussed with the patient.  The usual testing environment and treatment modalities were reviewed.  Should be able to get started using a home sleep study.  We will follow up with the patient after the sleep study has been performed.

## 2021-11-13 ENCOUNTER — Ambulatory Visit: Payer: Medicaid Managed Care Other | Attending: Medical | Admitting: Medical

## 2021-11-13 ENCOUNTER — Encounter (RURAL_HEALTH_CENTER): Payer: Self-pay | Admitting: Medical

## 2021-11-13 VITALS — BP 125/103 | HR 109 | Temp 98.8°F | Wt 248.0 lb

## 2021-11-13 DIAGNOSIS — L0291 Cutaneous abscess, unspecified: Secondary | ICD-10-CM

## 2021-11-13 DIAGNOSIS — L02415 Cutaneous abscess of right lower limb: Secondary | ICD-10-CM

## 2021-11-13 MED ORDER — SULFAMETHOXAZOLE-TRIMETHOPRIM 800-160 MG PO TABS
1.0000 | ORAL_TABLET | Freq: Two times a day (BID) | ORAL | 0 refills | Status: AC
Start: 2021-11-13 — End: 2021-11-23

## 2021-11-13 NOTE — Progress Notes (Unsigned)
[facility]  [address] [place], [address]  [phone]         Office Note    Date: 11/13/2021  Patient Name: Taylor Wells, Taylor Wells  DOB: April 05, 1990                                                                Subjective   Subjective    HPI: Patient is a 31 y.o. female who presents today for painful red bump on inner thigh of right leg. First started 3 weeks ago, but has gotten worse in the last 3 days. Has drained a small amout of bloody, purulent drainage. No hx of recurrent abscesses. Pt reports she recently finished a 10 day course of doxycycline for bronchitis.    History:  The following portions of the patient's history were reviewed and updated as appropriate:     Medical History:   She  has a past medical history of Depression, Gastroesophageal reflux disease, and Sleep apnea.    Surgical History  She  has a past surgical history that includes Hernia repair (2010); Cholecystectomy (2014); and Hand surgery (Right).    Family History  Her family history includes Breast cancer in her paternal aunt; Breast cancer (age of onset: 12 35 89) in her maternal aunt and maternal aunt; COPD in her mother; Lung cancer in her maternal grandfather; Lupus in her sister; No known problems in her daughter, father, and son; Rheum arthritis in her sister; Schizophrenia in her mother; Skin cancer in her maternal grandmother; Thyroid disease in her maternal aunt, maternal grandmother, mother, and sister.    Social History  She  reports that she has been smoking cigarettes. She has a 6.50 pack-year smoking history. She has never used smokeless tobacco. She reports that she does not currently use alcohol. She reports current drug use. Drug: Marijuana.    Medications:  Current/Home Medications    ABILIFY MAINTENA ER INJECTION        ALBUTEROL SULFATE HFA (PROVENTIL) 108 (90 BASE)  MCG/ACT INHALER    Inhale 2 puffs into the lungs every 4 (four) hours as needed for Wheezing or Shortness of Breath    LUMATEPERONE TOSYLATE (CAPLYTA PO)    Take by mouth    MULTIPLE VITAMINS-MINERALS (MULTIVITAMIN WITH MINERALS) TABLET    Take 1 tablet by mouth daily    NICOTINE (NICODERM CQ) 14 MG/24HR    Place 1 patch onto the skin every 24 hours    PROPRANOLOL (INDERAL) 10 MG TABLET    Take 1 tablet (10 mg) by mouth 3 (three) times daily                                                                         Objective   Objective      Vitals:    11/13/21 1325   BP: (!) 125/103   Pulse: (!) 109   Temp: 98.8 F (37.1 C)   SpO2: 100%      Estimated  body mass index is 37.71 kg/m as calculated from the following:    Height as of 11/01/21: 1.727 m (5\' 8" ).    Weight as of this encounter: 112.5 kg (248 lb).    Physical Exam  Constitutional:       General: She is not in acute distress.  Pulmonary:      Effort: Pulmonary effort is normal.   Skin:         Neurological:      Mental Status: She is alert and oriented to person, place, and time.   Psychiatric:         Mood and Affect: Mood normal.         Speech: Speech normal.                                                                      Assessment   Assessment      1. Abscess of right leg                                                                           Plan   Plan     1. Abscess of right leg  - sulfamethoxazole-trimethoprim (Bactrim DS) 800-160 MG per tablet; Take 1 tablet by mouth 2 (two) times daily for 10 days  Dispense: 20 tablet; Refill: 0        Return in about 1 week (around 11/20/2021).       Boykin Peek, PA-C 11/13/2021 1:42 PM

## 2021-11-21 ENCOUNTER — Ambulatory Visit (RURAL_HEALTH_CENTER): Payer: Medicaid Managed Care Other | Admitting: Medical

## 2021-11-27 ENCOUNTER — Encounter (RURAL_HEALTH_CENTER): Payer: Self-pay | Admitting: Medical

## 2021-11-27 NOTE — Progress Notes (Signed)
Received record request from [place] faxed to [email or web address]

## 2022-10-03 DIAGNOSIS — J45909 Unspecified asthma, uncomplicated: Secondary | ICD-10-CM | POA: Insufficient documentation

## 2022-10-11 DIAGNOSIS — E282 Polycystic ovarian syndrome: Secondary | ICD-10-CM | POA: Insufficient documentation

## 2023-01-29 DIAGNOSIS — G459 Transient cerebral ischemic attack, unspecified: Secondary | ICD-10-CM

## 2023-01-29 HISTORY — DX: Transient cerebral ischemic attack, unspecified: G45.9

## 2023-01-29 NOTE — L&D Delivery Note (Cosign Needed Addendum)
 OB/GYN Faculty Practice Delivery Note  GENEA RHEAUME is a 33 y.o. [id number] s/p vaginal delivery at [redacted]w[redacted]d. She was admitted for PROM.   ROM: 18h 29m with clear fluid  GBS Status: Negative Maximum Maternal Temperature: 98.45F  Labor Progress: Initial SVE 3/50/-2, augmented with pitocin , progressed quickly to fully dilated/+3  Delivery Date/Time: 11/04/2023 1435 Delivery: Called to room and patient was complete and pushing. Head delivered ROA. No nuchal cord present. Shoulder and body delivered in usual fashion. Infant with spontaneous cry, placed on mother's abdomen, dried and stimulated. Cord clamped x 2 after 1-minute delay, and cut by baby's father. Cord blood drawn. Placenta delivered spontaneously, intact, with 3-vessel cord. Fundus firm with massage and Pitocin . Labia, perineum, vagina, and cervix inspected intact with no lacerations.   Placenta: Intact, delivered spontaneously Complications: None Lacerations: None EBL: Analgesia: Epidural  Infant: Viable female I APGARs 9min,5 min : 6,8   3400g  Houston Samuels, DO 11/04/2023, 3:21 PM   Attestation of Supervision of Resident: Evaluation and management procedures were performed by the learners: Family Medicine Resident under my supervision. I was immediately available for direct supervision, assistance and direction throughout this encounter.  I also confirm that I have verified the information documented in the resident's note, and that I have also personally reperformed the pertinent components of the physical exam and all of the medical decision making activities.  I have also made any necessary editorial changes.  Charlie DELENA Courts, MD FMOB Fellow, Faculty Practice [facility], Center for [facility]

## 2023-03-11 ENCOUNTER — Other Ambulatory Visit: Payer: Self-pay | Admitting: Family Medicine

## 2023-03-11 ENCOUNTER — Ambulatory Visit: Payer: Medicaid Other

## 2023-03-11 VITALS — BP 111/74 | HR 74

## 2023-03-11 DIAGNOSIS — Z3201 Encounter for pregnancy test, result positive: Secondary | ICD-10-CM

## 2023-03-11 DIAGNOSIS — O219 Vomiting of pregnancy, unspecified: Secondary | ICD-10-CM

## 2023-03-11 DIAGNOSIS — Z3200 Encounter for pregnancy test, result unknown: Secondary | ICD-10-CM

## 2023-03-11 DIAGNOSIS — O0990 Supervision of high risk pregnancy, unspecified, unspecified trimester: Secondary | ICD-10-CM

## 2023-03-11 DIAGNOSIS — O2621 Pregnancy care for patient with recurrent pregnancy loss, first trimester: Secondary | ICD-10-CM

## 2023-03-11 LAB — POCT URINE PREGNANCY: Preg Test, Ur: POSITIVE — AB

## 2023-03-11 MED ORDER — PROGESTERONE 200 MG PO CAPS
ORAL_CAPSULE | ORAL | 3 refills | Status: DC
Start: 2023-03-11 — End: 2023-09-02

## 2023-03-11 MED ORDER — DOXYLAMINE-PYRIDOXINE 10-10 MG PO TBEC: 2.0000 | DELAYED_RELEASE_TABLET | Freq: Every day | ORAL | 5 refills | Status: DC

## 2023-03-11 MED ORDER — VITAFOL GUMMIES 3.33-0.333-34.8 MG PO CHEW: 1.0000 | CHEWABLE_TABLET | Freq: Every day | ORAL | 5 refills | Status: DC

## 2023-03-11 NOTE — Addendum Note (Signed)
Addended by: Harrel Lemon on: 03/11/2023 09:52 AM   Modules accepted: Orders

## 2023-03-11 NOTE — Progress Notes (Signed)
Ms. Mcneely presents today for UPT. She has no unusual complaints. LMP:    OBJECTIVE: Appears well, in no apparent distress.  OB History     Gravida  6   Para  2   Term  1   Preterm  1   AB  4   Living  2      SAB  4   IAB      Ectopic      Multiple  0   Live Births  2          Home UPT Result: Positive In-Office UPT result: Positive I have reviewed the patient's medical, obstetrical, social, and family histories, and medications.   ASSESSMENT: Positive pregnancy test  PLAN Prenatal care to be completed at:  [facility], Ectopic, and extreme N/V precautions reviewed. HX PTD. Message sent to provider to advise on any next steps needed.

## 2023-03-17 ENCOUNTER — Inpatient Hospital Stay (HOSPITAL_COMMUNITY)
Admission: AD | Admit: 2023-03-17 | Discharge: 2023-03-17 | Disposition: A | Discharge status: Home / Self Care | Payer: Medicaid Other | Attending: Obstetrics and Gynecology | Admitting: Obstetrics and Gynecology

## 2023-03-17 ENCOUNTER — Inpatient Hospital Stay (HOSPITAL_COMMUNITY): Payer: Medicaid Other

## 2023-03-17 DIAGNOSIS — R109 Unspecified abdominal pain: Secondary | ICD-10-CM | POA: Diagnosis not present

## 2023-03-17 DIAGNOSIS — O3680X0 Pregnancy with inconclusive fetal viability, not applicable or unspecified: Secondary | ICD-10-CM | POA: Diagnosis not present

## 2023-03-17 DIAGNOSIS — Z3A01 Less than 8 weeks gestation of pregnancy: Secondary | ICD-10-CM | POA: Diagnosis not present

## 2023-03-17 DIAGNOSIS — O26891 Other specified pregnancy related conditions, first trimester: Secondary | ICD-10-CM | POA: Diagnosis not present

## 2023-03-17 DIAGNOSIS — O98311 Other infections with a predominantly sexual mode of transmission complicating pregnancy, first trimester: Secondary | ICD-10-CM | POA: Diagnosis not present

## 2023-03-17 DIAGNOSIS — O26899 Other specified pregnancy related conditions, unspecified trimester: Secondary | ICD-10-CM | POA: Diagnosis present

## 2023-03-17 DIAGNOSIS — R102 Pelvic and perineal pain: Secondary | ICD-10-CM | POA: Insufficient documentation

## 2023-03-17 DIAGNOSIS — A5901 Trichomonal vulvovaginitis: Secondary | ICD-10-CM | POA: Diagnosis present

## 2023-03-17 LAB — CBC
HCT: 41.3 % (ref 36.0–46.0)
Hemoglobin: 13.5 g/dL (ref 12.0–15.0)
MCH: 26 pg (ref 26.0–34.0)
MCHC: 32.7 g/dL (ref 30.0–36.0)
MCV: 79.4 fL — ABNORMAL LOW (ref 80.0–100.0)
Platelets: 330 10*3/uL (ref 150–400)
RBC: 5.2 MIL/uL — ABNORMAL HIGH (ref 3.87–5.11)
RDW: 14.7 % (ref 11.5–15.5)
WBC: 8.6 10*3/uL (ref 4.0–10.5)
nRBC: 0 % (ref 0.0–0.2)

## 2023-03-17 LAB — URINALYSIS, ROUTINE W REFLEX MICROSCOPIC
Bilirubin Urine: NEGATIVE
Glucose, UA: NEGATIVE mg/dL
Hgb urine dipstick: NEGATIVE
Ketones, ur: NEGATIVE mg/dL
Nitrite: NEGATIVE
Protein, ur: NEGATIVE mg/dL
Specific Gravity, Urine: 1.026 (ref 1.005–1.030)
pH: 6 (ref 5.0–8.0)

## 2023-03-17 LAB — COMPREHENSIVE METABOLIC PANEL
ALT: 54 U/L — ABNORMAL HIGH (ref 0–44)
AST: 35 U/L (ref 15–41)
Albumin: 3.6 g/dL (ref 3.5–5.0)
Alkaline Phosphatase: 44 U/L (ref 38–126)
Anion gap: 11 (ref 5–15)
BUN: 13 mg/dL (ref 6–20)
CO2: 24 mmol/L (ref 22–32)
Calcium: 9.7 mg/dL (ref 8.9–10.3)
Chloride: 104 mmol/L (ref 98–111)
Creatinine, Ser: 0.86 mg/dL (ref 0.44–1.00)
GFR, Estimated: >60 mL/min (ref >60)
Glucose, Bld: 93 mg/dL (ref 70–99)
Potassium: 3.9 mmol/L (ref 3.5–5.1)
Sodium: 139 mmol/L (ref 135–145)
Total Bilirubin: 0.3 mg/dL (ref 0.0–1.2)
Total Protein: 6.8 g/dL (ref 6.5–8.1)

## 2023-03-17 LAB — WET PREP, GENITAL
Sperm: NONE SEEN
WBC, Wet Prep HPF POC: <10 (ref <10)
Yeast Wet Prep HPF POC: NONE SEEN

## 2023-03-17 LAB — HCG, QUANTITATIVE, PREGNANCY: hCG, Beta Chain, Quant, S: 5823 m[IU]/mL — ABNORMAL HIGH (ref <5)

## 2023-03-17 MED ORDER — METRONIDAZOLE 500 MG PO TABS: 500.0000 mg | ORAL_TABLET | Freq: Two times a day (BID) | ORAL | 0 refills | Status: DC

## 2023-03-17 NOTE — MAU Note (Signed)
Pt says cramping in lower pelvis and back - started at 430pm-   3/10 - took reg Tyl  2 tabs . Now - cramping- 4/10 No VB  Has an appointment on 03-28-2023 at [facility]

## 2023-03-17 NOTE — MAU Provider Note (Signed)
Chief Complaint: Abdominal Pain   Event Date/Time   First Provider Initiated Contact with Patient 03/17/23 2155        SUBJECTIVE HPI: Taylor Buck is a 33 y.o. [id number] at [redacted]w[redacted]d by LMP who presents to maternity admissions reporting pelvic cramping since this afternoon.  Has a new OB appt coming up. . She denies vaginal bleeding, vaginal itching/burning, urinary symptoms, h/a, dizziness, n/v, or fever/chills.     Abdominal Pain This is a new problem. The current episode started today. The pain is mild. The quality of the pain is cramping. The abdominal pain does not radiate. Pertinent negatives include no constipation, diarrhea or dysuria. Nothing aggravates the pain. The pain is relieved by Nothing. She has tried acetaminophen for the symptoms. The treatment provided mild relief.   RN Note:  Pt says cramping in lower pelvis and back - started at 430pm-   3/10 - took reg Tyl  2 tabs . Now - cramping- 4/10 No VB  Has an appointment on 03-28-2023 at [facility]       Past Medical History:  Diagnosis Date   Anxiety    Arthritis    Asthma    Depression    Heart murmur    Infection    chlamydia age 31   PCOS (polycystic ovarian syndrome)    Sleep apnea    Urinary tract infection    Past Surgical History:  Procedure Laterality Date   HAND SURGERY     reconstruction- injury due to frostbite as child   HERNIA REPAIR     rt inguinal   Social History   Socioeconomic History   Marital status: Single    Spouse name: Not on file   Number of children: Not on file   Years of education: Not on file   Highest education level: Not on file  Occupational History   Not on file  Tobacco Use   Smoking status: Every Day    Current packs/day: 0.25    Types: Cigarettes   Smokeless tobacco: Never   Tobacco comments:    1 PACK A DAY - 4 CIGARETTES A DAY   Vaping Use   Vaping status: Never Used  Substance and Sexual Activity   Alcohol use: No   Drug use: Not Currently     Types: Marijuana    Comment: UP UNTIL +UPT   Sexual activity: Yes    Birth control/protection: None  Other Topics Concern   Not on file  Social History Narrative   Not on file   Social Drivers of Health   Financial Resource Strain: High Risk (10/11/2022)   Received from [facility]   Overall Financial Resource Strain (CARDIA)    Difficulty of Paying Living Expenses: Very hard  Food Insecurity: Food Insecurity Present (10/11/2022)   Received from [facility]   Hunger Vital Sign    Worried About Running Out of Food in the Last Year: Often true    Ran Out of Food in the Last Year: Sometimes true  Transportation Needs: No Transportation Needs (10/11/2022)   Received from [facility] - Transportation    Lack of Transportation (Medical): No    Lack of Transportation (Non-Medical): No  Physical Activity: Insufficiently Active (10/11/2022)   Received from [facility]   Exercise Vital Sign    Days of Exercise per Week: 2 days    Minutes of Exercise per Session: 10 min  Stress: Stress Concern Present (10/11/2022)   Received from [facility]  [employer] of Occupational Health - Occupational Stress Questionnaire    Feeling of Stress : Rather much  Social Connections: Moderately Integrated (10/11/2022)   Received from [facility]   Social Network    How would you rate your social network (family, work, friends)?: Adequate participation with social networks  Intimate Partner Violence: Not At Risk (10/11/2022)   Received from [facility]   HITS    Over the last 12 months how often did your partner physically hurt you?: Never    Over the last 12 months how often did your partner insult you or talk down to you?: Never    Over the last 12 months how often did your partner threaten you with physical harm?: Never    Over the last 12 months how often did your partner scream or curse at you?: Never   No current facility-administered medications on file prior to  encounter.   Current Outpatient Medications on File Prior to Encounter  Medication Sig Dispense Refill   acetaminophen (TYLENOL) 325 MG tablet Take 650 mg by mouth daily as needed for pain.     calcium carbonate (TUMS EX) 750 MG chewable tablet Chew 1 tablet by mouth at bedtime as needed (for nausea).     Doxylamine-Pyridoxine (DICLEGIS) 10-10 MG TBEC Take 2 tablets by mouth at bedtime. If symptoms persist, add one tablet in the morning and one in the afternoon 100 tablet 5   omeprazole (PRILOSEC) 20 MG capsule Take 1 capsule (20 mg total) by mouth daily. (Patient not taking: Reported on 06/19/2018) 60 capsule 5   Prenatal Vit-Fe Fumarate-FA (PRENATAL VITAMINS) 28-0.8 MG TABS Take 1 tablet by mouth every morning. 30 tablet 5   Prenatal Vit-Fe Phos-FA-Omega (VITAFOL GUMMIES) 3.33-0.333-34.8 MG CHEW CHEW 1 TABLET BY MOUTH EVERY DAY 90 tablet 5   progesterone (PROMETRIUM) 200 MG capsule Place one capsule vaginally at bedtime 30 capsule 3   sertraline (ZOLOFT) 25 MG tablet Take 1 tablet (25 mg total) by mouth daily. Take one 25 mg tab in addition to the 100 mg tab daily, to make 125 mg daily. (Patient taking differently: Take 125 mg by mouth daily. Take one 25 mg tab in addition to the 100 mg tab daily, to make 125 mg daily.) 30 tablet 2   Allergies  Allergen Reactions   Milk-Related Compounds Anaphylaxis   Latex Hives   Penicillins Hives    Has patient had a PCN reaction causing immediate rash, facial/tongue/throat swelling, SOB or lightheadedness with hypotension: Yes Has patient had a PCN reaction causing severe rash involving mucus membranes or skin necrosis: No Has patient had a PCN reaction that required hospitalization: No Has patient had a PCN reaction occurring within the last 10 years: No If all of the above answers are "NO", then may proceed with Cephalosporin use.    Phenergan [Promethazine Hcl]     Panic attack   Reglan [Metoclopramide] Other (See Comments)    Panic attack    I  have reviewed patient's Past Medical Hx, Surgical Hx, Family Hx, Social Hx, medications and allergies.   ROS:  Review of Systems  Gastrointestinal:  Positive for abdominal pain. Negative for constipation and diarrhea.  Genitourinary:  Negative for dysuria.   Review of Systems  Other systems negative   Physical Exam  Physical Exam Patient Vitals for the past 24 hrs:  BP Temp Temp src Pulse Resp Height Weight  03/17/23 1954 116/61 98 F (36.7 C) Oral 91 16 5\' 7"  (1.702 m) 116.1 kg  Constitutional: Well-developed, well-nourished female in no acute distress.  Cardiovascular: normal rate Respiratory: normal effort GI: Abd soft, non-tender.  MS: Extremities nontender, no edema, normal ROM Neurologic: Alert and oriented x 4.   PELVIC EXAM: deferred  LAB RESULTS Results for orders placed or performed during the hospital encounter of 03/17/23 (from the past 24 hours)  Urinalysis, Routine w reflex microscopic -Urine, Clean Catch     Status: Abnormal   Collection Time: 03/17/23  7:58 PM  Result Value Ref Range   Color, Urine YELLOW YELLOW   APPearance HAZY (A) CLEAR   Specific Gravity, Urine 1.026 1.005 - 1.030   pH 6.0 5.0 - 8.0   Glucose, UA NEGATIVE NEGATIVE mg/dL   Hgb urine dipstick NEGATIVE NEGATIVE   Bilirubin Urine NEGATIVE NEGATIVE   Ketones, ur NEGATIVE NEGATIVE mg/dL   Protein, ur NEGATIVE NEGATIVE mg/dL   Nitrite NEGATIVE NEGATIVE   Leukocytes,Ua MODERATE (A) NEGATIVE   RBC / HPF 0-5 0 - 5 RBC/hpf   WBC, UA 11-20 0 - 5 WBC/hpf   Bacteria, UA FEW (A) NONE SEEN   Squamous Epithelial / HPF 11-20 0 - 5 /HPF   Mucus PRESENT    Amorphous Crystal PRESENT   Wet prep, genital     Status: Abnormal   Collection Time: 03/17/23  8:00 PM   Specimen: PATH Cytology Cervicovaginal Ancillary Only  Result Value Ref Range   Yeast Wet Prep HPF POC NONE SEEN NONE SEEN   Trich, Wet Prep PRESENT (A) NONE SEEN   Clue Cells Wet Prep HPF POC PRESENT (A) NONE SEEN   WBC, Wet Prep HPF  POC <10 <10   Sperm NONE SEEN   CBC     Status: Abnormal   Collection Time: 03/17/23  8:12 PM  Result Value Ref Range   WBC 8.6 4.0 - 10.5 K/uL   RBC 5.20 (H) 3.87 - 5.11 MIL/uL   Hemoglobin 13.5 12.0 - 15.0 g/dL   HCT 11.9 14.7 - 82.9 %   MCV 79.4 (L) 80.0 - 100.0 fL   MCH 26.0 26.0 - 34.0 pg   MCHC 32.7 30.0 - 36.0 g/dL   RDW 56.2 13.0 - 86.5 %   Platelets 330 150 - 400 K/uL   nRBC 0.0 0.0 - 0.2 %  Comprehensive metabolic panel     Status: Abnormal   Collection Time: 03/17/23  8:12 PM  Result Value Ref Range   Sodium 139 135 - 145 mmol/L   Potassium 3.9 3.5 - 5.1 mmol/L   Chloride 104 98 - 111 mmol/L   CO2 24 22 - 32 mmol/L   Glucose, Bld 93 70 - 99 mg/dL   BUN 13 6 - 20 mg/dL   Creatinine, Ser 7.84 0.44 - 1.00 mg/dL   Calcium 9.7 8.9 - 69.6 mg/dL   Total Protein 6.8 6.5 - 8.1 g/dL   Albumin 3.6 3.5 - 5.0 g/dL   AST 35 15 - 41 U/L   ALT 54 (H) 0 - 44 U/L   Alkaline Phosphatase 44 38 - 126 U/L   Total Bilirubin 0.3 0.0 - 1.2 mg/dL   GFR, Estimated >29 >52 mL/min   Anion gap 11 5 - 15  hCG, quantitative, pregnancy     Status: Abnormal   Collection Time: 03/17/23  8:12 PM  Result Value Ref Range   hCG, Beta Chain, Quant, S 5,823 (H) <5 mIU/mL       IMAGING US OB LESS THAN 14 WEEKS WITH OB TRANSVAGINAL Result Date: 03/17/2023 CLINICAL  DATA:  Pelvic cramping, pregnancy of unknown location. EXAM: OBSTETRIC <14 WK [place] AND TRANSVAGINAL OB US TECHNIQUE: Both transabdominal and transvaginal ultrasound examinations were performed for complete evaluation of the gestation as well as the maternal uterus, adnexal regions, and pelvic cul-de-sac. Transvaginal technique was performed to assess early pregnancy. COMPARISON:  None available this pregnancy FINDINGS: Intrauterine gestational sac: Single Yolk sac:  Not Visualized. Embryo:  Not Visualized. MSD: 7.8 mm   5 w   4 d Subchorionic hemorrhage:  None visualized. Maternal uterus/adnexae: Both ovaries are visualized and are normal. There  is no adnexal mass. No pelvic free fluid. IMPRESSION: Probable early intrauterine gestational sac, but no yolk sac, fetal pole, or cardiac activity yet visualized. Recommend follow-up quantitative B-HCG levels and follow-up US in 14 days to assess viability. This recommendation follows SRU consensus guidelines: Diagnostic Criteria for Nonviable Pregnancy Early in the First Trimester. Malva Limes Med 2013; 161:0960-45. Electronically Signed   By: Narda Rutherford M.D.   On: 03/17/2023 21:40    MAU Management/MDM: I have reviewed the triage vital signs and the nursing notes.   Pertinent labs & imaging results that were available during my care of the patient were reviewed by me and considered in my medical decision making (see chart for details).      I have reviewed her medical records including past results, notes and treatments. Medical, Surgical, and family history were reviewed.  Medications and recent lab tests were reviewed  Ordered usual first trimester r/o ectopic labs.   Pelvic  cultures done Will check baseline Ultrasound to rule out ectopic.  This bleeding/pain can represent a normal pregnancy with bleeding, spontaneous abortion or even an ectopic which can be life-threatening.  The process as listed above helps to determine which of these is present.  Reviewed results with patient and her partner (pt invited him in) Discussed we saw gestational sac and recommend a followup US in 7-10 days Discussed finding of Trichomonas and recommended treatment.  Patient tearful  Explained both need to be treated and abstain until then   ASSESSMENT 1. Pregnancy of unknown anatomic location   2. Trichomonal vaginitis   3. [redacted] weeks gestation of pregnancy     PLAN Discharge home Will repeat  Ultrasound in about 7-10 days  SAB precautions Rx Flagyl for 7 days for Trichomonal vaginitis  Pt stable at time of discharge. Encouraged to return here if she develops worsening of symptoms, increase in  pain, fever, or other concerning symptoms.    Wynelle Bourgeois CNM, MSN Certified Nurse-Midwife 03/17/2023  9:55 PM

## 2023-03-18 DIAGNOSIS — A5901 Trichomonal vulvovaginitis: Secondary | ICD-10-CM | POA: Diagnosis present

## 2023-03-18 DIAGNOSIS — O26899 Other specified pregnancy related conditions, unspecified trimester: Secondary | ICD-10-CM | POA: Diagnosis present

## 2023-03-18 LAB — GC/CHLAMYDIA PROBE AMP (~~LOC~~) NOT AT ARMC
Chlamydia: NEGATIVE
Comment: NEGATIVE
Comment: NORMAL
Neisseria Gonorrhea: NEGATIVE

## 2023-03-25 ENCOUNTER — Telehealth: Payer: Self-pay | Admitting: *Deleted

## 2023-03-25 NOTE — Telephone Encounter (Signed)
 Spoke with pt regarding her dental needs.  Pt states she wears dentures and her top plate broke and is getting replaced.  Pt wants to know what is best for her diet needs. Pt advised she may continue soft foods and smoothies as she has been, recommend adding protein to smoothies or buying OTC supplements. Pt advised she may change to a capsule PNV or take  a [place] and allow it to dissolve.  Advised pt to keep hydrated and intake plenty of fluids.   Advised to call if any further needs.

## 2023-03-28 ENCOUNTER — Ambulatory Visit: Payer: Medicaid Other | Admitting: *Deleted

## 2023-03-28 ENCOUNTER — Other Ambulatory Visit (INDEPENDENT_AMBULATORY_CARE_PROVIDER_SITE_OTHER): Payer: Self-pay

## 2023-03-28 VITALS — BP 107/70 | HR 73 | Wt 259.7 lb

## 2023-03-28 DIAGNOSIS — Z1339 Encounter for screening examination for other mental health and behavioral disorders: Secondary | ICD-10-CM

## 2023-03-28 DIAGNOSIS — O0991 Supervision of high risk pregnancy, unspecified, first trimester: Secondary | ICD-10-CM

## 2023-03-28 DIAGNOSIS — Z3A01 Less than 8 weeks gestation of pregnancy: Secondary | ICD-10-CM

## 2023-03-28 DIAGNOSIS — O0990 Supervision of high risk pregnancy, unspecified, unspecified trimester: Secondary | ICD-10-CM | POA: Insufficient documentation

## 2023-03-28 DIAGNOSIS — O3680X0 Pregnancy with inconclusive fetal viability, not applicable or unspecified: Secondary | ICD-10-CM

## 2023-03-28 MED ORDER — BLOOD PRESSURE KIT DEVI: 1.0000 | 0 refills | Status: AC

## 2023-03-28 NOTE — Patient Instructions (Signed)

## 2023-03-28 NOTE — Progress Notes (Signed)
 New OB Intake  I connected with Taylor Buck  on 03/28/23 at  9:15 AM EST by In Person Visit and verified that I am speaking with the correct person using two identifiers. Nurse is located at [facility] and pt is located at [place].  I discussed the limitations, risks, security and privacy concerns of performing an evaluation and management service by telephone and the availability of in person appointments. I also discussed with the patient that there may be a patient responsible charge related to this service. The patient expressed understanding and agreed to proceed.  I explained I am completing New OB Intake today. We discussed EDD of 11/13/2023, by Last Menstrual Period. Pt is [name]. I reviewed her allergies, medications and Medical/Surgical/OB history.    Patient Active Problem List   Diagnosis Date Noted   Supervision of high risk pregnancy, antepartum 03/28/2023   Cramping affecting pregnancy, antepartum 03/18/2023   Trichomonas vaginitis 03/18/2023   PCOS (polycystic ovarian syndrome) 10/11/2022   Asthma 10/03/2022   Family history of breast cancer 08/23/2021   Nicotine dependence, cigarettes, uncomplicated 08/23/2021   Obstructive sleep apnea 08/23/2021   PTSD (post-traumatic stress disorder) 08/23/2021   H/O: substance abuse (HCC) 03/05/2018   Bipolar 1 disorder (HCC) 12/10/2017   History of depression 12/10/2017    Concerns addressed today  Delivery Plans Plans to deliver at [facility]. Discussed the nature of our practice with multiple providers including residents and students. Due to the size of the practice, the delivering provider may not be the same as those providing prenatal care.   Patient  undecided  interested in water birth. Offered upcoming OB visit with CNM to discuss further.  MyChart/Babyscripts MyChart access verified. I explained pt will have some visits in office and some virtually. Babyscripts instructions given and order placed. Patient  verifies receipt of registration text/e-mail. Account successfully created and app downloaded. If patient is a candidate for Optimized scheduling, add to sticky note.   Blood Pressure Cuff/Weight Scale Blood pressure cuff ordered for patient to pick-up from [employer]. Explained after first prenatal appt pt will check weekly and document in Babyscripts. Patient does not have weight scale; patient may purchase if they desire to track weight weekly in Babyscripts.  Anatomy US Explained first scheduled [place] will be around 19 weeks. Anatomy US scheduled for TBD at TBD.  Interested in [place]? If yes, send referral and doula dot phrase.   Is patient a candidate for Babyscripts Optimization? No, due to Risk Factors   First visit review I reviewed new OB appt with patient. Explained pt will be seen by Clement Sayres, PA at first visit. Discussed Avelina Laine genetic screening with patient. Requests Panorama and Horizon.. Routine prenatal labs  OB Urine only collected at today's visit.    Last Pap Diagnosis  Date Value Ref Range Status  11/06/2017   Final   NEGATIVE FOR INTRAEPITHELIAL LESIONS OR MALIGNANCY.    Harrel Lemon, RN 03/28/2023  11:29 AM

## 2023-03-30 LAB — CULTURE, OB URINE

## 2023-03-30 LAB — URINE CULTURE, OB REFLEX

## 2023-03-31 ENCOUNTER — Ambulatory Visit (HOSPITAL_COMMUNITY)
Admission: RE | Admit: 2023-03-31 | Discharge: 2023-03-31 | Disposition: A | Discharge status: Home / Self Care | Payer: Medicaid Other | Source: Ambulatory Visit | Attending: Advanced Practice Midwife | Admitting: Advanced Practice Midwife

## 2023-03-31 DIAGNOSIS — Z3A01 Less than 8 weeks gestation of pregnancy: Secondary | ICD-10-CM | POA: Insufficient documentation

## 2023-03-31 DIAGNOSIS — O26899 Other specified pregnancy related conditions, unspecified trimester: Secondary | ICD-10-CM | POA: Insufficient documentation

## 2023-03-31 DIAGNOSIS — R109 Unspecified abdominal pain: Secondary | ICD-10-CM | POA: Diagnosis present

## 2023-03-31 DIAGNOSIS — A5901 Trichomonal vulvovaginitis: Secondary | ICD-10-CM | POA: Diagnosis present

## 2023-03-31 DIAGNOSIS — O3680X0 Pregnancy with inconclusive fetal viability, not applicable or unspecified: Secondary | ICD-10-CM | POA: Diagnosis present

## 2023-04-10 ENCOUNTER — Telehealth: Payer: Self-pay

## 2023-04-10 NOTE — Telephone Encounter (Signed)
 S/w pt after babyscripts alert of 130/91 with headache and seeing floaters last night. Pt took BP while on phone today 130/85, denies headache and floaters today. Advised pt of abnormal symptoms to monitor for and call back if they occur or report to mau. Advised to continue to monitor BP until appt on 3/20, pt agreed.

## 2023-04-17 ENCOUNTER — Other Ambulatory Visit (HOSPITAL_COMMUNITY)
Admission: RE | Admit: 2023-04-17 | Discharge: 2023-04-17 | Disposition: A | Discharge status: Home / Self Care | Source: Ambulatory Visit | Attending: Physician Assistant | Admitting: Physician Assistant

## 2023-04-17 ENCOUNTER — Other Ambulatory Visit: Payer: Self-pay | Admitting: Physician Assistant

## 2023-04-17 ENCOUNTER — Ambulatory Visit: Payer: Medicaid Other | Admitting: Physician Assistant

## 2023-04-17 VITALS — BP 119/75 | HR 78 | Wt 263.6 lb

## 2023-04-17 DIAGNOSIS — Z1339 Encounter for screening examination for other mental health and behavioral disorders: Secondary | ICD-10-CM

## 2023-04-17 DIAGNOSIS — Z6841 Body Mass Index (BMI) 40.0 and over, adult: Secondary | ICD-10-CM | POA: Diagnosis not present

## 2023-04-17 DIAGNOSIS — A5901 Trichomonal vulvovaginitis: Secondary | ICD-10-CM | POA: Diagnosis not present

## 2023-04-17 DIAGNOSIS — N96 Recurrent pregnancy loss: Secondary | ICD-10-CM

## 2023-04-17 DIAGNOSIS — O0990 Supervision of high risk pregnancy, unspecified, unspecified trimester: Secondary | ICD-10-CM | POA: Diagnosis present

## 2023-04-17 DIAGNOSIS — O0991 Supervision of high risk pregnancy, unspecified, first trimester: Secondary | ICD-10-CM

## 2023-04-17 DIAGNOSIS — N6452 Nipple discharge: Secondary | ICD-10-CM

## 2023-04-17 DIAGNOSIS — Z8659 Personal history of other mental and behavioral disorders: Secondary | ICD-10-CM

## 2023-04-17 DIAGNOSIS — Z3A10 10 weeks gestation of pregnancy: Secondary | ICD-10-CM | POA: Diagnosis not present

## 2023-04-17 MED ORDER — SERTRALINE HCL 25 MG PO TABS: 25.0000 mg | ORAL_TABLET | Freq: Every day | ORAL | 2 refills | Status: DC

## 2023-04-17 MED ORDER — ASPIRIN 81 MG PO TBEC: 81.0000 mg | DELAYED_RELEASE_TABLET | Freq: Every day | ORAL | 12 refills | Status: DC

## 2023-04-17 NOTE — Progress Notes (Signed)
 PRENATAL VISIT NOTE  Subjective:  Taylor Buck is a 33 y.o. [name] at [redacted]w[redacted]d being seen today for ongoing prenatal care.  She is currently monitored for the following issues for this high-risk pregnancy and has Bipolar 1 disorder (HCC); History of depression; H/O: substance abuse (HCC); Cramping affecting pregnancy, antepartum; Trichomonas vaginitis; Asthma; Family history of breast cancer; Nicotine dependence, cigarettes, uncomplicated; Obstructive sleep apnea; PCOS (polycystic ovarian syndrome); PTSD (post-traumatic stress disorder); and Supervision of high risk pregnancy, antepartum on their problem list.  Patient reports  nipple discharge from L breast .  Contractions: Not present. Vag. Bleeding: None.  Movement: Absent. Denies leaking of fluid.   She is planning to breastfeed. Desires Mirena for contraception.   #L nipple discharge Since one week before Christmas 2024. Goes through cycles of pain and swelling of the breast that is relieved after expressing white drainage from the piercing holes in nipple. She has not had jewelry in those piercing sites for several years. Believes it is mastitis, but she has not been breastfeeding for several years. She went to urgent care for this problem twice previously with accompanying fever where she received and completed courses of antibiotics. An ultrasound was ordered, but she did not present for it, as she temporarily felt better.   Patient denies fever, chills, n/v, malaise, myalgias, loss of appetite.   Indications for ASA therapy (per uptodate) One of the following: Previous pregnancy with preeclampsia, especially early onset and with an adverse outcome No Multifetal gestation No Chronic hypertension No Type 1 or 2 diabetes mellitus No Chronic kidney disease No Autoimmune disease (antiphospholipid syndrome, systemic lupus erythematosus) No  Two or more of the following: Nulliparity No Obesity (body mass index >30 kg/m2)  Yes Family history of preeclampsia in mother or sister No Age >=35 years No Sociodemographic characteristics (African American race, low socioeconomic level) Yes Personal risk factors (eg, previous pregnancy with low birth weight or small for gestational age infant, previous adverse pregnancy outcome [eg, stillbirth], interval >10 years between pregnancies) No  The following portions of the patient's history were reviewed and updated as appropriate: allergies, current medications, past family history, past medical history, past social history, past surgical history and problem list.   Objective:   Vitals:   04/17/23 0812  BP: 119/75  Pulse: 78  Weight: 263 lb 9.6 oz (119.6 kg)    Fetal Status:     Movement: Absent     General:  Alert, oriented and cooperative. Patient is in no acute distress.  Skin: Breast:  Skin is warm and dry. No rash noted.  L nipple with hole from previous piercing, where purulent discharge is expressed from L breast. L breast without masses, lesions, discoloration, inflammation, skin retraction, dimpling, or axillary lymph node enlargement.   Cardiovascular: Normal heart rate noted  Respiratory: Normal respiratory effort, no problems with respiration noted  Abdomen: Soft, gravid, appropriate for gestational age.  Pain/Pressure: Absent     Pelvic: Cervical exam deferred        Extremities: Normal range of motion.  Edema: Trace (hands)  Mental Status: Normal mood and affect. Normal behavior. Normal judgment and thought content.   Assessment and Plan:  Pregnancy: [id number] at [redacted]w[redacted]d  1. Supervision of high risk pregnancy, antepartum (Primary) Initial labs drawn. Continue prenatal vitamins. Genetic Screening discussed: NIPS, carrier screening and AFP  Ultrasound discussed; fetal anatomic survey: 06/26/23 Problem list reviewed and updated. Reviewed Brx optimized schedule, patient agreeable The nature of [facility]  Practice with  multiple MDs and other Advanced Practice Providers was explained to patient; also emphasized that residents, students are part of our team. Routine obstetric precautions reviewed.  - CBC/D/Plt+RPR+Rh+ABO+RubIgG... - Hemoglobin A1c - PANORAMA PRENATAL TEST - HORIZON Basic Panel - Cervicovaginal ancillary only - aspirin EC 81 MG tablet; Take 1 tablet (81 mg total) by mouth daily. Swallow whole.  Dispense: 30 tablet; Refill: 12  2. [redacted] weeks gestation of pregnancy Anticipatory guidance about next visits/weeks of pregnancy given.   3. History of depression Patient with positive PHQ-9 (18) and GAD-7 (18), requests to restart Sertraline today for increased anxiety and life stressors. Reviewed possible AEs of SSRIs, and expected improvement in 6-8 weeks. Discussed possibility of clinical worsening of symptoms before improvement and emphasized need for close ongoing communication and monitoring with providers. Patient states understanding.  - sertraline (ZOLOFT) 25 MG tablet; Take 1 tablet (25 mg total) by mouth daily.  Dispense: 30 tablet; Refill: 2  4. Nipple discharge Patient with purulent drainage on expression from L nipple x3 months. Per patient, previously treated twice at outside urgent care with courses of ABX that provided temporary relief (record not available in EMR). Drainage present on exam with expression and no other visible abnormalities of the breast. Concern for abscess. Though currently no ssxs of systemic infection, advised that if she becomes febrile, has chills, n/v, malaise, myalgias, worsening breast pain, she should present to urgent care or emergency room for evaluation. Patient states understanding. Patient disposition pending the following: - Anaerobic and Aerobic Culture - [place] LIMITED ULTRASOUND INCLUDING AXILLA LEFT BREAST ; Future  5. Trichomonas vaginitis Dx 03/17/23  Patient compliant on Flagyl regimen  TOC today - Cervicovaginal ancillary only  6. BMI 40.0-44.9,  adult (HCC) Growth scan q4 at 24 weeks  Antenatal testing weekly at 34 weeks  Advised start aspirin once daily at [redacted] weeks GA  7. History of recurrent miscarriages Continue Prometrium 200 mg tablets PV   Preterm labor symptoms and general obstetric precautions including but not limited to vaginal bleeding, contractions, leaking of fluid and fetal movement were reviewed in detail with the patient.  Please refer to After Visit Summary for other counseling recommendations.   Return in about 4 weeks (around 05/15/2023) for LOB.  Future Appointments  Date Time Provider Department Center  05/15/2023  8:55 AM Sue Lush, FNP [facility] None  06/26/2023 10:15 AM [facility] NURSE [facility]  06/26/2023 10:30 AM [facility] US1 [facility]  06/26/2023 11:00 AM [facility] PROVIDER 1 [facility]    Ralene Muskrat, PA-C

## 2023-04-17 NOTE — Progress Notes (Signed)
 Pt presents for new ob. Pt states that she is having headaches and would like to know if it is safe to continue to diet during pregnancy.

## 2023-04-18 ENCOUNTER — Encounter: Payer: Self-pay | Admitting: Physician Assistant

## 2023-04-18 ENCOUNTER — Ambulatory Visit

## 2023-04-18 VITALS — BP 108/69 | HR 78

## 2023-04-18 DIAGNOSIS — N6452 Nipple discharge: Secondary | ICD-10-CM

## 2023-04-18 LAB — CBC/D/PLT+RPR+RH+ABO+RUBIGG...
Antibody Screen: NEGATIVE
Basophils Absolute: 0 10*3/uL (ref 0.0–0.2)
Basos: 0 %
EOS (ABSOLUTE): 0.1 10*3/uL (ref 0.0–0.4)
Eos: 2 %
HCV Ab: NONREACTIVE
HIV Screen 4th Generation wRfx: NONREACTIVE
Hematocrit: 39.5 % (ref 34.0–46.6)
Hemoglobin: 12.8 g/dL (ref 11.1–15.9)
Hepatitis B Surface Ag: NEGATIVE
Immature Grans (Abs): 0 10*3/uL (ref 0.0–0.1)
Immature Granulocytes: 0 %
Lymphocytes Absolute: 1.5 10*3/uL (ref 0.7–3.1)
Lymphs: 18 %
MCH: 26.2 pg — ABNORMAL LOW (ref 26.6–33.0)
MCHC: 32.4 g/dL (ref 31.5–35.7)
MCV: 81 fL (ref 79–97)
Monocytes Absolute: 0.4 10*3/uL (ref 0.1–0.9)
Monocytes: 5 %
Neutrophils Absolute: 6.1 10*3/uL (ref 1.4–7.0)
Neutrophils: 75 %
Platelets: 303 10*3/uL (ref 150–450)
RBC: 4.88 x10E6/uL (ref 3.77–5.28)
RDW: 14.7 % (ref 11.7–15.4)
RPR Ser Ql: NONREACTIVE
Rh Factor: POSITIVE
Rubella Antibodies, IGG: 8.92 {index} (ref >0.99)
WBC: 8.2 10*3/uL (ref 3.4–10.8)

## 2023-04-18 LAB — CERVICOVAGINAL ANCILLARY ONLY
Chlamydia: NEGATIVE
Comment: NEGATIVE
Comment: NEGATIVE
Comment: NORMAL
Neisseria Gonorrhea: NEGATIVE
Trichomonas: NEGATIVE

## 2023-04-18 LAB — HEMOGLOBIN A1C
Est. average glucose Bld gHb Est-mCnc: 123 mg/dL
Hgb A1c MFr Bld: 5.9 % — ABNORMAL HIGH (ref 4.8–5.6)

## 2023-04-18 LAB — HCV INTERPRETATION

## 2023-04-18 NOTE — Progress Notes (Signed)
 Pt presents for swab for nipple discharge. At appt yesterday with Earlene Plater, [place] pt had swab done for nipple discharge but was not on the correct swab. Pt scheduled for nurse visit today to reswab discharge on correct swab. Limited amount of discharge today compared to yesterday. GBS swab done per lab recommendations and sent.

## 2023-04-18 NOTE — Addendum Note (Signed)
 Addended by: Gavin Potters on: 04/18/2023 08:45 AM   Modules accepted: Orders

## 2023-04-21 ENCOUNTER — Other Ambulatory Visit: Payer: Self-pay | Admitting: Physician Assistant

## 2023-04-21 LAB — AEROBIC CULTURE

## 2023-04-22 ENCOUNTER — Other Ambulatory Visit: Payer: Self-pay | Admitting: Physician Assistant

## 2023-04-22 DIAGNOSIS — O91119 Abscess of breast associated with pregnancy, unspecified trimester: Secondary | ICD-10-CM

## 2023-04-22 MED ORDER — CEPHALEXIN 500 MG PO CAPS: 500.0000 mg | ORAL_CAPSULE | Freq: Four times a day (QID) | ORAL | 0 refills | Status: AC

## 2023-04-22 NOTE — Progress Notes (Signed)
 Patient received rocephin and keflex in 04/26/2012 ED encounter without reaction. Discussed anaphylactic ssxs that would warrant call to EMS.

## 2023-04-23 LAB — PANORAMA PRENATAL TEST FULL PANEL:PANORAMA TEST PLUS 5 ADDITIONAL MICRODELETIONS: FETAL FRACTION: 3.3

## 2023-04-25 ENCOUNTER — Encounter: Payer: Self-pay | Admitting: Physician Assistant

## 2023-04-30 ENCOUNTER — Encounter: Payer: Self-pay | Admitting: Physician Assistant

## 2023-04-30 ENCOUNTER — Telehealth: Payer: Self-pay

## 2023-04-30 ENCOUNTER — Inpatient Hospital Stay (HOSPITAL_COMMUNITY)
Admission: AD | Admit: 2023-04-30 | Discharge: 2023-04-30 | Disposition: A | Discharge status: Home / Self Care | Attending: Obstetrics and Gynecology | Admitting: Obstetrics and Gynecology

## 2023-04-30 ENCOUNTER — Other Ambulatory Visit: Payer: Self-pay

## 2023-04-30 DIAGNOSIS — Z141 Cystic fibrosis carrier: Secondary | ICD-10-CM | POA: Insufficient documentation

## 2023-04-30 DIAGNOSIS — N898 Other specified noninflammatory disorders of vagina: Secondary | ICD-10-CM

## 2023-04-30 DIAGNOSIS — Z3A11 11 weeks gestation of pregnancy: Secondary | ICD-10-CM | POA: Diagnosis not present

## 2023-04-30 DIAGNOSIS — O26891 Other specified pregnancy related conditions, first trimester: Secondary | ICD-10-CM | POA: Diagnosis not present

## 2023-04-30 DIAGNOSIS — A5901 Trichomonal vulvovaginitis: Secondary | ICD-10-CM

## 2023-04-30 DIAGNOSIS — Z3491 Encounter for supervision of normal pregnancy, unspecified, first trimester: Secondary | ICD-10-CM | POA: Insufficient documentation

## 2023-04-30 DIAGNOSIS — O26899 Other specified pregnancy related conditions, unspecified trimester: Secondary | ICD-10-CM

## 2023-04-30 DIAGNOSIS — O0990 Supervision of high risk pregnancy, unspecified, unspecified trimester: Secondary | ICD-10-CM

## 2023-04-30 LAB — URINALYSIS, ROUTINE W REFLEX MICROSCOPIC
Bacteria, UA: NONE SEEN
Bilirubin Urine: NEGATIVE
Glucose, UA: NEGATIVE mg/dL
Hgb urine dipstick: NEGATIVE
Ketones, ur: NEGATIVE mg/dL
Nitrite: NEGATIVE
Protein, ur: NEGATIVE mg/dL
Specific Gravity, Urine: 1.014 (ref 1.005–1.030)
pH: 6 (ref 5.0–8.0)

## 2023-04-30 LAB — WET PREP, GENITAL
Clue Cells Wet Prep HPF POC: NONE SEEN
Sperm: NONE SEEN
Trich, Wet Prep: NONE SEEN
WBC, Wet Prep HPF POC: <10 (ref <10)
Yeast Wet Prep HPF POC: NONE SEEN

## 2023-04-30 LAB — HORIZON CUSTOM: REPORT SUMMARY: POSITIVE — AB

## 2023-04-30 NOTE — Discharge Instructions (Signed)
Commonly Asked Questions During Pregnancy  How Will I Feel When I'm Pregnant? Pregnancy symptoms in the first trimester of pregnancy may not appear until the middle or end of the second month. Hormonal changes will cause tenderness in your breasts, and you may begin to feel more tired than usual. Food cravings, an increase in the need to urinate, and morning sickness may all be more noticeable.  Pregnancy symptoms in the second trimester are more prominent. You may start to feel the baby move and become more active. Dental issues, nasal/sinus problems, and skin irritations can begin to appear. Heartburn, leg cramps, dizziness, and a vaginal discharge are also common. Every woman is different when it comes to the symptoms they experience, and some may not experience any at all. Pregnancy symptoms in the third trimester can include increased frequency in urination, leg cramps, constipation, ligament pain in the abdomen, and weight gain. Back pain and Braxton Hicks contractions will become increasingly more common.  Why is nutrition during pregnancy important? Eating well is one of the best things you can do during pregnancy. Good nutrition helps you handle the extra demands on your body as your pregnancy progresses. The goal is to balance getting enough nutrients to support the growth of your fetus and maintaining a healthy weight.  How much water should I drink during pregnancy? During pregnancy you should drink 8 to 12 cups (64 to 96 ounces) of water every day. Water has many benefits. It aids digestion and helps form the amniotic fluid around the fetus. Water also helps nutrients circulate in the body and helps waste leave the body.  What can I do to help with nausea? Eat dry toast or crackers in the morning before you get out of bed to avoid moving around on an empty stomach. Eat five or six "mini meals" a day to ensure that your stomach is never empty. Eat frequent bites of foods like nuts,  fruits, or crackers.  What can help with constipation during pregnancy? Constipation is common near the end of pregnancy. Eating more foods with fiber can help fight constipation. Fiber is found in fruits, vegetables, whole grains, beans, nuts, and seeds. You should aim for about 25 grams of fiber in your diet each day. Drink a lot of water as you increase your fiber intake.  How much coffee can I drink while I'm pregnant? Research suggests that moderate caffeine consumption (less than 200 milligrams per day) does not cause miscarriage or preterm birth. That's the amount in one 12-ounce cup of coffee. Remember that caffeine also is found in tea, chocolate, energy drinks, and soft drinks. Caffeine can interfere with sleep and contribute to nausea and light-headedness. Caffeine also can increase urination and lead to dehydration.  What can I do to prevent or ease back pain during pregnancy? There are several things you can do to prevent or ease back pain. For example, wear supportive clothing and shoes. Pay attention to your position when sitting, sleeping, and lifting things. If you need to stand for a long time, rest one foot on a stool or a box to take the strain off your back. You also can use heat or cold to soothe sore muscles.  Is it safe to exercise during pregnancy? If you are healthy and your pregnancy is normal, it is safe to continue or start regular physical activity. Physical activity does not increase your risk of miscarriage, low birth weight, or early delivery. It's still important to discuss exercise with your ob-gyn  provider during your early prenatal visits.   What are the benefits of exercise during pregnancy? Regular exercise during pregnancy benefits you and your fetus in these key ways: Reduces back pain Eases constipation May decrease your risk of gestational diabetes, preeclampsia, and cesarean birth Promotes healthy weight gain during pregnancy Improves your overall  fitness and strengthens your heart and blood vessels Helps you to lose the baby weight after your baby is born  Is it safe to dye my hair during pregnancy? Yes, it's safe. Only a small amount of chemicals from hair dye is absorbed through the scalp.  Is it safe to keep a cat during pregnancy? Yes, you can keep your cat. You may have heard that cat feces can carry the infection toxoplasmosis. This infection is only found in cats who go outdoors and hunt prey, such as mice and other rodents. If you do have a cat who goes outdoors or eats prey, have someone else take over daily cleaning the litter box. This will keep you away from any cat feces. If you have an indoor cat who only eats cat food and doesn't have contact with outside animals, your risk of toxoplasmosis is very low.  What substances should I avoid during pregnancy? During pregnancy, women should not use tobacco, alcohol, marijuana, illegal drugs, or prescription medications for nonmedical reasons. Avoiding these substances and getting regular prenatal care are important to having a healthy pregnancy and a healthy baby.   What foods to I need to avoid in pregnancy? To help prevent listeriosis, avoid eating the following foods while you are pregnant: Unpasteurized milk and foods made with unpasteurized milk, including soft cheeses Hot dogs and luncheon meats, unless they are heated until steaming hot just before serving Unwashed raw produce such as fruits and vegetables  Avoid all raw and undercooked seafood, eggs, meat, and poultry while you are pregnant. Do not eat sushi made with raw fish (cooked sushi is safe). Cooking and pasteurization are the only ways to kill Listeria.  Limit your exposure to mercury by not eating bigeye tuna, king mackerel, marlin, orange roughy, shark, swordfish, or tilefish. Limit eating white (albacore) tuna to 6 ounces a week. You do not have to avoid all fish during pregnancy. In fact, fish and shellfish are  nutritious foods with vital nutrients for a pregnant woman and her fetus. Be sure to eat at least 8-12 ounces of low-mercury fish and shellfish per week.  Is travel safe to during pregnancy? In most cases, pregnant women can travel safely until close to their due dates. But travel may not be recommended for women who have pregnancy complications. If you are planning a trip, talk with your (ob-gyn) provider. And no matter how you choose to travel, think ahead about your comfort and safety.  Can I use a sauna or hot tub early in pregnancy? It's best not to. Your core body temperature rises when you use saunas and hot tubs. This rise in temperature can be harmful for your fetus.  Can I get a massage while pregnant? Yes. Massage is a good way to relax and improve circulation. The best position for a massage while you're pregnant is lying on your side, rather than facedown. Some massage tables have a cut-out for the belly, allowing you to lie facedown comfortably. Tell your massage therapist that you're pregnant if you're not showing yet. Many health spas offer special prenatal massages done by therapists who are trained to work on pregnant women.  Is Having Dental  Work While Pregnant Safe? Pregnancy and dental work questions are common for expecting moms. Preventive dental cleanings and annual exams during pregnancy are not only safe but are recommended. The rise in hormone levels during pregnancy causes the gums to swell, bleed, and trap food causing increased irritation to your gums. Preventive dental work while pregnant is essential to avoid oral infections such as gum disease, which has been linked to preterm birth. The American Dental Association (ADA) recommends pregnant women eat a balanced diet, brush their teeth thoroughly with ADA-approved fluoride toothpaste twice a day, and floss daily. Have preventive exams and cleanings during your pregnancy. Let your dentist know you are  pregnant. Postpone non-emergency dental work until the second trimester or after delivery, if possible. Elective procedures should be postponed until after the delivery.  

## 2023-04-30 NOTE — MAU Note (Signed)
 Taylor Buck is a 33 y.o. at [redacted]w[redacted]d here in MAU reporting: she woke up with her bed wet and doesn't think it was urine.  States area on bed had odor, but didn't smell like urine and didn't have a bad odor.  States did have have more discharge yesterday.  Reports legs felt really sticky.  Denies VB.  LMP: 02/06/2023 Onset of complaint: today Pain score: 0 Vitals:   04/30/23 1005  BP: (!) 113/59  Pulse: 70  Resp: 18  Temp: 98.5 F (36.9 C)  SpO2: 100%     FHT: 157 bpm  Lab orders placed from triage: None

## 2023-04-30 NOTE — MAU Provider Note (Signed)
 S [name] is a 33 y.o. [id number] patient who presents to MAU today with complaint of waking up this morning with an area of her bed being wet. She reports it did not have any odor and she denies any dysuria, urinary frequency but reports she has had some mildly increased vaginal discharge. Denies any VB and offers no c/o abdominal cramping. Upon EMAR review patient is on progesterone vaginal suppositories nightly    Review of Systems  All other systems reviewed and are negative.  Unless otherwise noted in HPI  O BP (!) 113/59 (BP Location: Right Arm)   Pulse 70   Temp 98.5 F (36.9 C) (Oral)   Resp 18   Ht 5\' 8"  (1.727 m)   Wt 117.8 kg   LMP 02/06/2023 (Approximate)   SpO2 100%   BMI 39.49 kg/m  Physical Exam Constitutional:      General: She is not in acute distress.    Appearance: Normal appearance. She is obese. She is not ill-appearing.  HENT:     Head: Normocephalic.     Nose: Nose normal.     Mouth/Throat:     Mouth: Mucous membranes are moist.  Cardiovascular:     Rate and Rhythm: Normal rate.  Pulmonary:     Effort: Pulmonary effort is normal.     Breath sounds: Normal breath sounds.  Abdominal:     Palpations: Abdomen is soft.  Musculoskeletal:        General: Normal range of motion.     Cervical back: Normal range of motion.  Skin:    General: Skin is warm.  Neurological:     Mental Status: She is alert and oriented to person, place, and time.  Psychiatric:        Mood and Affect: Mood normal.        Behavior: Behavior normal.     Orders Placed This Encounter  Procedures   Wet prep, genital    Standing Status:   Standing    Number of Occurrences:   1   Urinalysis, Routine w reflex microscopic -Urine, Random    Standing Status:   Standing    Number of Occurrences:   1    Specimen Source:   Urine, Random [244]   Discharge patient Discharge disposition: 01-Home or Self Care; Discharge patient date: 04/30/2023    Standing  Status:   Standing    Number of Occurrences:   1    Discharge disposition:   01-Home or Self Care [1]    Discharge patient date:   04/30/2023    Results for orders placed or performed during the hospital encounter of 04/30/23 (from the past 24 hours)  Urinalysis, Routine w reflex microscopic -Urine, Random     Status: Abnormal   Collection Time: 04/30/23 10:20 AM  Result Value Ref Range   Color, Urine YELLOW YELLOW   APPearance CLEAR CLEAR   Specific Gravity, Urine 1.014 1.005 - 1.030   pH 6.0 5.0 - 8.0   Glucose, UA NEGATIVE NEGATIVE mg/dL   Hgb urine dipstick NEGATIVE NEGATIVE   Bilirubin Urine NEGATIVE NEGATIVE   Ketones, ur NEGATIVE NEGATIVE mg/dL   Protein, ur NEGATIVE NEGATIVE mg/dL   Nitrite NEGATIVE NEGATIVE   Leukocytes,Ua TRACE (A) NEGATIVE   RBC / HPF 0-5 0 - 5 RBC/hpf   WBC, UA 0-5 0 - 5 WBC/hpf   Bacteria, UA NONE SEEN NONE SEEN   Squamous Epithelial / HPF 6-10 0 - 5 /HPF  Mucus PRESENT   Wet prep, genital     Status: None   Collection Time: 04/30/23 10:24 AM  Result Value Ref Range   Yeast Wet Prep HPF POC NONE SEEN NONE SEEN   Trich, Wet Prep NONE SEEN NONE SEEN   Clue Cells Wet Prep HPF POC NONE SEEN NONE SEEN   WBC, Wet Prep HPF POC <10 <10   Sperm NONE SEEN      ASSESSMENT Medical screening exam complete - Vaginal discharge in pregnancy - [redacted]w[redacted]d IUP  - No acute process identified (vaginal discharge is likely d/t vaginal progesterone that she is taking nightly)     PLAN Discharge from MAU in stable condition with return precautions Patient verbalized understanding and understands that vaginal progesterone will increase her vaginal discharge. She verbalized understanding  Future Appointments  Date Time Provider Department Center  05/14/2023  9:40 AM [facility] DIAG [place] 1 [facility]  05/14/2023  9:50 AM [facility] [place] 1 [facility]  05/15/2023  8:55 AM Sue Lush, FNP [facility] None  06/26/2023 10:15 AM [facility] NURSE [facility]  06/26/2023  10:30 AM [facility] US1 [facility]  06/26/2023 11:00 AM [facility] PROVIDER 1 [facility]    Warning signs for worsening condition that would warrant emergency follow-up discussed Patient may return to MAU as needed   Colman Cater, NP 04/30/2023 12:50 PM

## 2023-04-30 NOTE — Telephone Encounter (Signed)
 Pt called in stating that she woke up with her bed, underwear and legs soaked in sticky, odorless, clear fluid. The patient stated that the fluid is not urine, denies pain. Advised pt to be evaluated at the hospital.

## 2023-05-01 LAB — GC/CHLAMYDIA PROBE AMP (~~LOC~~) NOT AT ARMC
Chlamydia: NEGATIVE
Comment: NEGATIVE
Comment: NORMAL
Neisseria Gonorrhea: NEGATIVE

## 2023-05-13 ENCOUNTER — Telehealth: Payer: Self-pay | Admitting: *Deleted

## 2023-05-13 NOTE — Telephone Encounter (Signed)
 TC to f/u on Babyscripts notification for BP 129/92 with persistent HA and vision changes. Pt reports rechecked BP and systolic went up to 143. Had laid down to rest prior to that. Just took tylenol 20 min ago. Advised to drink a caffeinated beverage in combination with the tylenol for better relief. Advised given early gestation and recent BP's WNL, pre E is not likely. Advised to seek care in MAU if HA not relieved with tylenol, caffeine and resting in a dark, quite space.

## 2023-05-14 ENCOUNTER — Ambulatory Visit
Admission: RE | Admit: 2023-05-14 | Discharge: 2023-05-14 | Disposition: A | Discharge status: Home / Self Care | Source: Ambulatory Visit | Attending: Physician Assistant | Admitting: Physician Assistant

## 2023-05-14 DIAGNOSIS — N6452 Nipple discharge: Secondary | ICD-10-CM

## 2023-05-15 ENCOUNTER — Encounter: Admitting: Obstetrics and Gynecology

## 2023-05-20 ENCOUNTER — Encounter: Admitting: Obstetrics

## 2023-05-20 ENCOUNTER — Inpatient Hospital Stay (HOSPITAL_COMMUNITY)
Admission: EM | Admit: 2023-05-20 | Discharge: 2023-05-20 | Disposition: A | Discharge status: Home / Self Care | Attending: Obstetrics and Gynecology | Admitting: Obstetrics and Gynecology

## 2023-05-20 ENCOUNTER — Inpatient Hospital Stay (HOSPITAL_COMMUNITY)

## 2023-05-20 ENCOUNTER — Other Ambulatory Visit: Payer: Self-pay

## 2023-05-20 ENCOUNTER — Encounter (HOSPITAL_COMMUNITY): Payer: Self-pay

## 2023-05-20 DIAGNOSIS — O99892 Other specified diseases and conditions complicating childbirth: Secondary | ICD-10-CM | POA: Diagnosis not present

## 2023-05-20 DIAGNOSIS — Z3A14 14 weeks gestation of pregnancy: Secondary | ICD-10-CM | POA: Diagnosis not present

## 2023-05-20 DIAGNOSIS — O26892 Other specified pregnancy related conditions, second trimester: Secondary | ICD-10-CM | POA: Diagnosis not present

## 2023-05-20 DIAGNOSIS — E876 Hypokalemia: Secondary | ICD-10-CM

## 2023-05-20 DIAGNOSIS — M62830 Muscle spasm of back: Secondary | ICD-10-CM | POA: Diagnosis not present

## 2023-05-20 DIAGNOSIS — R109 Unspecified abdominal pain: Secondary | ICD-10-CM | POA: Diagnosis present

## 2023-05-20 LAB — URINALYSIS, ROUTINE W REFLEX MICROSCOPIC
Bacteria, UA: NONE SEEN
Bilirubin Urine: NEGATIVE
Glucose, UA: NEGATIVE mg/dL
Hgb urine dipstick: NEGATIVE
Ketones, ur: 5 mg/dL — AB
Leukocytes,Ua: NEGATIVE
Nitrite: NEGATIVE
Protein, ur: 30 mg/dL — AB
Specific Gravity, Urine: 1.017 (ref 1.005–1.030)
pH: 8 (ref 5.0–8.0)

## 2023-05-20 LAB — CBC WITH DIFFERENTIAL/PLATELET
Abs Immature Granulocytes: 0.03 10*3/uL (ref 0.00–0.07)
Basophils Absolute: 0 10*3/uL (ref 0.0–0.1)
Basophils Relative: 0 %
Eosinophils Absolute: 0.1 10*3/uL (ref 0.0–0.5)
Eosinophils Relative: 1 %
HCT: 35.6 % — ABNORMAL LOW (ref 36.0–46.0)
Hemoglobin: 12 g/dL (ref 12.0–15.0)
Immature Granulocytes: 0 %
Lymphocytes Relative: 20 %
Lymphs Abs: 1.6 10*3/uL (ref 0.7–4.0)
MCH: 27.2 pg (ref 26.0–34.0)
MCHC: 33.7 g/dL (ref 30.0–36.0)
MCV: 80.7 fL (ref 80.0–100.0)
Monocytes Absolute: 0.4 10*3/uL (ref 0.1–1.0)
Monocytes Relative: 4 %
Neutro Abs: 6.2 10*3/uL (ref 1.7–7.7)
Neutrophils Relative %: 75 %
Platelets: 254 10*3/uL (ref 150–400)
RBC: 4.41 MIL/uL (ref 3.87–5.11)
RDW: 15.4 % (ref 11.5–15.5)
WBC: 8.3 10*3/uL (ref 4.0–10.5)
nRBC: 0 % (ref 0.0–0.2)

## 2023-05-20 LAB — COMPREHENSIVE METABOLIC PANEL WITH GFR
ALT: 18 U/L (ref 0–44)
AST: 14 U/L — ABNORMAL LOW (ref 15–41)
Albumin: 2.9 g/dL — ABNORMAL LOW (ref 3.5–5.0)
Alkaline Phosphatase: 46 U/L (ref 38–126)
Anion gap: 9 (ref 5–15)
BUN: 5 mg/dL — ABNORMAL LOW (ref 6–20)
CO2: 22 mmol/L (ref 22–32)
Calcium: 8.7 mg/dL — ABNORMAL LOW (ref 8.9–10.3)
Chloride: 105 mmol/L (ref 98–111)
Creatinine, Ser: 0.65 mg/dL (ref 0.44–1.00)
GFR, Estimated: >60 mL/min (ref >60)
Glucose, Bld: 90 mg/dL (ref 70–99)
Potassium: 3.4 mmol/L — ABNORMAL LOW (ref 3.5–5.1)
Sodium: 136 mmol/L (ref 135–145)
Total Bilirubin: 0.5 mg/dL (ref 0.0–1.2)
Total Protein: 6 g/dL — ABNORMAL LOW (ref 6.5–8.1)

## 2023-05-20 LAB — WET PREP, GENITAL
Clue Cells Wet Prep HPF POC: NONE SEEN
Sperm: NONE SEEN
Trich, Wet Prep: NONE SEEN
WBC, Wet Prep HPF POC: >=10 — AB (ref <10)
Yeast Wet Prep HPF POC: NONE SEEN

## 2023-05-20 MED ORDER — LACTATED RINGERS IV BOLUS
1000.0000 mL | Freq: Once | INTRAVENOUS | Status: AC
Start: 2023-05-20 — End: 2023-05-20
  Administered 2023-05-20: 1000 mL via INTRAVENOUS

## 2023-05-20 MED ORDER — ONDANSETRON HCL 4 MG/2ML IJ SOLN
4.0000 mg | Freq: Once | INTRAMUSCULAR | Status: AC
Start: 2023-05-20 — End: 2023-05-20
  Administered 2023-05-20: 4 mg via INTRAVENOUS
  Filled 2023-05-20: qty 2

## 2023-05-20 MED ORDER — ACETAMINOPHEN 500 MG PO TABS
1000.0000 mg | ORAL_TABLET | Freq: Once | ORAL | Status: AC
  Administered 2023-05-20: 1000 mg via ORAL
  Filled 2023-05-20: qty 2

## 2023-05-20 MED ORDER — CYCLOBENZAPRINE HCL 10 MG PO TABS: 10.0000 mg | ORAL_TABLET | Freq: Three times a day (TID) | ORAL | 0 refills | Status: DC | PRN

## 2023-05-20 MED ORDER — CYCLOBENZAPRINE HCL 5 MG PO TABS
10.0000 mg | ORAL_TABLET | Freq: Once | ORAL | Status: AC
  Administered 2023-05-20: 10 mg via ORAL
  Filled 2023-05-20: qty 2

## 2023-05-20 NOTE — ED Triage Notes (Addendum)
 Pt states that she is [redacted] weeks pregnant with right flank pain and abd pain that is wrapping around the abdomen. 10/10 pain

## 2023-05-20 NOTE — MAU Provider Note (Signed)
 History     CSN: [id number]  Arrival date and time: 05/20/23 0545   Event Date/Time   First Provider Initiated Contact with Patient 05/20/2023  6:52 AM   Chief Complaint  Patient presents with   Abdominal Pain   Flank Pain    HPI  Taylor Buck is a 33 y.o. [id number] at [redacted]w[redacted]d who presents to the MAU for right flank pain. Reports acute onset around 0600 this AM. Describes as right flank pain that is crampy and intermittent and goes into suprapubic area. Denies abnormal vaginal discharge, vaginal odor, or bleeding. She denies dysuria. Has known hx nephrolithiasis, but unsure what her presenting symptoms were at the time, so unsure if current symptoms are similar to what she experienced in past. Endorses loose stools yesterday. No fevers, chills, sick contacts. No injury/trauma.  Past Medical History:  Diagnosis Date   Anxiety    Arthritis    Asthma    Depression    Heart murmur    Infection    chlamydia age 42   PCOS (polycystic ovarian syndrome)    Sleep apnea    Urinary tract infection     Past Surgical History:  Procedure Laterality Date   HAND SURGERY     reconstruction- injury due to frostbite as child   HERNIA REPAIR     rt inguinal    Family History  Problem Relation Age of Onset   COPD Mother    Diabetes Maternal Aunt    Cancer Maternal Aunt        breast   Diabetes Maternal Grandmother    Cancer Maternal Grandmother        skin cancer   Alcoholism Other    Arthritis Other    Asthma Other    Depression Other    Hearing loss Neg Hx     Social History   Tobacco Use   Smoking status: Former    Current packs/day: 0.00    Types: Cigarettes    Quit date: 10/2022    Years since quitting: 0.5   Smokeless tobacco: Never   Tobacco comments:    1 PACK A DAY - 4 CIGARETTES A DAY   Vaping Use   Vaping status: Never Used  Substance Use Topics   Alcohol use: No   Drug use: Not Currently    Types: Marijuana    Comment: UP UNTIL +UPT     Allergies:  Allergies  Allergen Reactions   Milk-Related Compounds Anaphylaxis   Latex Hives   Penicillins Hives    Has patient had a PCN reaction causing immediate rash, facial/tongue/throat swelling, SOB or lightheadedness with hypotension: Yes Has patient had a PCN reaction causing severe rash involving mucus membranes or skin necrosis: No Has patient had a PCN reaction that required hospitalization: No Has patient had a PCN reaction occurring within the last 10 years: No If all of the above answers are "NO", then may proceed with Cephalosporin use.    Phenergan  [Promethazine  Hcl]     Panic attack   Reglan  [Metoclopramide ] Other (See Comments)    Panic attack    No medications prior to admission.    ROS reviewed and pertinent positives and negatives as documented in HPI.  Physical Exam   Blood pressure (!) 95/47, pulse (!) 58, temperature 98.2 F (36.8 C), temperature source Oral, resp. rate 18, height 5\' 8"  (1.727 m), weight 118.8 kg, last menstrual period 02/06/2023, SpO2 98%.  Physical Exam Constitutional:      General: She is  not in acute distress.    Appearance: Normal appearance. She is not ill-appearing.     Comments: Uncomfortable, tearful  HENT:     Head: Normocephalic and atraumatic.  Cardiovascular:     Rate and Rhythm: Normal rate.  Pulmonary:     Effort: Pulmonary effort is normal.     Breath sounds: Normal breath sounds.  Abdominal:     Palpations: Abdomen is soft.     Tenderness: There is no abdominal tenderness. There is right CVA tenderness (mild). There is no left CVA tenderness, guarding or rebound. Negative signs include Murphy's sign.  Musculoskeletal:        General: Normal range of motion.     Comments: +right lumbar paraspinal muscle tenderness to palpation  Skin:    General: Skin is warm and dry.     Findings: No rash.  Neurological:     General: No focal deficit present.     Mental Status: She is alert and oriented to person, place,  and time.   BSUS with +FCA at 145bpm, active fetus  MAU Course  Procedures  MDM 33 y.o. [id number] at [redacted]w[redacted]d presenting for right sided flank pain starting acutely at 0600, no systemic symptoms or VB. Vital signs are stable, and exam notable for mild right CVA tenderness. Ddx includes kidney stones, muscle spasm, pyelonephritis, UTI, BV/yeast, round ligament pain. Will give dose of Tylenol , Flexeril , IV fluids, obtain labs/UA/swabs, and U/S and re-eval.  1158 Patient re-evaluated -- reports pain is now 3/10 (previously rating at 8/10) after Tylenol , Flexeril , 1L LR. Labs back -- UA neg, CBC wnl, CMP w mild hypokalemia (K 3.4), wet prep negative. U/S w/o evidence of nephrolithiasis. At this time, suspect pain 2/2 muscle spasm given improvement w Tylenol , Flexeril . Will d/c home w return precautions.  Assessment and Plan     ICD-10-CM   1. Muscle spasm of back  M62.830     2. Hypokalemia  E87.6     Rx for Flexeril  sent Given info on high potassium diet Stable for d/c  Melanie Spires, MD OB Fellow, Faculty Practice [facility], Center for [facility]  05/20/2023, 12:32 PM

## 2023-05-20 NOTE — MAU Note (Signed)
 Taylor Buck is a 33 y.o. at [redacted]w[redacted]d here in MAU reporting: pt transferred from ED. very bad pain in rt lower back, started last night at 6.  Pain comes and goes, when it is really bad, it hurts in the abd. Dull when not bad, squeezing pulling when really bad.  Has hx of kidney stones, doesn't remember how it felt. Denies bleeding. Loose stools yesterday. Denies fever. No urinary symptoms.  Onset of complaint: 1800 yesterday Pain score: 6 up to an 8, abd cramps when at its worse Vitals:   05/20/23 0559 05/20/23 0726  BP: 128/86 102/68  Pulse: 90 66  Resp: 15 20  Temp: 97.9 F (36.6 C)   SpO2: 99% 99%      Lab orders placed from triage:  urine

## 2023-05-21 LAB — GC/CHLAMYDIA PROBE AMP (~~LOC~~) NOT AT ARMC
Chlamydia: NEGATIVE
Comment: NEGATIVE
Comment: NORMAL
Neisseria Gonorrhea: NEGATIVE

## 2023-05-26 ENCOUNTER — Telehealth: Payer: Self-pay | Admitting: *Deleted

## 2023-05-26 NOTE — Telephone Encounter (Signed)
 TC to f/u on Babyscripts alert for elevated BP. Pt reports taking BP frequently because she is not feeling well. BP's 130's/80s. Advised pt to take BP only one time per day, consistent time of day, at rest for at least 10 min and record them in Babyscripts app. Advised to seek care for HA unrelieved by Tylenol , vision changes, or dizziness. Advised to bring BP cuff to next appt to verify readings are consistent with office BP cuff. Pt verbalized understanding.

## 2023-05-27 ENCOUNTER — Encounter: Payer: Self-pay | Admitting: Physician Assistant

## 2023-05-27 DIAGNOSIS — N6452 Nipple discharge: Secondary | ICD-10-CM | POA: Insufficient documentation

## 2023-05-29 ENCOUNTER — Ambulatory Visit (INDEPENDENT_AMBULATORY_CARE_PROVIDER_SITE_OTHER): Admitting: Obstetrics and Gynecology

## 2023-05-29 ENCOUNTER — Encounter: Admitting: Physician Assistant

## 2023-05-29 ENCOUNTER — Encounter: Payer: Self-pay | Admitting: Obstetrics and Gynecology

## 2023-05-29 VITALS — BP 115/71 | HR 92 | Wt 261.4 lb

## 2023-05-29 DIAGNOSIS — F4310 Post-traumatic stress disorder, unspecified: Secondary | ICD-10-CM

## 2023-05-29 DIAGNOSIS — O0992 Supervision of high risk pregnancy, unspecified, second trimester: Secondary | ICD-10-CM | POA: Diagnosis not present

## 2023-05-29 DIAGNOSIS — Z3A16 16 weeks gestation of pregnancy: Secondary | ICD-10-CM | POA: Diagnosis not present

## 2023-05-29 DIAGNOSIS — F319 Bipolar disorder, unspecified: Secondary | ICD-10-CM

## 2023-05-29 DIAGNOSIS — Z8751 Personal history of pre-term labor: Secondary | ICD-10-CM

## 2023-05-29 DIAGNOSIS — G4733 Obstructive sleep apnea (adult) (pediatric): Secondary | ICD-10-CM

## 2023-05-29 DIAGNOSIS — F17210 Nicotine dependence, cigarettes, uncomplicated: Secondary | ICD-10-CM | POA: Diagnosis not present

## 2023-05-29 DIAGNOSIS — R7309 Other abnormal glucose: Secondary | ICD-10-CM

## 2023-05-29 DIAGNOSIS — A5901 Trichomonal vulvovaginitis: Secondary | ICD-10-CM

## 2023-05-29 DIAGNOSIS — E282 Polycystic ovarian syndrome: Secondary | ICD-10-CM

## 2023-05-29 DIAGNOSIS — O0990 Supervision of high risk pregnancy, unspecified, unspecified trimester: Secondary | ICD-10-CM

## 2023-05-29 DIAGNOSIS — Z87898 Personal history of other specified conditions: Secondary | ICD-10-CM

## 2023-05-29 DIAGNOSIS — Z141 Cystic fibrosis carrier: Secondary | ICD-10-CM

## 2023-05-29 DIAGNOSIS — N6452 Nipple discharge: Secondary | ICD-10-CM

## 2023-05-29 DIAGNOSIS — O99342 Other mental disorders complicating pregnancy, second trimester: Secondary | ICD-10-CM

## 2023-05-29 DIAGNOSIS — Z6841 Body Mass Index (BMI) 40.0 and over, adult: Secondary | ICD-10-CM | POA: Insufficient documentation

## 2023-05-29 MED ORDER — ARIPIPRAZOLE 10 MG PO TABS: 10.0000 mg | ORAL_TABLET | Freq: Every day | ORAL | 11 refills | Status: DC

## 2023-05-29 MED ORDER — NICOTINE 21 MG/24HR TD PT24
MEDICATED_PATCH | TRANSDERMAL | 0 refills | Status: DC
Start: 2023-05-29 — End: 2023-05-29

## 2023-05-29 MED ORDER — NICOTINE 21 MG/24HR TD PT24: MEDICATED_PATCH | TRANSDERMAL | 1 refills | Status: DC

## 2023-05-29 NOTE — Assessment & Plan Note (Signed)
 Discussed 5/1 [ ]  give partner test kit

## 2023-05-29 NOTE — Assessment & Plan Note (Signed)
 Spontaneous labor and preterm birth at 70 weeks Term delivery on 17-OHP On vaginal progesterone  [ ]  cervical length with anatomy?

## 2023-05-29 NOTE — Progress Notes (Signed)
 Would like to discuss smoking cessation options. Does NOT wish to discuss this in front of husband.  No other concerns at this time.

## 2023-05-29 NOTE — Patient Instructions (Signed)
 [facility]   [address] [place], [place] [address] [phone] or [phone]   [facility] 24/7 FOR ANYONE  [address], [place], [place]  [phone]  Fax: [phone] [email or web address]  *Interpreters available  *Accepts all insurance and uninsured for Urgent Care needs  *Accepts Medicaid and uninsured for outpatient treatment (below)   Outpatient New Patient Assessment/Therapy Walk-ins:        Monday -Thursday 8am until slots are full.        Every Friday 1pm-4pm  (first come, first served)                    New Patient Psychiatry/Medication Management         Monday-Friday 8am-11am (first come, first served)              For all walk-ins we ask that you arrive by 7:15am, because patients will be seen in the order of arrival.
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
## 2023-06-01 ENCOUNTER — Other Ambulatory Visit: Payer: Self-pay

## 2023-06-01 ENCOUNTER — Emergency Department

## 2023-06-01 ENCOUNTER — Observation Stay
Admission: EM | Admit: 2023-06-01 | Discharge: 2023-06-03 | Disposition: A | Discharge status: Home / Self Care | Attending: Family Medicine | Admitting: Family Medicine

## 2023-06-01 DIAGNOSIS — Z79899 Other long term (current) drug therapy: Secondary | ICD-10-CM | POA: Diagnosis not present

## 2023-06-01 DIAGNOSIS — I82612 Acute embolism and thrombosis of superficial veins of left upper extremity: Secondary | ICD-10-CM | POA: Diagnosis not present

## 2023-06-01 DIAGNOSIS — Z7982 Long term (current) use of aspirin: Secondary | ICD-10-CM | POA: Insufficient documentation

## 2023-06-01 DIAGNOSIS — Z6841 Body Mass Index (BMI) 40.0 and over, adult: Secondary | ICD-10-CM | POA: Diagnosis not present

## 2023-06-01 DIAGNOSIS — O2230 Deep phlebothrombosis in pregnancy, unspecified trimester: Secondary | ICD-10-CM | POA: Diagnosis not present

## 2023-06-01 DIAGNOSIS — I82409 Acute embolism and thrombosis of unspecified deep veins of unspecified lower extremity: Secondary | ICD-10-CM | POA: Diagnosis not present

## 2023-06-01 DIAGNOSIS — H539 Unspecified visual disturbance: Secondary | ICD-10-CM

## 2023-06-01 DIAGNOSIS — Z3A16 16 weeks gestation of pregnancy: Secondary | ICD-10-CM

## 2023-06-01 DIAGNOSIS — I8290 Acute embolism and thrombosis of unspecified vein: Principal | ICD-10-CM

## 2023-06-01 DIAGNOSIS — Z87891 Personal history of nicotine dependence: Secondary | ICD-10-CM | POA: Diagnosis not present

## 2023-06-01 DIAGNOSIS — G459 Transient cerebral ischemic attack, unspecified: Secondary | ICD-10-CM | POA: Diagnosis present

## 2023-06-01 DIAGNOSIS — O99342 Other mental disorders complicating pregnancy, second trimester: Secondary | ICD-10-CM | POA: Insufficient documentation

## 2023-06-01 DIAGNOSIS — O99212 Obesity complicating pregnancy, second trimester: Secondary | ICD-10-CM | POA: Insufficient documentation

## 2023-06-01 DIAGNOSIS — F4310 Post-traumatic stress disorder, unspecified: Secondary | ICD-10-CM | POA: Diagnosis not present

## 2023-06-01 DIAGNOSIS — O99352 Diseases of the nervous system complicating pregnancy, second trimester: Principal | ICD-10-CM | POA: Insufficient documentation

## 2023-06-01 DIAGNOSIS — H547 Unspecified visual loss: Secondary | ICD-10-CM

## 2023-06-01 DIAGNOSIS — Z91040 Latex allergy status: Secondary | ICD-10-CM | POA: Diagnosis not present

## 2023-06-01 DIAGNOSIS — O99891 Other specified diseases and conditions complicating pregnancy: Secondary | ICD-10-CM

## 2023-06-01 DIAGNOSIS — O99512 Diseases of the respiratory system complicating pregnancy, second trimester: Secondary | ICD-10-CM | POA: Diagnosis not present

## 2023-06-01 DIAGNOSIS — R55 Syncope and collapse: Secondary | ICD-10-CM | POA: Diagnosis not present

## 2023-06-01 DIAGNOSIS — O2342 Unspecified infection of urinary tract in pregnancy, second trimester: Secondary | ICD-10-CM | POA: Diagnosis not present

## 2023-06-01 DIAGNOSIS — Z3A00 Weeks of gestation of pregnancy not specified: Secondary | ICD-10-CM

## 2023-06-01 DIAGNOSIS — Z1152 Encounter for screening for COVID-19: Secondary | ICD-10-CM | POA: Diagnosis not present

## 2023-06-01 DIAGNOSIS — E669 Obesity, unspecified: Secondary | ICD-10-CM | POA: Insufficient documentation

## 2023-06-01 DIAGNOSIS — J45909 Unspecified asthma, uncomplicated: Secondary | ICD-10-CM | POA: Diagnosis not present

## 2023-06-01 DIAGNOSIS — R519 Headache, unspecified: Secondary | ICD-10-CM

## 2023-06-01 DIAGNOSIS — O88212 Thromboembolism in pregnancy, second trimester: Secondary | ICD-10-CM | POA: Diagnosis not present

## 2023-06-01 LAB — COMPREHENSIVE METABOLIC PANEL WITH GFR
ALT: 24 U/L (ref 0–44)
AST: 21 U/L (ref 15–41)
Albumin: 3.4 g/dL — ABNORMAL LOW (ref 3.5–5.0)
Alkaline Phosphatase: 53 U/L (ref 38–126)
Anion gap: 11 (ref 5–15)
BUN: 10 mg/dL (ref 6–20)
CO2: 20 mmol/L — ABNORMAL LOW (ref 22–32)
Calcium: 9.3 mg/dL (ref 8.9–10.3)
Chloride: 105 mmol/L (ref 98–111)
Creatinine, Ser: 0.6 mg/dL (ref 0.44–1.00)
GFR, Estimated: >60 mL/min (ref >60)
Glucose, Bld: 104 mg/dL — ABNORMAL HIGH (ref 70–99)
Potassium: 3.4 mmol/L — ABNORMAL LOW (ref 3.5–5.1)
Sodium: 136 mmol/L (ref 135–145)
Total Bilirubin: 0.5 mg/dL (ref 0.0–1.2)
Total Protein: 7 g/dL (ref 6.5–8.1)

## 2023-06-01 LAB — URINALYSIS, ROUTINE W REFLEX MICROSCOPIC
Bilirubin Urine: NEGATIVE
Glucose, UA: NEGATIVE mg/dL
Hgb urine dipstick: NEGATIVE
Ketones, ur: NEGATIVE mg/dL
Nitrite: NEGATIVE
Protein, ur: NEGATIVE mg/dL
Specific Gravity, Urine: 1.014 (ref 1.005–1.030)
pH: 7 (ref 5.0–8.0)

## 2023-06-01 LAB — RESP PANEL BY RT-PCR (RSV, FLU A&B, COVID)  RVPGX2
Influenza A by PCR: NEGATIVE
Influenza B by PCR: NEGATIVE
Resp Syncytial Virus by PCR: NEGATIVE
SARS Coronavirus 2 by RT PCR: NEGATIVE

## 2023-06-01 LAB — CBG MONITORING, ED: Glucose-Capillary: 95 mg/dL (ref 70–99)

## 2023-06-01 LAB — CBC
HCT: 36.7 % (ref 36.0–46.0)
Hemoglobin: 12.3 g/dL (ref 12.0–15.0)
MCH: 26.5 pg (ref 26.0–34.0)
MCHC: 33.5 g/dL (ref 30.0–36.0)
MCV: 78.9 fL — ABNORMAL LOW (ref 80.0–100.0)
Platelets: 253 10*3/uL (ref 150–400)
RBC: 4.65 MIL/uL (ref 3.87–5.11)
RDW: 15.2 % (ref 11.5–15.5)
WBC: 8.4 10*3/uL (ref 4.0–10.5)
nRBC: 0 % (ref 0.0–0.2)

## 2023-06-01 LAB — TROPONIN I (HIGH SENSITIVITY): Troponin I (High Sensitivity): 3 ng/L (ref <18)

## 2023-06-01 LAB — DIFFERENTIAL
Abs Immature Granulocytes: 0.03 10*3/uL (ref 0.00–0.07)
Basophils Absolute: 0 10*3/uL (ref 0.0–0.1)
Basophils Relative: 0 %
Eosinophils Absolute: 0.1 10*3/uL (ref 0.0–0.5)
Eosinophils Relative: 1 %
Immature Granulocytes: 0 %
Lymphocytes Relative: 20 %
Lymphs Abs: 1.7 10*3/uL (ref 0.7–4.0)
Monocytes Absolute: 0.4 10*3/uL (ref 0.1–1.0)
Monocytes Relative: 5 %
Neutro Abs: 6.1 10*3/uL (ref 1.7–7.7)
Neutrophils Relative %: 74 %

## 2023-06-01 LAB — PROTIME-INR
INR: 1.1 (ref 0.8–1.2)
Prothrombin Time: 13.9 s (ref 11.4–15.2)

## 2023-06-01 LAB — ETHANOL: Alcohol, Ethyl (B): <15 mg/dL (ref <15)

## 2023-06-01 LAB — POC URINE PREG, ED: Preg Test, Ur: POSITIVE — AB

## 2023-06-01 LAB — APTT: aPTT: 27 s (ref 24–36)

## 2023-06-01 MED ORDER — ORAL CARE MOUTH RINSE: 15.0000 mL | OROMUCOSAL | Status: DC | PRN

## 2023-06-01 MED ORDER — ACETAMINOPHEN 325 MG PO TABS
650.0000 mg | ORAL_TABLET | Freq: Every day | ORAL | Status: DC | PRN
  Administered 2023-06-01: 650 mg via ORAL
  Filled 2023-06-01: qty 2

## 2023-06-01 MED ORDER — ENOXAPARIN SODIUM 120 MG/0.8ML IJ SOSY
1.0000 mg/kg | PREFILLED_SYRINGE | Freq: Two times a day (BID) | INTRAMUSCULAR | Status: DC
  Administered 2023-06-01: 117 mg via SUBCUTANEOUS
  Filled 2023-06-01 (×2): qty 0.78

## 2023-06-01 MED ORDER — NICOTINE 14 MG/24HR TD PT24
14.0000 mg | MEDICATED_PATCH | Freq: Every day | TRANSDERMAL | Status: DC | PRN
Start: 2023-06-01 — End: 2023-06-03

## 2023-06-01 MED ORDER — ACETAMINOPHEN 325 MG PO TABS
650.0000 mg | ORAL_TABLET | Freq: Four times a day (QID) | ORAL | Status: DC | PRN
  Administered 2023-06-02: 650 mg via ORAL
  Filled 2023-06-01: qty 2

## 2023-06-01 MED ORDER — SODIUM CHLORIDE 0.9 % IV BOLUS: 1000.0000 mL | Freq: Once | INTRAVENOUS | Status: DC

## 2023-06-01 MED ORDER — CEFUROXIME AXETIL 500 MG PO TABS
500.0000 mg | ORAL_TABLET | Freq: Two times a day (BID) | ORAL | Status: DC
  Administered 2023-06-01 – 2023-06-03 (×4): 500 mg via ORAL
  Filled 2023-06-01 (×5): qty 1

## 2023-06-01 MED ORDER — ADULT MULTIVITAMIN W/MINERALS CH
1.0000 | ORAL_TABLET | Freq: Every day | ORAL | Status: DC
Start: 2023-06-01 — End: 2023-06-03
  Administered 2023-06-01 – 2023-06-03 (×3): 1 via ORAL
  Filled 2023-06-01 (×3): qty 1

## 2023-06-01 MED ORDER — POLYETHYLENE GLYCOL 3350 17 G PO PACK: 17.0000 g | PACK | Freq: Every day | ORAL | Status: DC | PRN

## 2023-06-01 MED ORDER — SODIUM CHLORIDE 0.9 % IV SOLN
2.0000 g | Freq: Once | INTRAVENOUS | Status: AC
  Administered 2023-06-01: 2 g via INTRAVENOUS
  Filled 2023-06-01: qty 20

## 2023-06-01 MED ORDER — SODIUM CHLORIDE 0.9% FLUSH
3.0000 mL | Freq: Two times a day (BID) | INTRAVENOUS | Status: DC
  Administered 2023-06-01: 3 mL via INTRAVENOUS

## 2023-06-01 MED ORDER — SERTRALINE HCL 50 MG PO TABS: 25.0000 mg | ORAL_TABLET | Freq: Every day | ORAL | Status: DC

## 2023-06-01 MED ORDER — CALCIUM CARBONATE ANTACID 500 MG PO CHEW
750.0000 mg | CHEWABLE_TABLET | Freq: Every evening | ORAL | Status: DC | PRN
  Administered 2023-06-02 – 2023-06-03 (×2): 300 mg via ORAL
  Filled 2023-06-01 (×2): qty 2

## 2023-06-01 MED ORDER — ENOXAPARIN SODIUM 120 MG/0.8ML IJ SOSY
120.0000 mg | PREFILLED_SYRINGE | Freq: Two times a day (BID) | INTRAMUSCULAR | Status: DC
  Administered 2023-06-02 – 2023-06-03 (×3): 120 mg via SUBCUTANEOUS
  Filled 2023-06-01 (×5): qty 0.8

## 2023-06-01 MED ORDER — ARIPIPRAZOLE 10 MG PO TABS
10.0000 mg | ORAL_TABLET | Freq: Every day | ORAL | Status: DC
  Administered 2023-06-01 – 2023-06-02 (×2): 10 mg via ORAL
  Filled 2023-06-01 (×2): qty 1

## 2023-06-01 MED ORDER — ALBUTEROL SULFATE HFA 108 (90 BASE) MCG/ACT IN AERS: 2.0000 | INHALATION_SPRAY | RESPIRATORY_TRACT | Status: DC | PRN
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
## 2023-06-01 NOTE — ED Notes (Signed)
FHR 145

## 2023-06-01 NOTE — Progress Notes (Signed)
 Discussed briefly that Dr. Peggi Bowels  Pregnant woman presenting with a couple episodes of near syncope with crossed vision, headache  Recommended MRI brain, MRA head without contrast  These studies have resulted negative however subsequently she has been found to have DVT for which she is being started on anticoagulation.  As described  to me by Dr. Peggi Bowels the episodes do not sound concerning for TIA, but appreciate cardiopulmonary workup per ED.  Certainly given her negative MRI from a neurological perspective she can be safely anticoagulated and from a stroke risk perspective as long as she is anticoagulated that is maximal medical therapy for preventing stroke.  Defer to OB/GYN recommendations for anticoagulation in the setting of pregnancy  Inpatient neurology will be available as needed, please do not hesitate to reach out if acute neurological concerns arise  Dr. Peggi Bowels to notify patient of neurological interprofessional consultation  12 minutes spent in care of majority in discussion with Dr. Peggi Bowels
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
## 2023-06-01 NOTE — ED Provider Notes (Signed)
 [facility] Provider Note    Event Date/Time   First MD Initiated Contact with Patient 06/01/23 1234     (approximate)   History   Loss of Vision   HPI  Taylor Buck is a 33 y.o. female who is currently [redacted] weeks pregnant who comes in with concerns for vision loss.  Patient reports that she was driving last night and she felt like her vision cross and then lost vision in both eyes.  She did not lose consciousness.  She states it lasted 30 seconds to 45 seconds and then came back on.  She states that she called EMS and they told her to go to the emergency room but she did not want to go she went home and she fell asleep and woke up this morning around 3 AM.  She walked downstairs and she was feeling okay when she then developed the same symptoms symptoms at this time associated with an episode of vomiting.  She denies ever having anything like this before.  She states that she just feels a little off.  Does report a mild headache.  She denies any gush of fluids, vaginal bleeding, still feeling baby move.  She states that this is her fourth pregnancy.  She does report being under a lot of stress recently and she is not sure if this just be could be related to stress.  She reports about 2 weeks ago she came into the the hospital for some dehydration and got some IV fluids with an ultrasound IV on her left arm.  She reports afterwards having a lot of knots in her arm.  She still reports a little bit of pain in her left arm but states that most of it has resolved.  Physical Exam   Triage Vital Signs: ED Triage Vitals  Encounter Vitals Group     BP 06/01/23 1200 121/71     Systolic BP Percentile --      Diastolic BP Percentile --      Pulse Rate 06/01/23 1200 100     Resp 06/01/23 1200 17     Temp 06/01/23 1200 98.2 F (36.8 C)     Temp src --      SpO2 06/01/23 1200 99 %     Weight 06/01/23 1157 260 lb (117.9 kg)     Height 06/01/23 1157 5\' 8"   (1.727 m)     Head Circumference --      Peak Flow --      Pain Score 06/01/23 1155 2     Pain Loc --      Pain Education --      Exclude from Growth Chart --     Most recent vital signs: Vitals:   06/01/23 1301 06/01/23 1302  BP: 129/69 112/67  Pulse: 73 86  Resp: 18 (!) 21  Temp:    SpO2: 100% 100%     General: Awake, no distress.  CV:  Good peripheral perfusion.  Resp:  Normal effort.  Abd:  No distention.  Soft nontender Other:  Cn Nerves II to XII are intact.  Equal strength in arms and legs.  Finger-to-nose intact bilaterally. Good distal pulse of the left arm.  No obvious swelling noted.  She reports a little bit of pain but no obvious knots felt.  ED Results / Procedures / Treatments   Labs (all labs ordered are listed, but only abnormal results are displayed) Labs Reviewed  CBC - Abnormal; Notable for  the following components:      Result Value   MCV 78.9 (*)    All other components within normal limits  COMPREHENSIVE METABOLIC PANEL WITH GFR - Abnormal; Notable for the following components:   Potassium 3.4 (*)    CO2 20 (*)    Glucose, Bld 104 (*)    Albumin 3.4 (*)    All other components within normal limits  URINALYSIS, ROUTINE W REFLEX MICROSCOPIC - Abnormal; Notable for the following components:   Color, Urine YELLOW (*)    APPearance TURBID (*)    Leukocytes,Ua MODERATE (*)    Bacteria, UA RARE (*)    All other components within normal limits  POC URINE PREG, ED - Abnormal; Notable for the following components:   Preg Test, Ur Positive (*)    All other components within normal limits  RESP PANEL BY RT-PCR (RSV, FLU A&B, COVID)  RVPGX2  PROTIME-INR  APTT  DIFFERENTIAL  ETHANOL  CBG MONITORING, ED  TROPONIN I (HIGH SENSITIVITY)     EKG  My interpretation of EKG:  Normal sinus rate of 62 without any ST elevation, T wave inversion in lead III, normal intervals  RADIOLOGY I have reviewed the ultrasound personally and interpreted and  positive clot   PROCEDURES:  Critical Care performed: No  .1-3 Lead EKG Interpretation  Performed by: Lubertha Rush, MD Authorized by: Lubertha Rush, MD     Interpretation: normal     ECG rate:  80   ECG rate assessment: normal     Rhythm: sinus rhythm     Ectopy: none     Conduction: normal   .Critical Care  Performed by: Lubertha Rush, MD Authorized by: Lubertha Rush, MD   Critical care provider statement:    Critical care time (minutes):  30   Critical care was necessary to treat or prevent imminent or life-threatening deterioration of the following conditions: DVT.   Critical care was time spent personally by me on the following activities:  Development of treatment plan with patient or surrogate, discussions with consultants, evaluation of patient's response to treatment, examination of patient, ordering and review of laboratory studies, ordering and review of radiographic studies, ordering and performing treatments and interventions, pulse oximetry, re-evaluation of patient's condition and review of old charts    MEDICATIONS ORDERED IN ED: Medications  sodium chloride  0.9 % bolus 1,000 mL (0 mLs Intravenous Hold 06/01/23 1324)     IMPRESSION / MDM / ASSESSMENT AND PLAN / ED COURSE  I reviewed the triage vital signs and the nursing notes.   Patient's presentation is most consistent with acute presentation with potential threat to life or bodily function.   Differential includes TIA, stroke, aneurysm, dehydration, UTI, electrolyte abnormalities.  Headache is mild not severe sudden onset so seems less likely.  She only reports a mild headache that was not associated with when the symptoms were going on.  Orthostatics were negative.  Coags are normal.  CBC reassuring CMP shows some slight dehydration with low bicarb, low potassium alcohol negative troponin negative urine without any ketones.  Not a great sample will send for culture but given the significant amount of WBCs  probably best to treat for possible UTI given patient is pregnant.  In case this was a TIA that would need observation admission discussed the case with neurology Dr. Cleone Dad-   Neurology did not feel like this needed to be admitted did recommend getting MRIs and if there is anything concerning on  the MRIs that we can rediscuss.  Offered to do IV fluids for patient but patient declined stating that she would rather do PO  Discussed with neurology patient's MRIs were negative and she was okay with patient starting anticoagulation for clots in the arm.  I discussed with vascular Dr Charlotte Cookey and these are technically superficial veins but given the extent of them would be reasonable to do some Lovenox.  He is going to take a look at the ultrasound.  I discussed with patient and hold off on CT PE given radiation exposure no overt shortness of breath but I do think patient would benefit from admission for monitoring response to Lovenox given she is pregnant, echocardiogram of her heart given the episodes, monitoring her for another episode.  Patient also felt more comfortable with this plan.  I discussed case with Dr. Cleora Daft who did recommend daily fetal heart tones but given she is under 20 weeks and this is a medical issue patient should be admitted to the hospitalist and the hospitalist can consult on as needed. Okay with lovenox for patient.      The patient is on the cardiac monitor to evaluate for evidence of arrhythmia and/or significant heart rate changes.      FINAL CLINICAL IMPRESSION(S) / ED DIAGNOSES   Final diagnoses:  Thrombosis  Vision loss  [redacted] weeks gestation of pregnancy     Rx / DC Orders   ED Discharge Orders     None        Note:  This document was prepared using Dragon voice recognition software and may include unintentional dictation errors.   Lubertha Rush, MD 06/01/23 [phone]

## 2023-06-01 NOTE — Consult Note (Addendum)
 PHARMACY - ANTICOAGULATION CONSULT NOTE  Pharmacy Consult for Lovenox Indication: DVT -  Thrombus in the left cephalic and basilic veins  Allergies  Allergen Reactions   Milk-Related Compounds Anaphylaxis   Latex Hives   Penicillins Hives    Has patient had a PCN reaction causing immediate rash, facial/tongue/throat swelling, SOB or lightheadedness with hypotension: Yes Has patient had a PCN reaction causing severe rash involving mucus membranes or skin necrosis: No Has patient had a PCN reaction that required hospitalization: No Has patient had a PCN reaction occurring within the last 10 years: No If all of the above answers are "NO", then may proceed with Cephalosporin use.    Phenergan  [Promethazine  Hcl]     Panic attack   Reglan  [Metoclopramide ] Other (See Comments)    Panic attack    Patient Measurements: Height: 5\' 8"  (172.7 cm) Weight: 117.9 kg (260 lb) IBW/kg (Calculated) : 63.9 HEPARIN DW (KG): 91.3  Vital Signs: Temp: 98.2 F (36.8 C) (05/04 1200) BP: 112/67 (05/04 1302) Pulse Rate: 86 (05/04 1302)  Labs: Recent Labs    06/01/23 1203  HGB 12.3  HCT 36.7  PLT 253  APTT 27  LABPROT 13.9  INR 1.1  CREATININE 0.60  TROPONINIHS 3    Estimated Creatinine Clearance: 135 mL/min (by C-G formula based on SCr of 0.6 mg/dL).   Medical History: Past Medical History:  Diagnosis Date   Anxiety    Arthritis    Asthma    Depression    Heart murmur    Infection    chlamydia age 51   PCOS (polycystic ovarian syndrome)    Sleep apnea    Urinary tract infection     Medications:  (Not in a hospital admission)   Assessment: 33YO female currently [redacted] weeks pregnant presnets to ED with concerns for vision loss. PMH includes preterm delivery; Bipolar 1 disorder ; History of depression; hx of substance abuse; Trichomonas vaginitis; Asthma,and PTSD. Imagine shows " Thrombus in the left cephalic and basilic veins:"   Pharmacy consulted to dose enoxaparin for LUE  clot in pregnancy.   Goal of Therapy:  Anti-Xa level 0.5-1 units/ml 4hrs after LMWH dose given (peak) Monitor platelets by anticoagulation protocol: Yes   Plan:  Lovenox 1mg /kg (117mg ; round to 120mg ) [place] q 12 hours Plan to check anti-Xa 4 hours after 3rd dose (steady state) CBC every 72 hours Pharmacy to complete teaching prior to discharge  Jafari Mckillop Rodriguez-Guzman PharmD, BCPS 06/01/2023 3:49 PM

## 2023-06-02 ENCOUNTER — Other Ambulatory Visit: Payer: Self-pay

## 2023-06-02 DIAGNOSIS — I8290 Acute embolism and thrombosis of unspecified vein: Secondary | ICD-10-CM | POA: Diagnosis not present

## 2023-06-02 DIAGNOSIS — H547 Unspecified visual loss: Secondary | ICD-10-CM | POA: Diagnosis not present

## 2023-06-02 DIAGNOSIS — Z3A16 16 weeks gestation of pregnancy: Secondary | ICD-10-CM | POA: Diagnosis not present

## 2023-06-02 LAB — CBC
HCT: 35.2 % — ABNORMAL LOW (ref 36.0–46.0)
Hemoglobin: 11.8 g/dL — ABNORMAL LOW (ref 12.0–15.0)
MCH: 26.8 pg (ref 26.0–34.0)
MCHC: 33.5 g/dL (ref 30.0–36.0)
MCV: 80 fL (ref 80.0–100.0)
Platelets: 240 10*3/uL (ref 150–400)
RBC: 4.4 MIL/uL (ref 3.87–5.11)
RDW: 15.3 % (ref 11.5–15.5)
WBC: 7.7 10*3/uL (ref 4.0–10.5)
nRBC: 0 % (ref 0.0–0.2)

## 2023-06-02 LAB — BASIC METABOLIC PANEL WITH GFR
Anion gap: 12 (ref 5–15)
BUN: 9 mg/dL (ref 6–20)
CO2: 20 mmol/L — ABNORMAL LOW (ref 22–32)
Calcium: 8.9 mg/dL (ref 8.9–10.3)
Chloride: 105 mmol/L (ref 98–111)
Creatinine, Ser: 0.56 mg/dL (ref 0.44–1.00)
GFR, Estimated: >60 mL/min (ref >60)
Glucose, Bld: 93 mg/dL (ref 70–99)
Potassium: 3.6 mmol/L (ref 3.5–5.1)
Sodium: 137 mmol/L (ref 135–145)

## 2023-06-02 LAB — PROTIME-INR
INR: 1.1 (ref 0.8–1.2)
Prothrombin Time: 14.5 s (ref 11.4–15.2)

## 2023-06-02 LAB — HIV ANTIBODY (ROUTINE TESTING W REFLEX): HIV Screen 4th Generation wRfx: NONREACTIVE

## 2023-06-02 LAB — GLUCOSE, CAPILLARY: Glucose-Capillary: 103 mg/dL — ABNORMAL HIGH (ref 70–99)

## 2023-06-02 MED ORDER — ENOXAPARIN SODIUM 120 MG/0.8ML IJ SOSY: 120.0000 mg | PREFILLED_SYRINGE | Freq: Two times a day (BID) | INTRAMUSCULAR | 0 refills | Status: DC

## 2023-06-02 MED ORDER — PROGESTERONE MICRONIZED 100 MG PO CAPS
200.0000 mg | ORAL_CAPSULE | Freq: Every day | ORAL | Status: DC
  Administered 2023-06-02: 200 mg via VAGINAL
  Filled 2023-06-02: qty 2

## 2023-06-02 NOTE — Discharge Summary (Signed)
 Physician Discharge Summary   Patient: Taylor Buck MRN: [id number] DOB: 1990/02/25  Admit date:     06/01/2023  Discharge date: {dischdate:26783}  Discharge Physician: Roise Cleaver   PCP: Davis, Devon E, PA-C   Recommendations at discharge:  {Tip this will not be part of the note when signed- Example include specific recommendations for outpatient follow-up, pending tests to follow-up on. (Optional):26781}  ***  Discharge Diagnoses: Principal Problem:   TIA (transient ischemic attack)  Resolved Problems:   * No resolved hospital problems. *  Hospital Course: Taylor Buck is a 33 year old female with history of migraine headaches, anxiety, depression, OSA, PCOS, high risk pregnancy (G5 P1122), currently [redacted]w[redacted]d who presented to the ED with 2 episodes of sudden onset vision loss.  The first episode occurred while driving when patient experienced a transient blackout.  Symptoms lasted over 1 minute and had complete resolution.  Second episode occurred in the morning with an associated dizziness.  Patient does have extensive history of migraine headaches, and while she was not having headaches at time of her vision loss she does endorse that she had been having headaches all day prior.   Patient also complained of left upper extremity swelling after receiving IV fluids 2 weeks ago.  Ultrasound in the ED confirmed thrombus in left cephalic and basilic veins.  MRI was performed which was negative for acute CVA.   Patient was admitted for further workup.  Care plan was discussed with OB/GYN, hematology, neurology.   Given extensiveness of superficial thrombus decision was made to treat this as a DVT.  As such patient will require Lovenox for the duration of her pregnancy, and 6 weeks postpartum.  I have discussed this extensively with the patient, OB/GYN, hematology who are all in agreement with the plan.  Patient already follows with high risk OB and will need to  see them in the clinic next week.  Transient vision loss - Etiology unclear.  Potentially complex migraine. - MRI neg for CVA and any LVO - Neurology has been consulted and agrees with plan for anticoagulation maximum stroke prevention.   Left upper extremity venous thrombus in cephalic and basilic veins - Given extensiveness of clot this will be treated as DVT in pregnancy -- patient has been initiated on Lovenox, safe for pregnancy - Have ordered antiphospholipid antibodies -- Anticipate patient will require Lovenox for the entirety of her pregnancy and 6 weeks postpartum as coagulation risk will remain high   Second trimester high risk pregnancy - Prior preterm deliveries, 2 prior spontaneous abortions - Follows with OB/GYN closely - OB/GYN has been consulted during this admission -- Has been taking aspirin , will cont with lovenox only - Have ordered antiphospholipid antibodies as above - FHT 140s - As planned for anatomy scan 5/29 outpatient   Posttraumatic stress disorder Bipolar disorder Anxiety Depression - Has had hospitalizations for manic episodes in the past.  Currently does not follow with psychiatry - Was previously referred to [facility] behavioral [facility].  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 , ,  (Optional):26781}  {(NOTE) Pain control PDMP Statment (Optional):26782} Consultants: Neurology, Ob/GYN. Hematology  Procedures performed: n/a   Disposition: Home Diet recommendation:  Regular diet DISCHARGE MEDICATION: Allergies as of 06/02/2023       Reactions   Milk (cow) Anaphylaxis   Milk-related Compounds Anaphylaxis   Lorazepam Other (See Comments)   PANIC   Latex Hives   Penicillins Hives   Has patient had a PCN reaction causing immediate rash, facial/tongue/throat swelling, SOB or lightheadedness with hypotension: Yes Has patient had a PCN reaction causing severe rash involving mucus membranes or skin necrosis: No Has patient had a PCN reaction that required hospitalization: No Has patient had a PCN reaction occurring within the last 10 years: No If all of the above answers are "NO", then may proceed with Cephalosporin use.   Phenergan  [promethazine  Hcl]    Panic attack   Reglan  [metoclopramide ] Other (See Comments)   Panic attack     Med Rec must be completed prior to using this SMARTLINK***       Follow-up Information     Kirstie Percy E, PA-C Follow up.   Specialties: Physician Assistant, Pediatrics Why: Hospital follow up Contact information: [address] [place] [address] [phone]         Verlyn Goad, MD .   Specialty: Obstetrics and Gynecology Contact information: [address] [place] [address] [phone]                Discharge Exam: Cleavon Curls Weights   06/01/23 1157 06/01/23 2047 06/02/23 0429  Weight: 117.9 kg 117.4 kg 118 kg   ***  Condition at discharge: {DC Condition:26389}  The results of significant diagnostics from this hospitalization (including imaging, microbiology, ancillary and laboratory) are listed below for reference.   Imaging Studies: MR ANGIO HEAD WO CONTRAST Result Date: 06/01/2023 CLINICAL DATA:  Episodes with temporary loss of vision loss lasting 30-45 seconds. Vision has since normalized. EXAM: MRA HEAD WITHOUT CONTRAST TECHNIQUE: Angiographic images of the Circle of Willis were acquired using MRA technique  without intravenous contrast. COMPARISON:  None Available. FINDINGS: Anterior circulation: The internal carotid arteries are within normal limits from high cervical segments through the ICA termini. The A1 and M1 segments are normal. The anterior communicating artery is patent. The MCA bifurcations are normal bilaterally. The ACA and MCA branch vessels are normal bilaterally. No aneurysm is present. Posterior circulation: The vertebral arteries are codominant. The right PICA origin is just below the field of view. Left AICA is dominant and normal. The vertebrobasilar junction basilar artery normal. The superior cerebellar arteries are patent bilaterally. Both posterior cerebral arteries originate from the basilar tip. The PCA branch vessels are normal bilaterally. No aneurysm is present. Anatomic variants: None IMPRESSION: Normal MRA Circle of Willis without significant proximal stenosis, aneurysm, or branch vessel occlusion. Electronically Signed   By: Audree Leas M.D.   On: 06/01/2023 15:01   MR BRAIN WO CONTRAST Result Date: 06/01/2023 CLINICAL DATA:  Headache. Transient episodes of vision loss. Vision has since normalized. EXAM: MRI HEAD WITHOUT CONTRAST TECHNIQUE: Multiplanar, multiecho pulse sequences of the brain and surrounding structures were obtained without intravenous contrast. COMPARISON:  None Available. FINDINGS: Brain: No acute infarct, hemorrhage, or mass lesion is present. No significant white matter lesions are present. Deep brain nuclei are within normal limits. The ventricles are of normal size. No  significant extraaxial fluid collection is present. The brainstem and cerebellum are within normal limits. The internal auditory canals are within normal limits. Midline structures are within normal limits. Vascular: Flow is present in the major intracranial arteries. Skull and upper cervical spine: The craniocervical junction is normal. Upper cervical spine is within normal limits. Marrow  signal is unremarkable. Sinuses/Orbits: The paranasal sinuses and mastoid air cells are clear. The globes and orbits are within normal limits. IMPRESSION: Normal MRI appearance of the brain. No acute or focal lesion to explain the patient's symptoms. Electronically Signed   By: Audree Leas M.D.   On: 06/01/2023 14:58   US  Venous Img Upper Uni Left Result Date: 06/01/2023 CLINICAL DATA:  Left upper extremity pain and swelling following IV placement. EXAM: Left UPPER EXTREMITY VENOUS DOPPLER ULTRASOUND TECHNIQUE: Gray-scale sonography with graded compression, as well as color Doppler and duplex ultrasound were performed to evaluate the upper extremity deep venous system from the level of the subclavian vein and including the jugular, axillary, basilic, radial, ulnar and upper cephalic vein. Spectral Doppler was utilized to evaluate flow at rest and with distal augmentation maneuvers. COMPARISON:  None Available. FINDINGS: Internal Jugular Vein: No evidence of thrombus. Normal compressibility, respiratory phasicity and response to augmentation. Subclavian Vein: No evidence of thrombus. Normal compressibility, respiratory phasicity and response to augmentation. Axillary Vein: No evidence of thrombus. Normal compressibility, respiratory phasicity and response to augmentation. Cephalic Vein: The cephalic vein does not compress or demonstrate augmentation. Thrombus is present. Basilic Vein: The basilic vein does not compress or demonstrate augmentation. Thrombus present. Brachial Veins: No evidence of thrombus. Normal compressibility, respiratory phasicity and response to augmentation. Radial Veins: No evidence of thrombus. Normal compressibility, respiratory phasicity and response to augmentation. Ulnar Veins: No evidence of thrombus. Normal compressibility, respiratory phasicity and response to augmentation. Venous Reflux:  None visualized. Other Findings:  None visualized. IMPRESSION: 1. Thrombus in the left  cephalic and basilic veins. Critical Value/emergent results were called by telephone at the time of interpretation on 06/01/2023 at 2:33 pm to provider [facility] , who verbally acknowledged these results. Electronically Signed   By: Audree Leas M.D.   On: 06/01/2023 14:36   US  RENAL Result Date: 05/20/2023 CLINICAL DATA:  Acute right flank pain.  Fourteen weeks pregnant. EXAM: RENAL / URINARY TRACT ULTRASOUND COMPLETE COMPARISON:  None available FINDINGS: Right Kidney: Renal measurements: 11.7 cm = volume: 168 mL. Echogenicity within normal limits. No mass or hydronephrosis visualized. Left Kidney: Renal measurements: 12.3 cm = volume: 135 mL. Echogenicity within normal limits. No mass or hydronephrosis visualized. Bladder: Appears normal for degree of bladder distention. Other: None. IMPRESSION: 1. No sonographic abnormality of the kidneys. 2. No hydronephrosis. Electronically Signed   By: Elester Grim M.D.   On: 05/20/2023 11:13   MM 3D DIAGNOSTIC MAMMOGRAM BILATERAL BREAST Result Date: 05/14/2023 CLINICAL DATA:  33 year old with a spontaneous LEFT nipple discharge which is green in color for which the patient is on her third course of antibiotic therapy. The piercing has been removed for approximately 5 years. She states that she has had longstanding nipple discharge from both breasts, but the current LEFT nipple discharge is purulent and was cultured, demonstrating Staph. The patient is currently approximately 3-1/2 months pregnant. Family history of breast cancer in 2 paternal aunts and in her paternal grandmother. This is the patient's initial baseline mammogram. EXAM: DIGITAL DIAGNOSTIC BILATERAL MAMMOGRAM WITH TOMOSYNTHESIS AND CAD; ULTRASOUND LEFT BREAST LIMITED TECHNIQUE: Bilateral digital diagnostic mammography and breast tomosynthesis was  performed. The images were evaluated with computer-aided detection. ; Targeted ultrasound examination of the left breast was performed. COMPARISON:  None.  ACR Breast Density Category b: There are scattered areas of fibroglandular density. FINDINGS: Full field CC and MLO views of both breasts and a spot compression MLO view of the retroareolar LEFT breast were obtained. RIGHT: No findings suspicious for malignancy. LEFT: No findings suspicious for malignancy. No mammographic abnormality in the retroareolar location. Targeted ultrasound is performed in the retroareolar location, demonstrating no evidence of duct ectasia. No cyst, solid mass or abnormal acoustic shadowing is identified. There is no abnormal fluid collection to suggest abscess. On correlative physical examination, there is a spontaneous discharge emanating from multiple duct orifices of the LEFT nipple which is thick and white in color currently. IMPRESSION: 1. No mammographic or sonographic evidence of malignancy involving the LEFT breast. 2. No mammographic evidence of malignancy involving the RIGHT breast. 3. No evidence of abscess in the retroareolar LEFT breast. RECOMMENDATION: 1. Treatment plan for the infected LEFT nipple discharge. 2. Screening mammogram at age 22 unless there are persistent or subsequent clinical concerns. (Code:SM-B-40A) I have discussed the findings and recommendations with the patient. If applicable, a reminder letter will be sent to the patient regarding the next appointment. BI-RADS CATEGORY  2: Benign. Electronically Signed   By: Rinda Cheers M.D.   On: 05/14/2023 10:47   US  LIMITED ULTRASOUND INCLUDING AXILLA LEFT BREAST  Result Date: 05/14/2023 CLINICAL DATA:  33 year old with a spontaneous LEFT nipple discharge which is green in color for which the patient is on her third course of antibiotic therapy. The piercing has been removed for approximately 5 years. She states that she has had longstanding nipple discharge from both breasts, but the current LEFT nipple discharge is purulent and was cultured, demonstrating Staph. The patient is currently approximately 3-1/2  months pregnant. Family history of breast cancer in 2 paternal aunts and in her paternal grandmother. This is the patient's initial baseline mammogram. EXAM: DIGITAL DIAGNOSTIC BILATERAL MAMMOGRAM WITH TOMOSYNTHESIS AND CAD; ULTRASOUND LEFT BREAST LIMITED TECHNIQUE: Bilateral digital diagnostic mammography and breast tomosynthesis was performed. The images were evaluated with computer-aided detection. ; Targeted ultrasound examination of the left breast was performed. COMPARISON:  None. ACR Breast Density Category b: There are scattered areas of fibroglandular density. FINDINGS: Full field CC and MLO views of both breasts and a spot compression MLO view of the retroareolar LEFT breast were obtained. RIGHT: No findings suspicious for malignancy. LEFT: No findings suspicious for malignancy. No mammographic abnormality in the retroareolar location. Targeted ultrasound is performed in the retroareolar location, demonstrating no evidence of duct ectasia. No cyst, solid mass or abnormal acoustic shadowing is identified. There is no abnormal fluid collection to suggest abscess. On correlative physical examination, there is a spontaneous discharge emanating from multiple duct orifices of the LEFT nipple which is thick and white in color currently. IMPRESSION: 1. No mammographic or sonographic evidence of malignancy involving the LEFT breast. 2. No mammographic evidence of malignancy involving the RIGHT breast. 3. No evidence of abscess in the retroareolar LEFT breast. RECOMMENDATION: 1. Treatment plan for the infected LEFT nipple discharge. 2. Screening mammogram at age 79 unless there are persistent or subsequent clinical concerns. (Code:SM-B-40A) I have discussed the findings and recommendations with the patient. If applicable, a reminder letter will be sent to the patient regarding the next appointment. BI-RADS CATEGORY  2: Benign. Electronically Signed   By: Rinda Cheers M.D.   On: 05/14/2023 10:47  Microbiology: Results for orders placed or performed during the hospital encounter of 06/01/23  Resp panel by RT-PCR (RSV, Flu A&B, Covid) Anterior Nasal Swab     Status: None   Collection Time: 06/01/23 12:55 PM   Specimen: Anterior Nasal Swab  Result Value Ref Range Status   SARS Coronavirus 2 by RT PCR NEGATIVE NEGATIVE Final    Comment: (NOTE) SARS-CoV-2 target nucleic acids are NOT DETECTED.  The SARS-CoV-2 RNA is generally detectable in upper respiratory specimens during the acute phase of infection. The lowest concentration of SARS-CoV-2 viral copies this assay can detect is 138 copies/mL. A negative result does not preclude SARS-Cov-2 infection and should not be used as the sole basis for treatment or other patient management decisions. A negative result may occur with  improper specimen collection/handling, submission of specimen other than nasopharyngeal swab, presence of viral mutation(s) within the areas targeted by this assay, and inadequate number of viral copies(<138 copies/mL). A negative result must be combined with clinical observations, patient history, and epidemiological information. The expected result is Negative.  Fact Sheet for Patients:  BloggerCourse.com  Fact Sheet for Healthcare Providers:  SeriousBroker.it  This test is no t yet approved or cleared by the [place]  FDA and  has been authorized for detection and/or diagnosis of SARS-CoV-2 by FDA under an Emergency Use Authorization (EUA). This EUA will remain  in effect (meaning this test can be used) for the duration of the COVID-19 declaration under Section 564(b)(1) of the Act, 21 U.S.C.section 360bbb-3(b)(1), unless the authorization is terminated  or revoked sooner.       Influenza A by PCR NEGATIVE NEGATIVE Final   Influenza B by PCR NEGATIVE NEGATIVE Final    Comment: (NOTE) The Xpert Xpress SARS-CoV-2/FLU/RSV plus assay is  intended as an aid in the diagnosis of influenza from Nasopharyngeal swab specimens and should not be used as a sole basis for treatment. Nasal washings and aspirates are unacceptable for Xpert Xpress SARS-CoV-2/FLU/RSV testing.  Fact Sheet for Patients: BloggerCourse.com  Fact Sheet for Healthcare Providers: SeriousBroker.it  This test is not yet approved or cleared by the [place]  FDA and has been authorized for detection and/or diagnosis of SARS-CoV-2 by FDA under an Emergency Use Authorization (EUA). This EUA will remain in effect (meaning this test can be used) for the duration of the COVID-19 declaration under Section 564(b)(1) of the Act, 21 U.S.C. section 360bbb-3(b)(1), unless the authorization is terminated or revoked.     Resp Syncytial Virus by PCR NEGATIVE NEGATIVE Final    Comment: (NOTE) Fact Sheet for Patients: BloggerCourse.com  Fact Sheet for Healthcare Providers: SeriousBroker.it  This test is not yet approved or cleared by the [place]  FDA and has been authorized for detection and/or diagnosis of SARS-CoV-2 by FDA under an Emergency Use Authorization (EUA). This EUA will remain in effect (meaning this test can be used) for the duration of the COVID-19 declaration under Section 564(b)(1) of the Act, 21 U.S.C. section 360bbb-3(b)(1), unless the authorization is terminated or revoked.  Performed at [facility], [address] [place], [place] [address]     Labs: CBC: Recent Labs  Lab 06/01/23 1203 06/02/23 0441  WBC 8.4 7.7  NEUTROABS 6.1  --   HGB 12.3 11.8*  HCT 36.7 35.2*  MCV 78.9* 80.0  PLT 253 240   Basic Metabolic Panel: Recent Labs  Lab 06/01/23 1203 06/02/23 0441  NA 136 137  K 3.4* 3.6  CL 105 105  CO2 20* 20*  GLUCOSE 104* 93  BUN 10 9  CREATININE 0.60 0.56  CALCIUM 9.3 8.9   Liver Function  Tests: Recent Labs  Lab 06/01/23 1203  AST 21  ALT 24  ALKPHOS 53  BILITOT 0.5  PROT 7.0  ALBUMIN 3.4*   CBG: Recent Labs  Lab 06/01/23 1505 06/02/23 0419  GLUCAP 95 103*    Discharge time spent: {LESS THAN/GREATER THAN:26388} 30 minutes.  Signed: Brycin Kille, DO [facility] 06/02/2023

## 2023-06-02 NOTE — Progress Notes (Signed)
 PROGRESS NOTE    Taylor Buck  [name]:[id number] DOB: 1990-12-11 DOA: 06/01/2023 PCP: [name], PA-C  Chief Complaint  Patient presents with   Loss of Vision    Hospital Course:  Taylor Buck is a 33 year old female with history of migraine headaches, anxiety, depression, OSA, PCOS, high risk pregnancy (G5 P1122), currently [redacted]w[redacted]d who presented to the ED with 2 episodes of sudden onset vision loss.  The first episode occurred while driving when patient experienced a transient blackout.  Symptoms lasted over 1 minute and had complete resolution.  Second episode occurred in the morning with an associated dizziness.  Patient also complained of left upper extremity swelling after receiving IV fluids 2 weeks ago.  Ultrasound in the ED confirmed thrombus in left cephalic and basilic veins.  MRI was performed which was negative for acute CVA.  Patient was admitted for further workup.  Neurology, hematology and OB/GYN were consulted.  Subjective: Reports she is feeling much better.  Mild residual headache today.  No vision loss.   Objective: Vitals:   06/01/23 2047 06/02/23 0009 06/02/23 0419 06/02/23 0429  BP:  116/68 112/60   Pulse:  72 69   Resp:      Temp:  98.6 F (37 C) 97.9 F (36.6 C)   TempSrc:  Oral Oral   SpO2:  99% 98%   Weight: 117.4 kg   118 kg  Height: 5\' 8"  (1.727 m)       Intake/Output Summary (Last 24 hours) at 06/02/2023 0749 Last data filed at 06/01/2023 1711 Gross per 24 hour  Intake 100 ml  Output --  Net 100 ml   Filed Weights   06/01/23 1157 06/01/23 2047 06/02/23 0429  Weight: 117.9 kg 117.4 kg 118 kg    Examination: General exam: Appears calm and comfortable, NAD  Respiratory system: No work of breathing, symmetric chest wall expansion Cardiovascular system: S1 & S2 heard, RRR.  Gastrointestinal system: Abdomen is nondistended, soft and nontender.  Neuro: Alert and oriented. No focal neurological deficits. Extremities:  Symmetric, expected ROM, minimal induration over left tricep.  No significant erythema or swelling Skin: No rashes, lesions Psychiatry: Demonstrates appropriate judgement and insight. Mood & affect appropriate for situation.   Assessment & Plan:  Principal Problem:   TIA (transient ischemic attack)    Transient vision loss - Etiology unclear may be secondary to complex migraine - MRI neg for CVA and any LVO - Cardiogram ordered, still pending at this time - Neurology has been consulted and agrees with plan for anticoagulation  Left upper extremity venous thrombus in cephalic and basilic veins - Given extensiveness of thrombus we will treat as DVT - Have discussed with hematology and OB/GYN.  Both recommend Lovenox for the duration of pregnancy and 6 weeks postpartum.  This has been called in directly to the pharmacy - Pharmacology has been consulted for bedside teaching. - Have ordered antiphospholipid antibodies, further workup to be done with hematology in the clinic.  Referral placed at discharge  Second trimester high risk pregnancy - Prior preterm deliveries, 2 prior spontaneous abortions - Follows with high risk OB/GYN closely -- Has been taking aspirin , discontinue in light of Lovenox - Have ordered antiphospholipid antibodies as above - Will continue with daily fetal heart tones, currently stable - As planned for anatomy scan 5/29 outpatient  Posttraumatic stress disorder Bipolar disorder Anxiety Depression - Has had hospitalizations for manic episodes in the past.  Currently does not follow with psychiatry - Was previously  referred to [facility] behavioral health.  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  Will discharge when these things have been completed.  Consultants:    Procedures:    Antimicrobials:  Anti-infectives (From admission, onward)    Start     Dose/Rate Route Frequency Ordered Stop   06/01/23 1715  cefUROXime (CEFTIN) tablet 500 mg        500 mg Oral 2 times daily with meals 06/01/23 1702     06/01/23 1545  cefTRIAXone  (ROCEPHIN ) 2 g in sodium chloride  0.9 % 100 mL IVPB        2 g 200 mL/hr over 30 Minutes Intravenous  Once 06/01/23 1534 06/01/23 1711       Data Reviewed: I have personally reviewed following labs and imaging studies CBC: Recent Labs  Lab 06/01/23 1203 06/02/23 0441  WBC 8.4 7.7  NEUTROABS 6.1  --   HGB 12.3 11.8*  HCT 36.7 35.2*  MCV 78.9* 80.0  PLT 253 240   Basic Metabolic Panel: Recent Labs  Lab 06/01/23 1203 06/02/23 0441  NA 136 137  K 3.4* 3.6  CL 105 105  CO2 20* 20*  GLUCOSE 104* 93  BUN 10 9  CREATININE 0.60 0.56  CALCIUM 9.3 8.9   GFR: Estimated Creatinine Clearance: 135 mL/min (by C-G formula based on SCr of 0.56 mg/dL). Liver Function Tests: Recent Labs  Lab 06/01/23 1203  AST 21  ALT 24  ALKPHOS 53  BILITOT 0.5  PROT 7.0  ALBUMIN 3.4*   CBG: Recent Labs  Lab 06/01/23 1505 06/02/23 0419  GLUCAP 95 103*    Recent Results (from the past 240 hours)  Resp panel by RT-PCR (RSV, Flu A&B, Covid) Anterior Nasal Swab     Status: None   Collection Time: 06/01/23 12:55 PM   Specimen: Anterior Nasal Swab  Result Value Ref Range Status   SARS Coronavirus 2 by RT PCR NEGATIVE NEGATIVE Final    Comment: (NOTE) SARS-CoV-2 target nucleic acids are NOT DETECTED.  The SARS-CoV-2 RNA is generally detectable in upper  respiratory specimens during the acute phase of infection. The lowest concentration of SARS-CoV-2 viral copies this assay can detect is 138 copies/mL. A negative result does not preclude SARS-Cov-2 infection and should not be used as the sole basis for treatment or other patient management decisions. A negative result may occur with  improper specimen collection/handling, submission of specimen other than nasopharyngeal swab, presence of viral mutation(s) within the areas targeted by this assay, and inadequate number of viral copies(<138 copies/mL). A negative result must be combined with clinical observations, patient history, and epidemiological information. The expected result is Negative.  Fact Sheet for Patients:  BloggerCourse.com  Fact Sheet for Healthcare Providers:  SeriousBroker.it  This test is no t yet approved or cleared by the [place]  FDA and  has been authorized for detection and/or diagnosis of SARS-CoV-2 by FDA under an Emergency Use Authorization (EUA). This EUA will remain  in effect (meaning this test can be used) for the duration of the COVID-19 declaration under Section 564(b)(1) of the Act, 21 U.S.C.section 360bbb-3(b)(1), unless the authorization is terminated  or revoked sooner.       Influenza A by PCR NEGATIVE NEGATIVE Final   Influenza B by PCR NEGATIVE NEGATIVE Final    Comment: (NOTE) The Xpert Xpress SARS-CoV-2/FLU/RSV plus assay is intended as an aid in the diagnosis of influenza from Nasopharyngeal swab specimens and should not be used as a sole basis for treatment. Nasal washings and aspirates are unacceptable for Xpert Xpress SARS-CoV-2/FLU/RSV testing.  Fact Sheet for Patients: BloggerCourse.com  Fact Sheet for Healthcare Providers: SeriousBroker.it  This test is not yet approved or cleared by the [place]  FDA and has been  authorized for detection and/or diagnosis of SARS-CoV-2 by FDA under an Emergency Use Authorization (EUA). This EUA will remain in effect (meaning this test can be used) for the duration of the COVID-19 declaration under Section 564(b)(1) of the Act, 21 U.S.C. section 360bbb-3(b)(1), unless the authorization is terminated or revoked.     Resp Syncytial Virus by PCR NEGATIVE NEGATIVE Final    Comment: (NOTE) Fact Sheet for Patients: BloggerCourse.com  Fact Sheet for Healthcare Providers: SeriousBroker.it  This test is not yet approved or cleared by the [place]  FDA and has been authorized for detection and/or diagnosis of SARS-CoV-2 by FDA under an Emergency Use Authorization (EUA). This EUA will remain in effect (meaning this test can be used) for the duration of the COVID-19 declaration under Section 564(b)(1) of the Act, 21 U.S.C. section 360bbb-3(b)(1), unless the authorization is terminated or revoked.  Performed at [facility], [address], [place], [place] [address]      Radiology Studies: MR ANGIO HEAD WO CONTRAST Result Date: 06/01/2023 CLINICAL DATA:  Episodes with temporary loss of vision loss lasting 30-45 seconds. Vision has since normalized. EXAM: MRA HEAD WITHOUT CONTRAST TECHNIQUE: Angiographic images of the Circle of Willis were acquired using MRA technique without intravenous contrast. COMPARISON:  None Available. FINDINGS: Anterior circulation: The internal carotid arteries are within normal limits from high cervical segments through the ICA termini. The A1 and M1 segments are normal. The anterior communicating artery is patent. The MCA bifurcations are normal bilaterally. The ACA and MCA branch vessels are normal bilaterally. No aneurysm is present. Posterior circulation: The vertebral arteries are codominant. The right PICA origin is just below the field of view. Left AICA is dominant and normal. The  vertebrobasilar junction basilar artery normal. The superior cerebellar arteries are patent bilaterally. Both posterior cerebral arteries originate from the basilar tip. The PCA branch vessels are normal bilaterally. No aneurysm is present. Anatomic variants: None IMPRESSION: Normal MRA Circle of Willis without significant proximal stenosis, aneurysm, or branch vessel occlusion. Electronically Signed   By: Audree Leas M.D.   On: 06/01/2023 15:01   MR BRAIN WO CONTRAST Result Date: 06/01/2023 CLINICAL DATA:  Headache. Transient episodes of vision loss. Vision has since normalized. EXAM: MRI HEAD WITHOUT CONTRAST TECHNIQUE: Multiplanar, multiecho pulse sequences of the brain and surrounding structures were obtained without intravenous contrast. COMPARISON:  None Available. FINDINGS: Brain: No acute infarct, hemorrhage, or mass lesion is present. No significant white matter lesions are present. Deep brain nuclei are within normal limits. The ventricles are of normal size. No significant extraaxial fluid collection is present. The brainstem and cerebellum are within normal limits. The internal auditory canals are within normal limits. Midline structures are within normal limits. Vascular:  Flow is present in the major intracranial arteries. Skull and upper cervical spine: The craniocervical junction is normal. Upper cervical spine is within normal limits. Marrow signal is unremarkable. Sinuses/Orbits: The paranasal sinuses and mastoid air cells are clear. The globes and orbits are within normal limits. IMPRESSION: Normal MRI appearance of the brain. No acute or focal lesion to explain the patient's symptoms. Electronically Signed   By: Audree Leas M.D.   On: 06/01/2023 14:58   US  Venous Img Upper Uni Left Result Date: 06/01/2023 CLINICAL DATA:  Left upper extremity pain and swelling following IV placement. EXAM: Left UPPER EXTREMITY VENOUS DOPPLER ULTRASOUND TECHNIQUE: Gray-scale sonography with  graded compression, as well as color Doppler and duplex ultrasound were performed to evaluate the upper extremity deep venous system from the level of the subclavian vein and including the jugular, axillary, basilic, radial, ulnar and upper cephalic vein. Spectral Doppler was utilized to evaluate flow at rest and with distal augmentation maneuvers. COMPARISON:  None Available. FINDINGS: Internal Jugular Vein: No evidence of thrombus. Normal compressibility, respiratory phasicity and response to augmentation. Subclavian Vein: No evidence of thrombus. Normal compressibility, respiratory phasicity and response to augmentation. Axillary Vein: No evidence of thrombus. Normal compressibility, respiratory phasicity and response to augmentation. Cephalic Vein: The cephalic vein does not compress or demonstrate augmentation. Thrombus is present. Basilic Vein: The basilic vein does not compress or demonstrate augmentation. Thrombus present. Brachial Veins: No evidence of thrombus. Normal compressibility, respiratory phasicity and response to augmentation. Radial Veins: No evidence of thrombus. Normal compressibility, respiratory phasicity and response to augmentation. Ulnar Veins: No evidence of thrombus. Normal compressibility, respiratory phasicity and response to augmentation. Venous Reflux:  None visualized. Other Findings:  None visualized. IMPRESSION: 1. Thrombus in the left cephalic and basilic veins. Critical Value/emergent results were called by telephone at the time of interpretation on 06/01/2023 at 2:33 pm to provider [facility] , who verbally acknowledged these results. Electronically Signed   By: Audree Leas M.D.   On: 06/01/2023 14:36    Scheduled Meds:  ARIPiprazole   10 mg Oral QHS   cefUROXime  500 mg Oral BID WC   enoxaparin (LOVENOX) injection  120 mg Subcutaneous Q12H   multivitamin with minerals  1 tablet Oral Daily   sodium chloride  flush  3 mL Intravenous Q12H   Continuous  Infusions:   LOS: 0 days  MDM: Patient is high risk for one or more organ failure.  They necessitate ongoing hospitalization for continued IV therapies and subsequent lab monitoring. Total time spent interpreting labs and vitals, reviewing the medical record, coordinating care amongst consultants and care team members, directly assessing and discussing care with the patient and/or family: 55 min  Kelleen Stolze, DO [facility]  To contact the attending physician between 7A-7P please use Epic Chat. To contact the covering physician during after hours 7P-7A, please review Amion.  06/02/2023, 7:49 AM   *This document has been created with the assistance of dictation software. Please excuse typographical errors. *

## 2023-06-02 NOTE — Plan of Care (Signed)

## 2023-06-02 NOTE — Consult Note (Signed)
 PHARMACY - ANTICOAGULATION CONSULT NOTE  Pharmacy Consult for Lovenox Indication: DVT -  Thrombus in the left cephalic and basilic veins  Allergies  Allergen Reactions   Milk-Related Compounds Anaphylaxis   Latex Hives   Penicillins Hives    Has patient had a PCN reaction causing immediate rash, facial/tongue/throat swelling, SOB or lightheadedness with hypotension: Yes Has patient had a PCN reaction causing severe rash involving mucus membranes or skin necrosis: No Has patient had a PCN reaction that required hospitalization: No Has patient had a PCN reaction occurring within the last 10 years: No If all of the above answers are "NO", then may proceed with Cephalosporin use.    Phenergan  [Promethazine  Hcl]     Panic attack   Reglan  [Metoclopramide ] Other (See Comments)    Panic attack    Patient Measurements: Height: 5\' 8"  (172.7 cm) Weight: 118 kg (260 lb 2.3 oz) IBW/kg (Calculated) : 63.9 HEPARIN DW (KG): 91.1  Vital Signs: Temp: 97.9 F (36.6 C) (05/05 0419) Temp Source: Oral (05/05 0419) BP: 112/60 (05/05 0419) Pulse Rate: 69 (05/05 0419)  Labs: Recent Labs    06/01/23 1203 06/02/23 0441  HGB 12.3 11.8*  HCT 36.7 35.2*  PLT 253 240  APTT 27  --   LABPROT 13.9 14.5  INR 1.1 1.1  CREATININE 0.60 0.56  TROPONINIHS 3  --     Estimated Creatinine Clearance: 135 mL/min (by C-G formula based on SCr of 0.56 mg/dL).   Medical History: Past Medical History:  Diagnosis Date   Anxiety    Arthritis    Asthma    Depression    Heart murmur    Infection    chlamydia age 82   PCOS (polycystic ovarian syndrome)    Sleep apnea    Urinary tract infection     Medications:  Medications Prior to Admission  Medication Sig Dispense Refill Last Dose/Taking   ARIPiprazole  (ABILIFY ) 10 MG tablet Take 1 tablet (10 mg total) by mouth daily. 30 tablet 11 05/31/2023   acetaminophen  (TYLENOL ) 325 MG tablet Take 650 mg by mouth daily as needed for pain.      aspirin  EC 81 MG  tablet Take 1 tablet (81 mg total) by mouth daily. Swallow whole. 30 tablet 12    Blood Pressure Monitoring (BLOOD PRESSURE KIT) DEVI 1 Device by Does not apply route once a week. 1 each 0    calcium carbonate (TUMS EX) 750 MG chewable tablet Chew 1 tablet by mouth at bedtime as needed (for nausea).      cyclobenzaprine  (FLEXERIL ) 10 MG tablet Take 1 tablet (10 mg total) by mouth 3 (three) times daily as needed for muscle spasms. (Patient not taking: Reported on 05/29/2023) 30 tablet 0    mometasone-formoterol (DULERA) 100-5 MCG/ACT AERO Inhale 2 puffs into the lungs daily.      Multiple Vitamins-Minerals (MULTIVITAMIN WITH MINERALS) tablet Take 1 tablet by mouth daily.      nicotine  (NICODERM CQ  - DOSED IN MG/24 HOURS) 21 mg/24hr patch RX #1 Weeks 1-4: 21 mg x 1 patch daily. Wear for 24 hours. If you have sleep disturbances, remove at bedtime. 28 patch 1    Prenatal Vit-Fe Phos-FA-Omega (VITAFOL  GUMMIES) 3.33-0.333-34.8 MG CHEW CHEW 1 TABLET BY MOUTH EVERY DAY 90 tablet 5    progesterone  (PROMETRIUM ) 200 MG capsule Place one capsule vaginally at bedtime 30 capsule 3    sertraline  (ZOLOFT ) 25 MG tablet Take 1 tablet (25 mg total) by mouth daily. (Patient not taking: Reported on  06/01/2023) 30 tablet 2 Not Taking    Assessment: 33YO female currently [redacted] weeks pregnant presnets to ED with concerns for vision loss. PMH includes preterm delivery; Bipolar 1 disorder ; History of depression; hx of substance abuse; Trichomonas vaginitis; Asthma,and PTSD. Imagine shows " Thrombus in the left cephalic and basilic veins:"   Pharmacy consulted to dose enoxaparin for LUE clot in pregnancy.   Goal of Therapy:  Anti-Xa level 0.5-1 units/ml 4hrs after LMWH dose given (peak) Monitor platelets by anticoagulation protocol: Yes   Plan:  Lovenox 1mg /kg (117mg ; round to 120mg ) [place] q 12 hours Anti-Xa level ordered for 4 hours after 3rd dose on 5/6 @ 0900, nursing communication ordered- if 0500 dose is delayed by more  than 30 minutes, please let pharmacy know so level can be re-timed CBC every 72 hours Pharmacy to complete teaching prior to discharge  Genoveva Kidney, PharmD, BCPS 06/02/2023 7:34 AM

## 2023-06-02 NOTE — Progress Notes (Signed)
 FHT of 147 at 1740.

## 2023-06-03 ENCOUNTER — Observation Stay (HOSPITAL_BASED_OUTPATIENT_CLINIC_OR_DEPARTMENT_OTHER)
Admit: 2023-06-03 | Discharge: 2023-06-03 | Disposition: A | Discharge status: Home / Self Care | Attending: Internal Medicine | Admitting: Internal Medicine

## 2023-06-03 DIAGNOSIS — G459 Transient cerebral ischemic attack, unspecified: Secondary | ICD-10-CM | POA: Diagnosis not present

## 2023-06-03 DIAGNOSIS — R011 Cardiac murmur, unspecified: Secondary | ICD-10-CM

## 2023-06-03 LAB — ECHOCARDIOGRAM COMPLETE
AR max vel: 3.08 cm2
AV Area VTI: 3.24 cm2
AV Area mean vel: 2.67 cm2
AV Mean grad: 5 mmHg
AV Peak grad: 7.2 mmHg
Ao pk vel: 1.34 m/s
Area-P 1/2: 3.39 cm2
Height: 68 in
MV VTI: 3.25 cm2
S' Lateral: 3.1 cm
Weight: 4324.54 [oz_av]

## 2023-06-03 LAB — HEPARIN ANTI-XA: Heparin LMW: 0.77 [IU]/mL

## 2023-06-03 NOTE — Consult Note (Signed)
 PHARMACY - ANTICOAGULATION CONSULT NOTE  Pharmacy Consult for Lovenox Indication: DVT -  Thrombus in the left cephalic and basilic veins  Allergies  Allergen Reactions   Milk (Cow) Anaphylaxis   Milk-Related Compounds Anaphylaxis   Lorazepam Other (See Comments)    PANIC   Latex Hives   Penicillins Hives    Has patient had a PCN reaction causing immediate rash, facial/tongue/throat swelling, SOB or lightheadedness with hypotension: Yes Has patient had a PCN reaction causing severe rash involving mucus membranes or skin necrosis: No Has patient had a PCN reaction that required hospitalization: No Has patient had a PCN reaction occurring within the last 10 years: No If all of the above answers are "NO", then may proceed with Cephalosporin use.    Phenergan  [Promethazine  Hcl]     Panic attack   Reglan  [Metoclopramide ] Other (See Comments)    Panic attack    Patient Measurements: Height: 5\' 8"  (172.7 cm) Weight: 122.6 kg (270 lb 4.5 oz) IBW/kg (Calculated) : 63.9 HEPARIN DW (KG): 91.1  Vital Signs: Temp: 98.5 F (36.9 C) (05/06 0842) Temp Source: Oral (05/06 0450) BP: 119/58 (05/06 0842) Pulse Rate: 76 (05/06 0842)  Labs: Recent Labs    06/01/23 1203 06/02/23 0441 06/03/23 0959  HGB 12.3 11.8*  --   HCT 36.7 35.2*  --   PLT 253 240  --   APTT 27  --   --   LABPROT 13.9 14.5  --   INR 1.1 1.1  --   HEPRLOWMOCWT  --   --  0.77  CREATININE 0.60 0.56  --   TROPONINIHS 3  --   --     Estimated Creatinine Clearance: 138 mL/min (by C-G formula based on SCr of 0.56 mg/dL).   Medical History: Past Medical History:  Diagnosis Date   Anxiety    Arthritis    Asthma    Depression    Heart murmur    Infection    chlamydia age 5   PCOS (polycystic ovarian syndrome)    Sleep apnea    Urinary tract infection     Medications:  Medications Prior to Admission  Medication Sig Dispense Refill Last Dose/Taking   acetaminophen  (TYLENOL ) 325 MG tablet Take 650 mg by  mouth daily as needed for pain.   Taking As Needed   albuterol (VENTOLIN HFA) 108 (90 Base) MCG/ACT inhaler Inhale 2 puffs into the lungs every 6 (six) hours as needed for wheezing or shortness of breath.   05/31/2023   ARIPiprazole  (ABILIFY ) 10 MG tablet Take 1 tablet (10 mg total) by mouth daily. 30 tablet 11 05/31/2023   aspirin  EC 81 MG tablet Take 1 tablet (81 mg total) by mouth daily. Swallow whole. 30 tablet 12 05/31/2023   calcium carbonate (TUMS EX) 750 MG chewable tablet Chew 1 tablet by mouth at bedtime as needed (for nausea).   05/31/2023   mometasone-formoterol (DULERA) 100-5 MCG/ACT AERO Inhale 2 puffs into the lungs daily.   05/31/2023   Multiple Vitamins-Minerals (MULTIVITAMIN WITH MINERALS) tablet Take 1 tablet by mouth daily.   05/31/2023   Prenatal Vit-Fe Phos-FA-Omega (VITAFOL  GUMMIES) 3.33-0.333-34.8 MG CHEW CHEW 1 TABLET BY MOUTH EVERY DAY 90 tablet 5 05/31/2023   progesterone  (PROMETRIUM ) 200 MG capsule Place one capsule vaginally at bedtime 30 capsule 3 05/31/2023   Blood Pressure Monitoring (BLOOD PRESSURE KIT) DEVI 1 Device by Does not apply route once a week. 1 each 0    cyclobenzaprine  (FLEXERIL ) 10 MG tablet Take 1 tablet (10  mg total) by mouth 3 (three) times daily as needed for muscle spasms. (Patient not taking: Reported on 05/29/2023) 30 tablet 0 Not Taking   nicotine  (NICODERM CQ  - DOSED IN MG/24 HOURS) 21 mg/24hr patch RX #1 Weeks 1-4: 21 mg x 1 patch daily. Wear for 24 hours. If you have sleep disturbances, remove at bedtime. (Patient not taking: Reported on 06/02/2023) 28 patch 1 Not Taking   sertraline  (ZOLOFT ) 25 MG tablet Take 1 tablet (25 mg total) by mouth daily. (Patient not taking: Reported on 06/01/2023) 30 tablet 2 Not Taking    Assessment: 33YO female currently [redacted] weeks pregnant presnets to ED with concerns for vision loss. PMH includes preterm delivery; Bipolar 1 disorder ; History of depression; hx of substance abuse; Trichomonas vaginitis; Asthma,and PTSD. Imagine shows "  Thrombus in the left cephalic and basilic veins:"   Pharmacy consulted to dose enoxaparin for LUE clot in pregnancy.   Goal of Therapy:  Anti-Xa level 0.5-1 units/ml 4hrs after LMWH dose given (peak) Monitor platelets by anticoagulation protocol: Yes   Plan:  Anti-Xa level 0.77 therapeutic  Continue Lovenox 1mg /kg (117mg ; round to 120mg ) [place] q 12 hours CBC every 72 hours while inpatient  Lovenox teaching completed 5/5  Genoveva Kidney, PharmD, BCPS 06/03/2023 10:52 AM

## 2023-06-03 NOTE — Progress Notes (Signed)
 Pt refuses wheelchair at discharge, ambulating without difficulty with sig other

## 2023-06-03 NOTE — Progress Notes (Signed)
*  PRELIMINARY RESULTS* Echocardiogram 2D Echocardiogram has been performed.  Broadus Canes 06/03/2023, 8:39 AM

## 2023-06-04 ENCOUNTER — Telehealth: Payer: Self-pay

## 2023-06-04 LAB — ANTIPHOSPHOLIPID SYNDROME EVAL, BLD
Anticardiolipin IgA: <9 U/mL (ref 0–11)
Anticardiolipin IgG: <9 GPL U/mL (ref 0–14)
Anticardiolipin IgM: 10 [MPL'U]/mL (ref 0–12)
DRVVT: 28.9 s (ref 0.0–47.0)
PTT Lupus Anticoagulant: 39.4 s (ref 0.0–43.5)
Phosphatydalserine, IgA: <1 {APS'U} (ref 0–19)
Phosphatydalserine, IgG: 9 U (ref 0–30)
Phosphatydalserine, IgM: 18 U (ref 0–30)

## 2023-06-04 NOTE — Telephone Encounter (Signed)
 TC from pt about not feeling baby move as much. Also wanted address to behavioral health UC. My chart message sent with information about inconsistent fetal movements this early in pregnancy, warning signs to look out for, and behavioral health urgent care address and name.

## 2023-06-05 ENCOUNTER — Ambulatory Visit (HOSPITAL_COMMUNITY)
Admission: EM | Admit: 2023-06-05 | Discharge: 2023-06-05 | Disposition: A | Discharge status: Home / Self Care | Attending: Psychiatry | Admitting: Psychiatry

## 2023-06-05 DIAGNOSIS — F4310 Post-traumatic stress disorder, unspecified: Secondary | ICD-10-CM | POA: Insufficient documentation

## 2023-06-05 DIAGNOSIS — F3162 Bipolar disorder, current episode mixed, moderate: Secondary | ICD-10-CM

## 2023-06-05 DIAGNOSIS — F419 Anxiety disorder, unspecified: Secondary | ICD-10-CM
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
## 2023-06-05 NOTE — Discharge Instructions (Addendum)
 Thank you for coming in today. We discussed various coping/distress tolerance/lifestyle factors such as optimal sleep, diet, physical activity and establishing therapy with [facility] outpatient clinic. Continue medications as prescribed by PCP and OB. In the event of worsening symptoms, please call the crisis hotline, 911 and or go to the nearest ED for appropriate evaluation and treatment of symptoms. Follow-up with primary care provider for other medical issues, concerns and or health care needs

## 2023-06-05 NOTE — ED Notes (Signed)
Patient discharged home by provider

## 2023-06-12 ENCOUNTER — Other Ambulatory Visit

## 2023-06-12 DIAGNOSIS — Z3A18 18 weeks gestation of pregnancy: Secondary | ICD-10-CM

## 2023-06-13 ENCOUNTER — Encounter: Payer: Self-pay | Admitting: Maternal & Fetal Medicine

## 2023-06-13 LAB — GLUCOSE TOLERANCE, 2 HOURS W/ 1HR
Glucose, 1 hour: 138 mg/dL (ref 70–179)
Glucose, 2 hour: 147 mg/dL (ref 70–152)
Glucose, Fasting: 88 mg/dL (ref 70–91)

## 2023-06-14 LAB — AFP, SERUM, OPEN SPINA BIFIDA
AFP MoM: 0.68
AFP Value: 20.9 ng/mL
Gest. Age on Collection Date: 18 wk
Maternal Age At EDD: 33.6 a
OSBR Risk 1 IN: 10000
Test Results:: NEGATIVE
Weight: 270 [lb_av]

## 2023-06-17 ENCOUNTER — Ambulatory Visit: Payer: Self-pay | Admitting: Obstetrics and Gynecology

## 2023-06-17 DIAGNOSIS — O0990 Supervision of high risk pregnancy, unspecified, unspecified trimester: Secondary | ICD-10-CM

## 2023-06-18 ENCOUNTER — Encounter (HOSPITAL_COMMUNITY): Payer: Self-pay | Admitting: Obstetrics and Gynecology

## 2023-06-18 ENCOUNTER — Inpatient Hospital Stay (HOSPITAL_COMMUNITY)
Admission: AD | Admit: 2023-06-18 | Discharge: 2023-06-18 | Disposition: A | Discharge status: Home / Self Care | Attending: Obstetrics and Gynecology | Admitting: Obstetrics and Gynecology

## 2023-06-18 DIAGNOSIS — K920 Hematemesis: Secondary | ICD-10-CM

## 2023-06-18 DIAGNOSIS — Z3A18 18 weeks gestation of pregnancy: Secondary | ICD-10-CM

## 2023-06-18 DIAGNOSIS — O0990 Supervision of high risk pregnancy, unspecified, unspecified trimester: Secondary | ICD-10-CM

## 2023-06-18 DIAGNOSIS — R109 Unspecified abdominal pain: Secondary | ICD-10-CM | POA: Diagnosis not present

## 2023-06-18 DIAGNOSIS — R112 Nausea with vomiting, unspecified: Secondary | ICD-10-CM

## 2023-06-18 DIAGNOSIS — O2232 Deep phlebothrombosis in pregnancy, second trimester: Secondary | ICD-10-CM | POA: Insufficient documentation

## 2023-06-18 DIAGNOSIS — Z7902 Long term (current) use of antithrombotics/antiplatelets: Secondary | ICD-10-CM | POA: Diagnosis not present

## 2023-06-18 DIAGNOSIS — O99612 Diseases of the digestive system complicating pregnancy, second trimester: Secondary | ICD-10-CM | POA: Diagnosis not present

## 2023-06-18 DIAGNOSIS — K2091 Esophagitis, unspecified with bleeding: Secondary | ICD-10-CM | POA: Diagnosis not present

## 2023-06-18 DIAGNOSIS — O26812 Pregnancy related exhaustion and fatigue, second trimester: Secondary | ICD-10-CM | POA: Diagnosis present

## 2023-06-18 DIAGNOSIS — I82622 Acute embolism and thrombosis of deep veins of left upper extremity: Secondary | ICD-10-CM | POA: Insufficient documentation

## 2023-06-18 DIAGNOSIS — A5901 Trichomonal vulvovaginitis: Secondary | ICD-10-CM

## 2023-06-18 DIAGNOSIS — O26892 Other specified pregnancy related conditions, second trimester: Secondary | ICD-10-CM | POA: Diagnosis not present

## 2023-06-18 DIAGNOSIS — R1110 Vomiting, unspecified: Secondary | ICD-10-CM | POA: Diagnosis present

## 2023-06-18 LAB — URINALYSIS, ROUTINE W REFLEX MICROSCOPIC
Bilirubin Urine: NEGATIVE
Glucose, UA: NEGATIVE mg/dL
Hgb urine dipstick: NEGATIVE
Ketones, ur: NEGATIVE mg/dL
Nitrite: NEGATIVE
Protein, ur: NEGATIVE mg/dL
Specific Gravity, Urine: 1.018 (ref 1.005–1.030)
pH: 6 (ref 5.0–8.0)

## 2023-06-18 MED ORDER — PANTOPRAZOLE SODIUM 20 MG PO TBEC: 20.0000 mg | DELAYED_RELEASE_TABLET | Freq: Every day | ORAL | 1 refills | Status: DC

## 2023-06-18 MED ORDER — SUCRALFATE 1 GM/10ML PO SUSP
1.0000 g | Freq: Once | ORAL | Status: AC
  Administered 2023-06-18: 1 g via ORAL
  Filled 2023-06-18: qty 10

## 2023-06-18 MED ORDER — ONDANSETRON HCL 4 MG/2ML IJ SOLN: 4.0000 mg | Freq: Once | INTRAMUSCULAR | Status: DC

## 2023-06-18 MED ORDER — LACTATED RINGERS IV SOLN
Freq: Once | INTRAVENOUS | Status: DC
Start: 2023-06-18 — End: 2023-06-18

## 2023-06-18 MED ORDER — SUCRALFATE 1 GM/10ML PO SUSP: 1.0000 g | Freq: Three times a day (TID) | ORAL | 0 refills | Status: DC

## 2023-06-18 MED ORDER — ONDANSETRON 4 MG PO TBDP
4.0000 mg | ORAL_TABLET | Freq: Once | ORAL | Status: AC
  Administered 2023-06-18: 4 mg via ORAL
  Filled 2023-06-18: qty 1

## 2023-06-18 MED ORDER — FAMOTIDINE 20 MG PO TABS
20.0000 mg | ORAL_TABLET | Freq: Once | ORAL | Status: AC
  Administered 2023-06-18: 20 mg via ORAL
  Filled 2023-06-18: qty 1

## 2023-06-18 MED ORDER — ONDANSETRON 4 MG PO TBDP: 4.0000 mg | ORAL_TABLET | Freq: Four times a day (QID) | ORAL | 0 refills | Status: DC | PRN

## 2023-06-18 MED ORDER — FAMOTIDINE IN NACL 20-0.9 MG/50ML-% IV SOLN: 20.0000 mg | Freq: Once | INTRAVENOUS | Status: DC

## 2023-06-18 NOTE — Progress Notes (Signed)
 Written and verbal d/c instructions given and understanding voiced.

## 2023-06-18 NOTE — MAU Note (Signed)
 Taylor Buck is a 33 y.o. at [redacted]w[redacted]d here in MAU reporting n/v at 12mn. She noticed blood in the emesis which frightened her due to taking Lovenox  for DVT in L arm. Has had a h/a about and some lower back pain since getting to Triage. States her stomach burns  LMP: n/a Onset of complaint: 12mn Pain score: 4 for h/a and 2 lower back Vitals:   06/18/23 0052 06/18/23 0057  BP:  115/63  Pulse: 84   Resp: 17   Temp: 98.3 F (36.8 C)   SpO2: 99%      FHT: unable to hear in Triage  Lab orders placed from triage: u/a

## 2023-06-18 NOTE — MAU Note (Signed)
 Pt states she is a hard IV stick. Can only use her R arm due to DVT in L. RN did not see any veins she felt comfortable trying for IV start. PT offered (per Leonel Ram CNM)  to try oral meds if she did not want IV and she opted for oral meds and no IV.
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
## 2023-06-25 ENCOUNTER — Inpatient Hospital Stay

## 2023-06-25 ENCOUNTER — Telehealth: Payer: Self-pay

## 2023-06-25 ENCOUNTER — Encounter: Payer: Self-pay | Admitting: Nurse Practitioner

## 2023-06-25 ENCOUNTER — Inpatient Hospital Stay: Attending: Nurse Practitioner | Admitting: Nurse Practitioner

## 2023-06-25 VITALS — BP 108/70 | HR 73 | Temp 97.7°F | Resp 18 | Ht 68.0 in | Wt 261.6 lb

## 2023-06-25 DIAGNOSIS — I82612 Acute embolism and thrombosis of superficial veins of left upper extremity: Secondary | ICD-10-CM

## 2023-06-25 NOTE — Progress Notes (Signed)
 New Hematology/Oncology Consult   Requesting MD: Dr. Alexandra Dezii  [phone]  Reason for Consult: Thrombosis  HPI: Taylor Buck is a 33 year old woman referred after she was found to have a left upper extremity thrombus in the left cephalic and basilic veins on Doppler ultrasound on 06/01/2023.  The decision was made to treat as a DVT due to the extensive superficial thrombus.  At the time of the thrombus she was in the second trimester of a high risk pregnancy.  She was started on Lovenox  to be continued for the duration of the pregnancy and 6 weeks postpartum.  Antiphospholipid syndrome panel completed 06/02/2023 was negative.  She reports a history of 2 episodes of phlebitis in the past, both following lab draws.  Symptoms resolved with warm compresses.  She reports receiving IV fluids around 05/20/2023.  The IV was placed on her left arm.  She subsequently developed a hard vein, associated pain and developed "big knots" on both sides of the lower arm and medial upper arm.  On 05/30/2023 she developed "tunnel vision and then "black vision".  She called EMS with the plan to go to the emergency department.  She went home and fell asleep.  When she woke up she had similar symptoms.  She presented to the emergency department on 06/01/2023.  Left upper extremity Doppler ultrasound showed thrombus in the left cephalic and basilic veins.  Brain MRI, MRA negative.  She was discharged home on twice daily Lovenox  which she has continued.  She is approximately 20 weeks into the pregnancy.  She has been administering Lovenox  twice daily as prescribed.  She notes the injections are painful and she has developed bruises and "knots" across the abdominal wall.  The left arm swelling and discomfort have resolved.  She does not recall any injury to the left arm or significant immobility prior to the thrombus.    She has had 1 miscarriage, 2 live births.  She thinks her mother has had "clots" in the past.   Maternal grandmother had skin and breast cancer.  Maternal aunt had blood cancer.  Maternal aunt with breast cancer.  Paternal aunt with breast cancer.  She had a mammogram 05/14/2023 with no evidence of malignancy involving the left or right breast.     Past Medical History:  Diagnosis Date   Anxiety    Arthritis    Asthma    Depression    Heart murmur    Infection    chlamydia age 26   PCOS (polycystic ovarian syndrome)    Sleep apnea    Urinary tract infection      Past Surgical History:  Procedure Laterality Date   CHOLECYSTECTOMY     HAND SURGERY     reconstruction- injury due to frostbite as child   HERNIA REPAIR     rt inguinal     Current Outpatient Medications:    acetaminophen  (TYLENOL ) 325 MG tablet, Take 650 mg by mouth daily as needed for pain., Disp: , Rfl:    albuterol  (VENTOLIN  HFA) 108 (90 Base) MCG/ACT inhaler, Inhale 2 puffs into the lungs every 6 (six) hours as needed for wheezing or shortness of breath., Disp: , Rfl:    ARIPiprazole  (ABILIFY ) 10 MG tablet, Take 1 tablet (10 mg total) by mouth daily., Disp: 30 tablet, Rfl: 11   Blood Pressure Monitoring (BLOOD PRESSURE KIT) DEVI, 1 Device by Does not apply route once a week., Disp: 1 each, Rfl: 0   calcium  carbonate (TUMS EX) 750 MG chewable  tablet, Chew 1 tablet by mouth at bedtime as needed (for nausea)., Disp: , Rfl:    enoxaparin  (LOVENOX ) 120 MG/0.8ML injection, Inject 0.8 mLs (120 mg total) into the skin every 12 (twelve) hours., Disp: 329.6 mL, Rfl: 0   mometasone-formoterol (DULERA) 100-5 MCG/ACT AERO, Inhale 2 puffs into the lungs daily., Disp: , Rfl:    Multiple Vitamins-Minerals (MULTIVITAMIN WITH MINERALS) tablet, Take 1 tablet by mouth daily., Disp: , Rfl:    ondansetron  (ZOFRAN -ODT) 4 MG disintegrating tablet, Take 1 tablet (4 mg total) by mouth every 6 (six) hours as needed for nausea., Disp: 20 tablet, Rfl: 0   pantoprazole (PROTONIX) 20 MG tablet, Take 1 tablet (20 mg total) by mouth  daily., Disp: 30 tablet, Rfl: 1   Prenatal Vit-Fe Phos-FA-Omega (VITAFOL  GUMMIES) 3.33-0.333-34.8 MG CHEW, CHEW 1 TABLET BY MOUTH EVERY DAY, Disp: 90 tablet, Rfl: 5   progesterone  (PROMETRIUM ) 200 MG capsule, Place one capsule vaginally at bedtime, Disp: 30 capsule, Rfl: 3   sucralfate (CARAFATE) 1 GM/10ML suspension, Take 10 mLs (1 g total) by mouth 4 (four) times daily -  with meals and at bedtime., Disp: 420 mL, Rfl: 0:     Allergies  Allergen Reactions   Milk (Cow) Anaphylaxis   Milk-Related Compounds Anaphylaxis   Lorazepam Other (See Comments)    PANIC   Latex Hives   Penicillins Hives    Has patient had a PCN reaction causing immediate rash, facial/tongue/throat swelling, SOB or lightheadedness with hypotension: Yes Has patient had a PCN reaction causing severe rash involving mucus membranes or skin necrosis: No Has patient had a PCN reaction that required hospitalization: No Has patient had a PCN reaction occurring within the last 10 years: No If all of the above answers are "NO", then may proceed with Cephalosporin use.    Phenergan  [Promethazine  Hcl]     Panic attack   Reglan  [Metoclopramide ] Other (See Comments)    Panic attack    FH: Mother with history of "clots".  Maternal grandmother skin and breast cancer.  Maternal aunt blood cancer.  Maternal aunt breast cancer.  Paternal aunt breast cancer.  SOCIAL HISTORY: She lives in [place].  She has 2 children ages 46 and 64.  1 miscarriage.  Currently [redacted] weeks pregnant.  She works as a Lawyer.  She quit smoking in October 2024 at 1+ pack per day for 15 years.  No alcohol intake.  Review of Systems: She denies bleeding.  No fevers or sweats.  No swollen, red, painful joints.  No unusual skin rash.  She is fatigued.  She has dyspnea on exertion.  No leg edema.  For the past 2 weeks she has noted intermittent discomfort behind the left knee mainly when she is walking.  No bowel or bladder issues.  Physical Exam:  Blood  pressure 108/70, pulse 73, temperature 97.7 F (36.5 C), temperature source Temporal, resp. rate 18, height 5\' 8"  (1.727 m), weight 261 lb 9.6 oz (118.7 kg), last menstrual period 02/06/2023, SpO2 98%.  Lungs: Lungs clear bilaterally. Cardiac: Regular rate and rhythm Abdomen: No hepatosplenomegaly.  Ecchymoses scattered over the lateral sides of the abdominal wall.  Some of the ecchymoses are tender and some probable associated small hematomas. Vascular: No arm or leg edema.  Nontender left calf.  Legs have a symmetric appearance. Lymph nodes: Palpable cervical, supraclavicular, axillary or inguinal lymph nodes Neurologic: Alert and oriented. Skin: Ecchymoses in various stages of resolution scattered over both sides of the abdominal wall, some with associated tenderness  and subcutaneous nodular lesions, likely hematomas.   LABS:  No results for input(s): "WBC", "HGB", "HCT", "PLT" in the last 72 hours.  No results for input(s): "NA", "K", "CL", "CO2", "GLUCOSE", "BUN", "CREATININE", "CALCIUM " in the last 72 hours.    RADIOLOGY:  ECHOCARDIOGRAM COMPLETE Result Date: 06/03/2023    ECHOCARDIOGRAM REPORT   Patient Name:   AIYONNA LUCADO Date of Exam: 06/03/2023 Medical Rec #:  [id number]                    Height:       68.0 in Accession #:    [id number]                   Weight:       270.3 lb Date of Birth:  04-17-1990                    BSA:          2.323 m Patient Age:    33 years                     BP:           104/57 mmHg Patient Gender: F                            HR:           61 bpm. Exam Location:  [facility] Procedure: 2D Echo, Cardiac Doppler, Color Doppler and Strain Analysis (Both            Spectral and Color Flow Doppler were utilized during procedure). Indications:     Murmur R01.1  History:         Patient has no prior history of Echocardiogram examinations.                  Signs/Symptoms:Murmur; Risk Factors:Sleep Apnea.  Sonographer:     Broadus Canes Referring Phys:   [id number] Theodora Fish Diagnosing Phys: Sammy Crisp MD  Sonographer Comments: Global longitudinal strain was attempted. IMPRESSIONS  1. Left ventricular ejection fraction, by estimation, is 60 to 65%. The left ventricle has normal function. The left ventricle has no regional wall motion abnormalities. Left ventricular diastolic parameters were normal. The average left ventricular global longitudinal strain is -17.9 %. The global longitudinal strain is normal.  2. Right ventricular systolic function is normal. The right ventricular size is normal. Tricuspid regurgitation signal is inadequate for assessing PA pressure.  3. The mitral valve is normal in structure. Trivial mitral valve regurgitation. No evidence of mitral stenosis.  4. The aortic valve is tricuspid. Aortic valve regurgitation is not visualized. No aortic stenosis is present. FINDINGS  Left Ventricle: Left ventricular ejection fraction, by estimation, is 60 to 65%. The left ventricle has normal function. The left ventricle has no regional wall motion abnormalities. The average left ventricular global longitudinal strain is -17.9 %. Strain was performed and the global longitudinal strain is normal. The left ventricular internal cavity size was normal in size. There is no left ventricular hypertrophy. Left ventricular diastolic parameters were normal. Right Ventricle: The right ventricular size is normal. No increase in right ventricular wall thickness. Right ventricular systolic function is normal. Tricuspid regurgitation signal is inadequate for assessing PA pressure. Left Atrium: Left atrial size was normal in size. Right Atrium: Right atrial size was normal in size. Pericardium: There is no evidence of pericardial effusion.  Mitral Valve: The mitral valve is normal in structure. Trivial mitral valve regurgitation. No evidence of mitral valve stenosis. MV peak gradient, 3.2 mmHg. The mean mitral valve gradient is 2.0 mmHg. Tricuspid Valve: The  tricuspid valve is normal in structure. Tricuspid valve regurgitation is trivial. Aortic Valve: The aortic valve is tricuspid. Aortic valve regurgitation is not visualized. No aortic stenosis is present. Aortic valve mean gradient measures 5.0 mmHg. Aortic valve peak gradient measures 7.2 mmHg. Aortic valve area, by VTI measures 3.24 cm. Pulmonic Valve: The pulmonic valve was not well visualized. Pulmonic valve regurgitation is not visualized. No evidence of pulmonic stenosis. Aorta: The aortic root is normal in size and structure. Pulmonary Artery: The pulmonary artery is not well seen. Venous: The inferior vena cava was not well visualized. IAS/Shunts: The interatrial septum was not well visualized.  LEFT VENTRICLE PLAX 2D LVIDd:         5.00 cm   Diastology LVIDs:         3.10 cm   LV e' medial:    13.60 cm/s LV PW:         0.90 cm   LV E/e' medial:  7.9 LV IVS:        0.90 cm   LV e' lateral:   17.00 cm/s LVOT diam:     2.10 cm   LV E/e' lateral: 6.4 LV SV:         91 LV SV Index:   39        2D Longitudinal Strain LVOT Area:     3.46 cm  2D Strain GLS Avg:     -17.9 %  RIGHT VENTRICLE RV Basal diam:  3.50 cm RV Mid diam:    3.00 cm LEFT ATRIUM           Index        RIGHT ATRIUM           Index LA diam:      2.90 cm 1.25 cm/m   RA Area:     11.50 cm LA Vol (A4C): 42.2 ml 18.17 ml/m  RA Volume:   24.40 ml  10.50 ml/m  AORTIC VALVE AV Area (Vmax):    3.08 cm AV Area (Vmean):   2.67 cm AV Area (VTI):     3.24 cm AV Vmax:           134.00 cm/s AV Vmean:          107.000 cm/s AV VTI:            0.281 m AV Peak Grad:      7.2 mmHg AV Mean Grad:      5.0 mmHg LVOT Vmax:         119.00 cm/s LVOT Vmean:        82.500 cm/s LVOT VTI:          0.263 m LVOT/AV VTI ratio: 0.94  AORTA Ao Root diam: 3.10 cm MITRAL VALVE MV Area (PHT): 3.39 cm     SHUNTS MV Area VTI:   3.25 cm     Systemic VTI:  0.26 m MV Peak grad:  3.2 mmHg     Systemic Diam: 2.10 cm MV Mean grad:  2.0 mmHg MV Vmax:       0.90 m/s MV Vmean:       61.8 cm/s MV Decel Time: 224 msec MV E velocity: 108.00 cm/s MV A velocity: 73.40 cm/s MV E/A ratio:  1.47 Veryl Gottron End MD Electronically signed by  Sammy Crisp MD Signature Date/Time: 06/03/2023/10:31:06 AM    Final    MR ANGIO HEAD WO CONTRAST Result Date: 06/01/2023 CLINICAL DATA:  Episodes with temporary loss of vision loss lasting 30-45 seconds. Vision has since normalized. EXAM: MRA HEAD WITHOUT CONTRAST TECHNIQUE: Angiographic images of the Circle of Willis were acquired using MRA technique without intravenous contrast. COMPARISON:  None Available. FINDINGS: Anterior circulation: The internal carotid arteries are within normal limits from high cervical segments through the ICA termini. The A1 and M1 segments are normal. The anterior communicating artery is patent. The MCA bifurcations are normal bilaterally. The ACA and MCA branch vessels are normal bilaterally. No aneurysm is present. Posterior circulation: The vertebral arteries are codominant. The right PICA origin is just below the field of view. Left AICA is dominant and normal. The vertebrobasilar junction basilar artery normal. The superior cerebellar arteries are patent bilaterally. Both posterior cerebral arteries originate from the basilar tip. The PCA branch vessels are normal bilaterally. No aneurysm is present. Anatomic variants: None IMPRESSION: Normal MRA Circle of Willis without significant proximal stenosis, aneurysm, or branch vessel occlusion. Electronically Signed   By: Audree Leas M.D.   On: 06/01/2023 15:01   MR BRAIN WO CONTRAST Result Date: 06/01/2023 CLINICAL DATA:  Headache. Transient episodes of vision loss. Vision has since normalized. EXAM: MRI HEAD WITHOUT CONTRAST TECHNIQUE: Multiplanar, multiecho pulse sequences of the brain and surrounding structures were obtained without intravenous contrast. COMPARISON:  None Available. FINDINGS: Brain: No acute infarct, hemorrhage, or mass lesion is present. No significant  white matter lesions are present. Deep brain nuclei are within normal limits. The ventricles are of normal size. No significant extraaxial fluid collection is present. The brainstem and cerebellum are within normal limits. The internal auditory canals are within normal limits. Midline structures are within normal limits. Vascular: Flow is present in the major intracranial arteries. Skull and upper cervical spine: The craniocervical junction is normal. Upper cervical spine is within normal limits. Marrow signal is unremarkable. Sinuses/Orbits: The paranasal sinuses and mastoid air cells are clear. The globes and orbits are within normal limits. IMPRESSION: Normal MRI appearance of the brain. No acute or focal lesion to explain the patient's symptoms. Electronically Signed   By: Audree Leas M.D.   On: 06/01/2023 14:58   US  Venous Img Upper Uni Left Result Date: 06/01/2023 CLINICAL DATA:  Left upper extremity pain and swelling following IV placement. EXAM: Left UPPER EXTREMITY VENOUS DOPPLER ULTRASOUND TECHNIQUE: Gray-scale sonography with graded compression, as well as color Doppler and duplex ultrasound were performed to evaluate the upper extremity deep venous system from the level of the subclavian vein and including the jugular, axillary, basilic, radial, ulnar and upper cephalic vein. Spectral Doppler was utilized to evaluate flow at rest and with distal augmentation maneuvers. COMPARISON:  None Available. FINDINGS: Internal Jugular Vein: No evidence of thrombus. Normal compressibility, respiratory phasicity and response to augmentation. Subclavian Vein: No evidence of thrombus. Normal compressibility, respiratory phasicity and response to augmentation. Axillary Vein: No evidence of thrombus. Normal compressibility, respiratory phasicity and response to augmentation. Cephalic Vein: The cephalic vein does not compress or demonstrate augmentation. Thrombus is present. Basilic Vein: The basilic vein does  not compress or demonstrate augmentation. Thrombus present. Brachial Veins: No evidence of thrombus. Normal compressibility, respiratory phasicity and response to augmentation. Radial Veins: No evidence of thrombus. Normal compressibility, respiratory phasicity and response to augmentation. Ulnar Veins: No evidence of thrombus. Normal compressibility, respiratory phasicity and response to augmentation. Venous Reflux:  None  visualized. Other Findings:  None visualized. IMPRESSION: 1. Thrombus in the left cephalic and basilic veins. Critical Value/emergent results were called by telephone at the time of interpretation on 06/01/2023 at 2:33 pm to provider [facility] , who verbally acknowledged these results. Electronically Signed   By: Audree Leas M.D.   On: 06/01/2023 14:36    Assessment and Plan:   Left upper extremity thrombus in the left cephalic and basilic veins on Doppler ultrasound 06/01/2023, treated as DVT due to the extensive superficial thrombus, Lovenox  recommended throughout remainder of pregnancy and 6 weeks postpartum History of "phlebitis" after lab draws Pregnancy, 20 weeks at present PCOS Plantar fasciitis Sleep apnea Asthma Anxiety/depression Family history of breast cancer  Ms. Forgey was referred for evaluation after being found to have an extensive left upper extremity superficial thrombus.  The thrombus occurred following an IV in the left arm and in the setting of pregnancy.  In addition she has a history of "phlebitis" after at least 2 lab draws.  She is now maintained on therapeutic Lovenox  with the plan to continue anticoagulation for the remainder of the pregnancy and 6 weeks postpartum.  We reviewed risk factors including pregnancy, recent IV and history of phlebitis.  Antiphospholipid syndrome panel returned negative on 06/02/2023.  A full hypercoagulation panel will be obtained at the next office visit.  Several family numbers have been diagnosed  with breast cancer.  We discussed a referral to the genetic counselor.  She would like to hold on this for now and discuss further at her next visit.  She will return for lab and follow-up in early December 2025.  Patient seen with Dr. Scherrie Curt.    Diana Forster, NP 06/25/2023, 10:55 AM  This was a shared visit with Diana Forster.  Mr. Dominic Buck was interviewed and examined.  She is referred for hematology evaluation after a recent diagnosis of extensive left upper extremity superficial venous thrombosis.  This occurred in the arm where an IV had been placed 1-2 weeks prior to the superficial thrombosis diagnosis.  The case was discussed with vascular surgery and the hematology service while she was at [facility].  Full dose Lovenox  anticoagulation was recommended due to the extent of superficial thrombosis and ongoing clotting risk.  An antiphospholipid panel was negative.  Her risk factors for thrombosis include the personal history of superficial phlebitis with with IV puncture in the past, obesity, and pregnancy.  Symptoms related to the current left upper extremity venous thrombosis have resolved.  She has difficulty with the Lovenox  injections.  I recommend 3 months of anticoagulation therapy to be followed by Lovenox  prophylaxis throughout and following pregnancy. An antiphospholipid panel was negative on 06/02/2023.  She will return for an office visit approximately 8 weeks postpartum for a full hypercoagulation panel.  I was present for greater than 50% of today's visit.  I performed medical decision making.  Anise Kerns, MD

## 2023-06-25 NOTE — Telephone Encounter (Signed)
 Patient called the after hour center and states she taking Lovenox  injection q12h, [redacted] weeks pregnant, had reaction last night, has huge knot at injection site on l abd 6 inches away from navel, size gum ball, hot to tough, denies redness, denies fever, is clammy. I spoke with the patient and she states she went to the urgent. I informed the patient that the injection site was incorrect for Lovenox  administration. The medication should be administered in the side of the abdomen.

## 2023-06-26 ENCOUNTER — Ambulatory Visit: Admitting: *Deleted

## 2023-06-26 ENCOUNTER — Ambulatory Visit: Admitting: Obstetrics and Gynecology

## 2023-06-26 ENCOUNTER — Encounter: Payer: Self-pay | Admitting: *Deleted

## 2023-06-26 ENCOUNTER — Other Ambulatory Visit: Payer: Self-pay | Admitting: Obstetrics and Gynecology

## 2023-06-26 ENCOUNTER — Other Ambulatory Visit: Payer: Self-pay | Admitting: *Deleted

## 2023-06-26 ENCOUNTER — Ambulatory Visit (HOSPITAL_BASED_OUTPATIENT_CLINIC_OR_DEPARTMENT_OTHER)

## 2023-06-26 ENCOUNTER — Ambulatory Visit: Attending: Obstetrics and Gynecology

## 2023-06-26 VITALS — BP 107/67 | HR 83 | Wt 262.0 lb

## 2023-06-26 VITALS — BP 113/55 | HR 70

## 2023-06-26 DIAGNOSIS — G459 Transient cerebral ischemic attack, unspecified: Secondary | ICD-10-CM

## 2023-06-26 DIAGNOSIS — Z8673 Personal history of transient ischemic attack (TIA), and cerebral infarction without residual deficits: Secondary | ICD-10-CM | POA: Diagnosis not present

## 2023-06-26 DIAGNOSIS — Z6841 Body Mass Index (BMI) 40.0 and over, adult: Secondary | ICD-10-CM | POA: Insufficient documentation

## 2023-06-26 DIAGNOSIS — E669 Obesity, unspecified: Secondary | ICD-10-CM | POA: Diagnosis not present

## 2023-06-26 DIAGNOSIS — F4310 Post-traumatic stress disorder, unspecified: Secondary | ICD-10-CM

## 2023-06-26 DIAGNOSIS — O09213 Supervision of pregnancy with history of pre-term labor, third trimester: Secondary | ICD-10-CM

## 2023-06-26 DIAGNOSIS — E6689 Other obesity not elsewhere classified: Secondary | ICD-10-CM | POA: Insufficient documentation

## 2023-06-26 DIAGNOSIS — O09892 Supervision of other high risk pregnancies, second trimester: Secondary | ICD-10-CM

## 2023-06-26 DIAGNOSIS — J4520 Mild intermittent asthma, uncomplicated: Secondary | ICD-10-CM

## 2023-06-26 DIAGNOSIS — O99212 Obesity complicating pregnancy, second trimester: Secondary | ICD-10-CM | POA: Insufficient documentation

## 2023-06-26 DIAGNOSIS — I82409 Acute embolism and thrombosis of unspecified deep veins of unspecified lower extremity: Secondary | ICD-10-CM | POA: Diagnosis present

## 2023-06-26 DIAGNOSIS — O0990 Supervision of high risk pregnancy, unspecified, unspecified trimester: Secondary | ICD-10-CM | POA: Insufficient documentation

## 2023-06-26 DIAGNOSIS — Z3A20 20 weeks gestation of pregnancy: Secondary | ICD-10-CM

## 2023-06-26 DIAGNOSIS — Z87898 Personal history of other specified conditions: Secondary | ICD-10-CM

## 2023-06-26 DIAGNOSIS — Z8751 Personal history of pre-term labor: Secondary | ICD-10-CM | POA: Insufficient documentation

## 2023-06-26 DIAGNOSIS — F319 Bipolar disorder, unspecified: Secondary | ICD-10-CM

## 2023-06-26 DIAGNOSIS — O2230 Deep phlebothrombosis in pregnancy, unspecified trimester: Secondary | ICD-10-CM | POA: Insufficient documentation

## 2023-06-26 DIAGNOSIS — Z141 Cystic fibrosis carrier: Secondary | ICD-10-CM | POA: Insufficient documentation

## 2023-06-26 DIAGNOSIS — O0992 Supervision of high risk pregnancy, unspecified, second trimester: Secondary | ICD-10-CM | POA: Diagnosis not present

## 2023-06-26 DIAGNOSIS — F17210 Nicotine dependence, cigarettes, uncomplicated: Secondary | ICD-10-CM

## 2023-06-26 DIAGNOSIS — F419 Anxiety disorder, unspecified: Secondary | ICD-10-CM

## 2023-06-26 DIAGNOSIS — R7309 Other abnormal glucose: Secondary | ICD-10-CM

## 2023-06-26 DIAGNOSIS — O99342 Other mental disorders complicating pregnancy, second trimester: Secondary | ICD-10-CM | POA: Diagnosis not present

## 2023-06-26 DIAGNOSIS — I82612 Acute embolism and thrombosis of superficial veins of left upper extremity: Secondary | ICD-10-CM | POA: Insufficient documentation

## 2023-06-26 NOTE — Progress Notes (Signed)
 Pt was seen at Urgent care after problem with Lovenox  injection.  Pt has questions about delivery and possible BTL.

## 2023-06-26 NOTE — Progress Notes (Signed)
 Maternal-Fetal Medicine Consultation Name: Jaquaya Coyle MRN: [id number]  B2 [id number] at 20-weeks' gestation.  Patient is here for fetal anatomy scan.  Her high risk problems include: - Transient ischemic attack and deep vein thrombosis.  About 3 weeks ago, patient presented to the ED with vision loss and left upper extremity swelling and pain.  MRA and MRI without contrast showed no structural lesions and no evidence of thrombosis.  A diagnosis of transient ischemic attack was made.  Ultrasound showed thrombus in the left cephalic and basilic vein. Patient takes therapeutic Lovenox  120 mg twice daily.  She had a consultation with hematologist.  Antiphospholipid antibodies workup was negative.  Patient was recommended to continue Lovenox  throughout her pregnancy and up to 6 weeks postpartum. Maternal echocardiography was performed and reported as normal.  -History of preterm delivery.  Patient gives history of preterm delivery 11 years ago at [redacted] weeks gestation.  Subsequently, in 2020, she had a term vaginal delivery.  She took intramuscular progesterone  prophylaxis since her second pregnancy.  Currently, she is taking vaginal progesterone  200 mg daily.  Cystic fibrosis carrier.  This pregnancy is from a different partner and he accompanied our patient today.  He has cardiomyopathy. The couple met with our genetic counselor after ultrasound. You will be receiving a separate letter from our genetic counselor.  Obstructive sleep apnea. Patient had stopped using CPAP machine and has a consultation with sleep specialist.  Ultrasound We performed fetal anatomy scan. No makers of aneuploidies or fetal structural defects are seen. Some cardiac views could not be obtained because of fetal position. Fetal biometry is consistent with her previously-established dates. Amniotic fluid is normal and good fetal activity is seen. Patient understands the limitations of ultrasound in detecting fetal  anomalies.  Because of a history of preterm delivery, we performed a transvaginal ultrasound to evaluate the cervix.  The cervix measures 4.6 cm, which is normal.  No shortening or funneling was seen on transfundal pressure. As maternal obesity imposes limitations on the resolution of images, fetal anomalies may be missed.  Our concerns include: History of TIA and Thrombosis As pregnancy is a hypercoagulable state, patient should be anticoagulated. Ideally, therapeutic lovenox  should continue through the pregnancy and up to 6 weeks postpartum. Patient would like to discontinue twice-daily regimen before delivery.  She understands that unfractionated heparin will be recommended at 36 weeks' gestation.  History of preterm delivery Patient is aware that Makena had been withdrawn for use in pregnancy after FDA disapproval.  Patient takes vaginal progesterone  treatment.  I reassured the patient of normal cervical length measurement.  Vaginal progesterone  in patient with history of preterm delivery is controversial.  Cervical length of greater than 3 cm is associated with a lower risk of preterm delivery.  Patient was counseled on the option to discontinue vaginal progesterone  or continue till [redacted] weeks gestation. She would like to continue vaginal progesterone .  Obstructive sleep apnea OSA is associated with increased risk of preeclampsia and gestational diabetes.  There is a small increased risk of venous thromboembolism and cardiomyopathy.  Long-term risks include hypertension. During labor, administration of narcotics and sedatives can cause apnea.  I recommend anesthesiology consultation.  Recommendations -An appointment was made for her to return in 4 weeks for completion of fetal anatomy. - Fetal growth assessments every 4 weeks till delivery. - Weekly antenatal testing from [redacted] weeks gestation until delivery. - Anesthesiology consultation in the third trimester (obstructive sleep apnea). -Her  partner had his blood drawn today  for carrier screening (cystic fibrosis).  Our genetic counselor will communicate the results to the patient and her partner.   Consultation including face-to-face (more than 50%) counseling 45 minutes.

## 2023-06-26 NOTE — Progress Notes (Signed)
 [facility] at [facility] [address] Phone:  [phone]   Fax:  [phone]      In-Person Genetic Counseling Clinic Note:   I spoke with 33 y.o. Taylor Buck today to discuss her carrier screening results. She was referred by Davis, Devon E, PA-C. She was accompanied by FOB Justus.   Pregnancy History:    [id number]. EGA: [redacted]w[redacted]d by LMP. EDD: 11/13/2023. [name] has two healthy children from a previous partnership. She had two early miscarriages, no known associated health or genetic causes. Personal history of DVT and TIA. She is being followed by hematology. Patient also reports personal history of mental health conditions. Denies other health concerns. Denies bleeding, infections, and fevers in this pregnancy. Denies using tobacco, alcohol, or street drugs in this pregnancy.   Family History:    A three-generation pedigree was created and scanned into Epic under the Media tab.  Clodagh reports she and her 16 yo sister have fetal alcohol spectrum disorder. Her sister was born premature and also has cerebral palsy with intellectual disability. She is in assisted living. We discussed cerebral palsy can be due to genetics and/or the environment. Yoneko reports that it is suspected her sister's symptoms were due to maternal exposure and environmental causes. Without genetic testing reports, recurrence risk is difficult to estimate.  FOB reports he was born with a CHD. He is a cardiomyopathy with heart failure. He has had 6-7 surgeries. He reports that this was likely caused by his mother having a lupus flare during pregnancy. We reviewed that CHDs can be isolated or a feature of an underlying genetic condition. Congenital heart defects are most often multifactorial in etiology, but can also result from chromosome aberrations, single gene conditions, or teratogenic exposures. Isolated, nonsyndromic CHDs occur in approximately  1% of the general population. Since FOB has a CHD, recurrence risk is estimated to be 4% unless an underlying genetic condition is present. Teshia was referred for a fetal echocardiogram.    We reviewed certain conditions such as diabetes, lupus, and POTS are typically multifactorial in nature and not due to a single genetic cause. Having a positive family history increases the chance of having an affected child; however, recurrence risk is difficult to estimate.  Maternal ethnicity reported as White and paternal ethnicity reported as White. Denies Ashkenazi Jewish ancestry.  Family history not remarkable for consanguinity, individuals with birth defects, intellectual disability, autism spectrum disorder, multiple spontaneous abortions, still births, or unexplained neonatal death.   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 We discussed she does not have CF; however, some carriers may have mild symptoms. Aiko was not found to be a carrier for alpha thalassemia, beta-hemoglobinopathies, or spinal muscular atrophy. Please see report for details. A negative result on carrier screening reduces but does not eliminate the chance of being a carrier.  We reviewed genes, autosomal recessive inheritance of CF, and the clinical features of CF. We reviewed there would be a 25% chance the pregnancy would be affected if FOB is also found to be a carrier. If both members of a couple are known to be carriers, diagnostic testing through amniocentesis is available to determine if the pregnancy is affected. The benefits, risks, and limitations of amniocentesis including the 1 in 500 risk for miscarriage were  reviewed. We also discussed that CF is included on [place] 's Newborn Screening Program. The newborn screening test utilizes trypsinogen levels  rather than genetic testing to detect affected individuals whereas genetic testing can determine the specific variants, if any, that a child may have inherited. Therefore, the couple may also seek postnatal genetic counseling and testing if needed or desired.   Given these results, we discussed and offered carrier screening for Arohi's reproductive partner. We reviewed the benefits and limitations of carrier screening and that it can detect most but not all carriers. FOB elected carrier screening for CF. He consented we cal Sharda with the results. Blood draw was unsuccessful during today's visit. He is scheduled to return tomorrow 06/27/2023.  In the meantime, we calculated the risk of CF to the pregnancy based on our current knowledge of Laquitta's test results and FOB's ethnicity. The chance that the fetus would be affected with CF would be 1 in 100 (1%). Carrier screening for FOB will allow us  to provide a more accurate risk to the fetus.    Newborn Screening. The [place]  Newborn Screening (NBS) program will screen all newborn babies for cystic fibrosis, spinal muscular atrophy, hemoglobinopathies, and numerous other conditions.  Previous Testing Completed:  Low risk NIPS: Jericka previously completed noninvasive prenatal screening (NIPS) in this pregnancy. The result is low risk, consistent with a female fetus. This screening significantly reduces but does not eliminate the chance that the current pregnancy has Down syndrome (trisomy 78), trisomy 50, trisomy 48, common sex chromosome conditions, and 22q11.2 microdeletion syndrome. Please see report for details. There are many genetic conditions that cannot be detected by NIPS.   Negative ms-AFP screening: Brinleigh previously completed a maternal serum AFP screen in this pregnancy. The result is screen negative. Please see report for details. A negative result reduces the risk that the current pregnancy has an open neural tube defect. Closed neural tube  defects and some open defects may not be detected by this screen.   Plan of Care:   FOB carrier screening for CF scheduled for 06/27/2023. We will Ledora with the results. Referral for fetal echocardiogram was sent. Follow-up ultrasound at [facility] has been scheduled.   Informed consent was obtained. All questions were answered.   60 minutes were spent on the date of the encounter in service to the patient including preparation, face-to-face consultation, discussion of test reports and available next steps, pedigree construction, genetic risk assessment, documentation, and care coordination.    Thank you for sharing in the care of Lakashia with us .  Please do not hesitate to contact us  at [phone] if you have any questions.   Georgean Kindle, MS, [facility] Certified Genetic Counselor   Genetic counseling student involved in appointment: No.

## 2023-06-26 NOTE — Progress Notes (Signed)
 PRENATAL VISIT NOTE  Subjective:  Taylor Buck is a 33 y.o. [id number] at [redacted]w[redacted]d being seen today for ongoing prenatal care.  She is currently monitored for the following issues for this high-risk pregnancy and has History of preterm delivery; Bipolar 1 disorder (HCC); History of substance use disorder; Trichomonas vaginitis; Asthma; Family history of breast cancer; Nicotine  dependence, cigarettes, uncomplicated; Obstructive sleep apnea; PCOS (polycystic ovarian syndrome); Posttraumatic stress disorder; Supervision of high risk pregnancy, antepartum; Cystic fibrosis carrier; Pathological nipple discharge; BMI 40.0-44.9, adult (HCC); Abnormal glucose tolerance test; TIA (transient ischemic attack); Anxiety disorder affecting pregnancy, antepartum; and Superficial venous thrombosis of arm, left on their problem list.  Patient reports doing ok. Gets bruising at lovenox  injection site. Interested in BTL, does not want to get pregnant again. Worried about delivery and being on blood thinner.  Contractions: Not present. Vag. Bleeding: None.  Movement: Present. Denies leaking of fluid.   The following portions of the patient's history were reviewed and updated as appropriate: allergies, current medications, past family history, past medical history, past social history, past surgical history and problem list.   Objective:   Vitals:   06/26/23 0903  BP: 107/67  Pulse: 83  Weight: 262 lb (118.8 kg)    Fetal Status: Fetal Heart Rate (bpm): 140   Movement: Present     General:  Alert, oriented and cooperative. Patient is in no acute distress.  Skin: Skin is warm and dry. No rash noted.   Cardiovascular: Normal heart rate noted  Respiratory: Normal respiratory effort, no problems with respiration noted  Abdomen: Soft, gravid, appropriate for gestational age.  Pain/Pressure: Absent      Assessment and Plan:  Pregnancy: [id number] at [redacted]w[redacted]d 1. Supervision of high risk pregnancy, antepartum  (Primary) 2. [redacted] weeks gestation of pregnancy MFM/anatomy US  scheduled for today  3. TIA (transient ischemic attack) 15. Superficial venous thrombosis Hospitalized at [facility] 5/4-5/6 (@16  wk) for TIA and extensive superficial venous thrombosis Normal echo & brain MRI Negative APAS testing Therapeutic lovenox  through pregnancy & 6 weeks postpartum S/p heme - next appt postpartum 12/2 Discussed IOL at 39 weeks if no spontaneous labor prior to try to time stopping tLov to allow for neuraxial anesthesia Wants to breast feed but switch to oral AC. Interested in pumping and dumping to maintain supply until she can stop PO AC  4. Bipolar 1 disorder (HCC) 5. Posttraumatic stress disorder 6. Anxiety disorder affecting pregnancy, antepartum Saw [facility] 5/8, but did not get full eval Feeling better on abilify  Sees PCP next week for further management and to re-establish care with psychiatry   7. Nicotine  dependence, cigarettes, uncomplicated Previously smoking 1/2ppd - reports cessation, not using nicotine  replacement  8. History of substance use disorder Reports opioid & benozodiazepine use in the past, but has not used in 8 years No current MAT  9. History of preterm delivery 34 weeks On vaginal progesterone   10. Cystic fibrosis carrier Partner test info given today  11. BMI 40.0-44.9, adult (HCC) 13. Abnormal glucose tolerance test NOB A1c 5.9 > normal 2h Routine 2h at 26-28 weeks  12. Mild intermittent asthma without complication 14. OSA Using albuterol  BID and dulera - discussed medication change She will review with PCP, if no changes made she will let us  know so we can titrate  16. History of heart murmur Normal echo 06/03/23  17. Sterilization consult Interested in BTL. Discussed LARC, vasectomy. Reviewed surgical risks. Not candidate for PP BTL based on habitus.  Will sign medicaid papers in case of CS Plan for interval IUD   Please refer to  After Visit Summary for other counseling recommendations.   Return in about 4 weeks (around 07/24/2023) for return OB at 24 weeks - MD only.  Future Appointments  Date Time Provider Department Center  06/26/2023 10:15 AM [facility] NURSE [facility]  06/26/2023 10:30 AM [facility] US1 [facility]  06/26/2023 11:00 AM [facility] PROVIDER 1 [facility]  12/30/2023 10:00 AM [facility] PHLEBOTOMIST [facility] None  12/30/2023 10:20 AM Scherrie Curt Maurine Sovereign, MD [facility] None    Izell Marsh, MD

## 2023-07-03 ENCOUNTER — Emergency Department (HOSPITAL_COMMUNITY)

## 2023-07-03 ENCOUNTER — Emergency Department (HOSPITAL_COMMUNITY)
Admission: EM | Admit: 2023-07-03 | Discharge: 2023-07-03 | Disposition: A | Discharge status: Home / Self Care | Attending: Emergency Medicine | Admitting: Emergency Medicine

## 2023-07-03 ENCOUNTER — Other Ambulatory Visit: Payer: Self-pay

## 2023-07-03 ENCOUNTER — Encounter (HOSPITAL_COMMUNITY): Payer: Self-pay | Admitting: Emergency Medicine

## 2023-07-03 DIAGNOSIS — R799 Abnormal finding of blood chemistry, unspecified: Secondary | ICD-10-CM | POA: Insufficient documentation

## 2023-07-03 DIAGNOSIS — Z3A21 21 weeks gestation of pregnancy: Secondary | ICD-10-CM | POA: Diagnosis not present

## 2023-07-03 DIAGNOSIS — Z91040 Latex allergy status: Secondary | ICD-10-CM | POA: Diagnosis not present

## 2023-07-03 DIAGNOSIS — G459 Transient cerebral ischemic attack, unspecified: Secondary | ICD-10-CM | POA: Insufficient documentation

## 2023-07-03 DIAGNOSIS — O26892 Other specified pregnancy related conditions, second trimester: Secondary | ICD-10-CM | POA: Insufficient documentation

## 2023-07-03 LAB — COMPREHENSIVE METABOLIC PANEL WITH GFR
ALT: 13 U/L (ref 0–44)
AST: 12 U/L — ABNORMAL LOW (ref 15–41)
Albumin: 2.9 g/dL — ABNORMAL LOW (ref 3.5–5.0)
Alkaline Phosphatase: 64 U/L (ref 38–126)
Anion gap: 10 (ref 5–15)
BUN: 7 mg/dL (ref 6–20)
CO2: 20 mmol/L — ABNORMAL LOW (ref 22–32)
Calcium: 9.1 mg/dL (ref 8.9–10.3)
Chloride: 104 mmol/L (ref 98–111)
Creatinine, Ser: 0.91 mg/dL (ref 0.44–1.00)
GFR, Estimated: >60 mL/min (ref >60)
Glucose, Bld: 86 mg/dL (ref 70–99)
Potassium: 3.7 mmol/L (ref 3.5–5.1)
Sodium: 134 mmol/L — ABNORMAL LOW (ref 135–145)
Total Bilirubin: 0.3 mg/dL (ref 0.0–1.2)
Total Protein: 6.3 g/dL — ABNORMAL LOW (ref 6.5–8.1)

## 2023-07-03 LAB — URINALYSIS, ROUTINE W REFLEX MICROSCOPIC
Bilirubin Urine: NEGATIVE
Glucose, UA: NEGATIVE mg/dL
Hgb urine dipstick: NEGATIVE
Ketones, ur: 5 mg/dL — AB
Leukocytes,Ua: NEGATIVE
Nitrite: NEGATIVE
Protein, ur: NEGATIVE mg/dL
Specific Gravity, Urine: 1.015 (ref 1.005–1.030)
pH: 7 (ref 5.0–8.0)

## 2023-07-03 LAB — PROTIME-INR
INR: 1.1 (ref 0.8–1.2)
Prothrombin Time: 14.4 s (ref 11.4–15.2)

## 2023-07-03 LAB — CBC
HCT: 37.1 % (ref 36.0–46.0)
Hemoglobin: 11.9 g/dL — ABNORMAL LOW (ref 12.0–15.0)
MCH: 26.3 pg (ref 26.0–34.0)
MCHC: 32.1 g/dL (ref 30.0–36.0)
MCV: 81.9 fL (ref 80.0–100.0)
Platelets: 267 10*3/uL (ref 150–400)
RBC: 4.53 MIL/uL (ref 3.87–5.11)
RDW: 15 % (ref 11.5–15.5)
WBC: 10.7 10*3/uL — ABNORMAL HIGH (ref 4.0–10.5)
nRBC: 0 % (ref 0.0–0.2)

## 2023-07-03 LAB — HCG, QUANTITATIVE, PREGNANCY: hCG, Beta Chain, Quant, S: 2337 m[IU]/mL — ABNORMAL HIGH (ref <5)

## 2023-07-03 LAB — CBG MONITORING, ED: Glucose-Capillary: 104 mg/dL — ABNORMAL HIGH (ref 70–99)

## 2023-07-03 LAB — ETHANOL: Alcohol, Ethyl (B): <15 mg/dL (ref <15)

## 2023-07-03 NOTE — ED Triage Notes (Signed)
 Pt BIBA from grocery store w/ c/o of L sided weakness/HA/blurred vsion. Pt stated around noon when she was shopping that her face "felt funny." Then symptoms resolved and started to drive home which she then had blurred vision. She pulled over and called 911. Currently pt does not have any active symptoms just a HA 7/10 for pain. Pt had a TIA on May 2nd and is on plavix for that. Pt also has known blood clots in L arm. Also [redacted] weeks pregnant

## 2023-07-03 NOTE — Discharge Instructions (Signed)
 Please call the neurology clinic listed above to schedule an appointment.  Please also follow-up with PCP in the next 2 to 3 days Thank you for allowing us  to take care of you today.  We hope you begin feeling better soon.   To-Do:  Please follow-up with your primary doctor within the next 2-3 days. Please return to the Emergency Department or call 911 if you experience chest pain, shortness of breath, severe pain, severe fever, altered mental status, or have any reason to think that you need emergency medical care.  Thank you again.  Hope you feel better soon.  Arlin Benes Department of Emergency Medicine

## 2023-07-03 NOTE — Progress Notes (Signed)
 G5P2 at 21 weeks reports to [place] with c/o "floaters in vision then went dark and had numbness in left side".  Does have HA.  VSS.  Reflexes WNL/No clonus.  No s/s of PIH other than "floaters".  No history of PIH with previous 2 pregnancies. Has an appointment with [facility] on 6/26.  Call office with any questions or concerns.  Dr Rayfield Cairo aware of patient status.  FHT's 140 via doppler.  Cleared by OB Service.  Virlinda Grimmer RNC-OB  RROB [phone]

## 2023-07-03 NOTE — ED Notes (Signed)
 Rapid response OB RN called and notified for need of fetal assessment

## 2023-07-03 NOTE — ED Provider Notes (Signed)
 [place] EMERGENCY DEPARTMENT AT [facility] Provider Note   CSN: [id number] Arrival date & time: 07/03/23  1257     History  Chief Complaint  Patient presents with   Weakness    Taylor Buck is a 33 y.o. female.  The history is provided by the patient, medical records and the EMS personnel. No language interpreter was used.  Weakness    33 year old female history of anxiety, depression, PCOS, bipolar who is currently [redacted] weeks pregnant presenting with strokelike symptoms.  Patient's last known normal was less than 4 hours ago.  She was at the grocery store shopping when she experienced vision changes in which she described spots in her vision and blurry vision.  She also experiencing tingling sensation to her face.  She decided to drive home when her vision became worse and she had a transient 2 minutes vision loss and her symptoms resolved.  She then noticed some throbbing frontal headache.  She also complaining of tingling sensation to her lips and her left arm and feet worse concerns here.  Once EMS brought patient to the ER, her symptom has mostly resolved.  She does not endorse any focal weakness she denies any fever or productive cough nausea vomiting lightheadedness or dizziness.  EMR reviewed, approximately 3 weeks ago patient was seen in the ED for acute vision loss and left upper extremity swelling and pain.  She was diagnosed with TIA after that she had a negative MRI and MRI of her brain however her ultrasound of her left upper extremity did demonstrate evidence of thrombosis of the left cephalic and basilic vein.  She is currently on therapeutic Lovenox  120 mg twice daily  Home Medications Prior to Admission medications   Medication Sig Start Date End Date Taking? Authorizing Provider  acetaminophen  (TYLENOL ) 325 MG tablet Take 650 mg by mouth daily as needed for pain.    [provider]  albuterol  (VENTOLIN  HFA) 108 (90 Base) MCG/ACT  inhaler Inhale 2 puffs into the lungs every 6 (six) hours as needed for wheezing or shortness of breath.    [provider]  ARIPiprazole  (ABILIFY ) 10 MG tablet Take 1 tablet (10 mg total) by mouth daily. 05/29/23   Izell Marsh, MD  Blood Pressure Monitoring (BLOOD PRESSURE KIT) DEVI 1 Device by Does not apply route once a week. 03/28/23   Constant, Peggy, MD  calcium  carbonate (TUMS EX) 750 MG chewable tablet Chew 1 tablet by mouth at bedtime as needed (for nausea).    [provider]  enoxaparin  (LOVENOX ) 120 MG/0.8ML injection Inject 0.8 mLs (120 mg total) into the skin every 12 (twelve) hours. 06/02/23 12/25/23  Dezii, Alexandra, DO  mometasone-formoterol (DULERA) 100-5 MCG/ACT AERO Inhale 2 puffs into the lungs daily. 10/03/22   [provider]  Multiple Vitamins-Minerals (MULTIVITAMIN WITH MINERALS) tablet Take 1 tablet by mouth daily.    [provider]  ondansetron  (ZOFRAN -ODT) 4 MG disintegrating tablet Take 1 tablet (4 mg total) by mouth every 6 (six) hours as needed for nausea. 06/18/23   Harlee Lichtenstein, CNM  pantoprazole  (PROTONIX ) 20 MG tablet Take 1 tablet (20 mg total) by mouth daily. 06/18/23   Harlee Lichtenstein, CNM  Prenatal Vit-Fe Phos-FA-Omega (VITAFOL  GUMMIES) 3.33-0.333-34.8 MG CHEW CHEW 1 TABLET BY MOUTH EVERY DAY 03/11/23   [name], [name], MD  progesterone  (PROMETRIUM ) 200 MG capsule Place one capsule vaginally at bedtime 03/11/23   [name], [name], MD  sucralfate  (CARAFATE ) 1 GM/10ML suspension Take  10 mLs (1 g total) by mouth 4 (four) times daily -  with meals and at bedtime. 06/18/23   Harlee Lichtenstein, CNM      Allergies    Milk (cow), Milk-related compounds, Lorazepam, Latex, Penicillins, Phenergan  [promethazine  hcl], and Reglan  [metoclopramide ]    Review of Systems   Review of Systems  Neurological:  Positive for weakness.  All other systems reviewed and are negative.   Physical Exam Updated Vital Signs BP (!) 104/59 (BP  Location: Right Arm)   Pulse 69   Temp 98.6 F (37 C) (Oral)   Resp 20   Ht 5\' 8"  (1.727 m)   Wt 117.9 kg   LMP 02/06/2023 (Approximate)   SpO2 96%   BMI 39.53 kg/m  Physical Exam Vitals and nursing note reviewed.  Constitutional:      General: She is not in acute distress.    Appearance: She is well-developed.  HENT:     Head: Atraumatic.  Eyes:     Extraocular Movements: Extraocular movements intact.     Conjunctiva/sclera: Conjunctivae normal.     Pupils: Pupils are equal, round, and reactive to light.  Cardiovascular:     Rate and Rhythm: Normal rate and regular rhythm.     Pulses: Normal pulses.     Heart sounds: Normal heart sounds.  Pulmonary:     Effort: Pulmonary effort is normal.  Abdominal:     Comments: Gravid abdomen nontender to palpation  Musculoskeletal:     Cervical back: Normal range of motion and neck supple.  Skin:    Findings: No rash.  Neurological:     Mental Status: She is alert and oriented to person, place, and time.     Comments: Neurologic exam:  Speech clear, pupils equal round reactive to light, extraocular movements intact  Normal peripheral visual fields Cranial nerves III through XII normal including no facial droop Follows commands, moves all extremities x4, normal strength to bilateral upper and lower extremities at all major muscle groups including grip Sensation normal to light touch No pronator drift Gait not tested   Psychiatric:        Mood and Affect: Mood normal.     ED Results / Procedures / Treatments   Labs (all labs ordered are listed, but only abnormal results are displayed) Labs Reviewed  CBC - Abnormal; Notable for the following components:      Result Value   WBC 10.7 (*)    Hemoglobin 11.9 (*)    All other components within normal limits  COMPREHENSIVE METABOLIC PANEL WITH GFR - Abnormal; Notable for the following components:   Sodium 134 (*)    CO2 20 (*)    Total Protein 6.3 (*)    Albumin 2.9 (*)     AST 12 (*)    All other components within normal limits  URINALYSIS, ROUTINE W REFLEX MICROSCOPIC - Abnormal; Notable for the following components:   APPearance HAZY (*)    Ketones, ur 5 (*)    All other components within normal limits  HCG, QUANTITATIVE, PREGNANCY - Abnormal; Notable for the following components:   hCG, Beta Chain, Quant, S 2,337 (*)    All other components within normal limits  ETHANOL  PROTIME-INR  RAPID URINE DRUG SCREEN, HOSP PERFORMED    EKG None  Radiology No results found.  Procedures Procedures    Medications Ordered in ED Medications - No data to display  ED Course/ Medical Decision Making/ A&P Clinical Course as of 07/03/23 1533  Thu Jul 03, 2023  1529 Possible TIA 21weeks preg, recent TIA w/ neg MRI and MRA. Dvt LUE on lovenox  +HA-improved here Blurry vision followed by vision loss 2 min and tingling left side and lips for 8 min. All resolved Neuro rev MRI and MRV [ ]  MRI/MRV [TS]    Clinical Course User Index [TS] Angele Keller, DO                                 Medical Decision Making  BP (!) 104/59 (BP Location: Right Arm)   Pulse 69   Temp 98.6 F (37 C) (Oral)   Resp 20   Ht 5\' 8"  (1.727 m)   Wt 117.9 kg   LMP 02/06/2023 (Approximate)   SpO2 96%   BMI 39.53 kg/m   13:59 PM   33 year old female history of anxiety, depression, PCOS, bipolar who is currently [redacted] weeks pregnant presenting with strokelike symptoms.  Patient's last known normal was less than 4 hours ago.  She was at the grocery store shopping when she experienced vision changes in which she described spots in her vision and blurry vision.  She also experiencing tingling sensation to her face.  She decided to drive home when her vision became worse and she had a transient 2 minutes vision loss and her symptoms resolved.  She then noticed some throbbing frontal headache.  She also complaining of tingling sensation to her lips and her left arm and feet worse concerns  here.  Once EMS brought patient to the ER, her symptom has mostly resolved.  She does not endorse any focal weakness she denies any fever or productive cough nausea vomiting lightheadedness or dizziness.  EMR reviewed, approximately 3 weeks ago patient was seen in the ED for acute vision loss and left upper extremity swelling and pain.  She was diagnosed with TIA after that she had a negative MRI and MRI of her brain however her ultrasound of her left upper extremity did demonstrate evidence of thrombosis of the left cephalic and basilic vein.  She is currently on therapeutic Lovenox  120 mg twice daily  On exam, patient is resting comfortably appears to be in no acute discomfort.  She has normal visual acuity.  She does not have any focal neurodeficit.  Her strength are equal throughout.  Her sensation is intact throughout.  Heart with normal rate rhythm, lungs are clear abdomen with gravid abdomen but nontender to palpation.  Vital sign overall reassuring.  Appreciate consultation from on-call neurologist who recommend head CT scan along with brain MRI and MRV for further assessment.  Patient signed out to oncoming provider who will follow-up on the imaging results and determine disposition.  I have requested rapid OB nurse to evaluate fetus.        Final Clinical Impression(s) / ED Diagnoses Final diagnoses:  TIA (transient ischemic attack)  [redacted] weeks gestation of pregnancy    Rx / DC Orders ED Discharge Orders     None         Debbra Fairy, PA-C 07/03/23 1534    Auston Blush, MD 07/03/23 [phone]

## 2023-07-03 NOTE — ED Provider Notes (Signed)
  Assume Care - Medical Decision Making  Care of patient assumed from previous emergency medicine provider. See their note for further details of history, physical exam and plan.  Briefly, Taylor Buck is a 33 y.o. female who presents with:  PMHx anxiety, depression, PCOS, bipolar who is currently [redacted] weeks pregnant presenting with strokelike symptoms.  Patient's last known normal was less than 4 hours ago.  She was at the grocery store shopping when she experienced vision changes in which she described spots in her vision and blurry vision.  She also experiencing tingling sensation to her face.  She decided to drive home when her vision became worse and she had a transient 2 minutes vision loss and her symptoms resolved.  She then noticed some throbbing frontal headache.  She also complaining of tingling sensation to her lips and her left arm and feet worse concerns here.  Once EMS brought patient to the ER, her symptom has mostly resolved.   Plan at time of Handoff: -f/u MRI  I personally reassessed the patient: NAD< no reoccurrence of symptoms  Vitals:   07/03/23 1302 07/03/23 1702  BP: (!) 104/59 107/66  Pulse: 69 64  Resp: 20 19  Temp: 98.6 F (37 C) 98.2 F (36.8 C)  SpO2: 96% 97%   Physical Exam  BP (!) 135/91   Pulse 87   Temp 98.1 F (36.7 C) (Oral)   Resp 16   SpO2 100%     Additional MDM/ED Course: MRIs unremarkable.  Discussed patient may be having complex migraines.  Was referred to neurology.  Return precautions provided.  Patient voiced understanding agrees to plan  Angele Keller, DO PGY-3 Emergency Medicine    Angele Keller, DO 07/03/23 2219    Albertus Hughs, DO 07/03/23 2256

## 2023-07-10 ENCOUNTER — Encounter: Payer: Self-pay | Admitting: Emergency Medicine

## 2023-07-10 ENCOUNTER — Other Ambulatory Visit: Payer: Self-pay

## 2023-07-10 ENCOUNTER — Ambulatory Visit: Admission: EM | Admit: 2023-07-10 | Discharge: 2023-07-10 | Disposition: A | Discharge status: Home / Self Care

## 2023-07-10 DIAGNOSIS — R002 Palpitations: Secondary | ICD-10-CM | POA: Insufficient documentation

## 2023-07-10 DIAGNOSIS — O99352 Diseases of the nervous system complicating pregnancy, second trimester: Secondary | ICD-10-CM | POA: Insufficient documentation

## 2023-07-10 DIAGNOSIS — Z3A22 22 weeks gestation of pregnancy: Secondary | ICD-10-CM | POA: Insufficient documentation

## 2023-07-10 DIAGNOSIS — Z8669 Personal history of other diseases of the nervous system and sense organs: Secondary | ICD-10-CM | POA: Insufficient documentation

## 2023-07-10 DIAGNOSIS — M722 Plantar fascial fibromatosis: Secondary | ICD-10-CM | POA: Insufficient documentation

## 2023-07-10 NOTE — ED Provider Notes (Signed)
 [facility]  Note:  This document was prepared using Conservation officer, historic buildings and may include unintentional dictation errors.  MRN: [id number] DOB: [birth year]  Subjective:   Taylor Buck is a 33 y.o. female presenting for palpitations and dyspnea that occurred last night around 10 PM.  Patient reports that she was laying down before falling asleep when she began feeling short of breath and states that she used her mother's pulse oximeter which said that her pulse ox was 88 to 93%.  Patient reports that she stood up and moved around and states that she felt a little bit better.  Patient then went to sleep and states that this morning she was not feeling as short of breath and not having palpitations but still did not feel herself.  Patient reports she has had a couple bouts of palpitations since waking up this morning.  Patient states that currently she feels much better than she did last night but is still not feeling 100% herself.  Patient is [redacted] weeks pregnant.  Patient denies any chest pain, shortness of breath, weakness, dizziness at this time.  No current facility-administered medications for this encounter.  Current Outpatient Medications:    acetaminophen  (TYLENOL ) 325 MG tablet, Take 650 mg by mouth daily as needed for pain., Disp: , Rfl:    albuterol  (VENTOLIN  HFA) 108 (90 Base) MCG/ACT inhaler, Inhale 2 puffs into the lungs every 6 (six) hours as needed for wheezing or shortness of breath., Disp: , Rfl:    ARIPiprazole  (ABILIFY ) 10 MG tablet, Take 1 tablet (10 mg total) by mouth daily., Disp: 30 tablet, Rfl: 11   Blood Pressure Monitoring (BLOOD PRESSURE KIT) DEVI, 1 Device by Does not apply route once a week., Disp: 1 each, Rfl: 0   calcium  carbonate (TUMS EX) 750 MG chewable tablet, Chew 1 tablet by mouth at bedtime as needed (for nausea)., Disp: , Rfl:    enoxaparin  (LOVENOX ) 120 MG/0.8ML injection, Inject 0.8 mLs (120 mg total) into the skin every 12  (twelve) hours., Disp: 329.6 mL, Rfl: 0   mometasone-formoterol (DULERA) 100-5 MCG/ACT AERO, Inhale 2 puffs into the lungs daily., Disp: , Rfl:    Multiple Vitamins-Minerals (MULTIVITAMIN WITH MINERALS) tablet, Take 1 tablet by mouth daily., Disp: , Rfl:    ondansetron  (ZOFRAN -ODT) 4 MG disintegrating tablet, Take 1 tablet (4 mg total) by mouth every 6 (six) hours as needed for nausea., Disp: 20 tablet, Rfl: 0   pantoprazole  (PROTONIX ) 20 MG tablet, Take 1 tablet (20 mg total) by mouth daily., Disp: 30 tablet, Rfl: 1   Prenatal Vit-Fe Phos-FA-Omega (VITAFOL  GUMMIES) 3.33-0.333-34.8 MG CHEW, CHEW 1 TABLET BY MOUTH EVERY DAY, Disp: 90 tablet, Rfl: 5   progesterone  (PROMETRIUM ) 200 MG capsule, Place one capsule vaginally at bedtime, Disp: 30 capsule, Rfl: 3   sucralfate  (CARAFATE ) 1 GM/10ML suspension, Take 10 mLs (1 g total) by mouth 4 (four) times daily -  with meals and at bedtime., Disp: 420 mL, Rfl: 0   propranolol (INDERAL) 10 MG tablet, Take 10 mg by mouth. (Patient not taking: Reported on 07/10/2023), Disp: , Rfl:    Allergies  Allergen Reactions   Milk (Cow) Anaphylaxis   Milk-Related Compounds Anaphylaxis   Penicillins Hives, Anaphylaxis and Dermatitis    Has patient had a PCN reaction causing immediate rash, facial/tongue/throat swelling, SOB or lightheadedness with hypotension: Yes  Has patient had a PCN reaction causing severe rash involving mucus membranes or skin necrosis: No  Has patient had a PCN reaction  that required hospitalization: No  Has patient had a PCN reaction occurring within the last 10 years: No  If all of the above answers are NO, then may proceed with Cephalosporin use.  Has patient had a PCN reaction causing immediate rash, facial/tongue/throat swelling, SOB or lightheadedness with hypotension: Yes Has patient had a PCN reaction causing severe rash involving mucus membranes or skin necrosis: No Has patient had a PCN reaction that required hospitalization: No  Has patient had a PCN reaction occurring within the last 10 years: No If all of the above answers are NO, then may proceed with Cephalosporin use.   Latex Hives and Dermatitis   Lorazepam Other (See Comments)    PANIC   Metoclopramide  Other (See Comments)    Panic attack   Promethazine      Other Reaction(s): Fever  Panic attack  Other Reaction(s): Fever    Panic attack   Promethazine  Hcl     Other Reaction(s): Fever  Panic attack   Phenergan  [Promethazine  Hcl]     Panic attack    Past Medical History:  Diagnosis Date   Anxiety    Arthritis    Asthma    Depression    Heart murmur    Infection    chlamydia age 71   PCOS (polycystic ovarian syndrome)    Sleep apnea    Urinary tract infection      Past Surgical History:  Procedure Laterality Date   CHOLECYSTECTOMY     HAND SURGERY     reconstruction- injury due to frostbite as child   HERNIA REPAIR     rt inguinal    Family History  Problem Relation Age of Onset   COPD Mother    Diabetes Maternal Aunt    Cancer Maternal Aunt        breast   Diabetes Maternal Grandmother    Cancer Maternal Grandmother        skin cancer   Alcoholism Other    Arthritis Other    Asthma Other    Depression Other    Hearing loss Neg Hx     Social History   Tobacco Use   Smoking status: Former    Current packs/day: 0.00    Types: Cigarettes    Quit date: 10/2022    Years since quitting: 0.6    Passive exposure: Current   Smokeless tobacco: Never   Tobacco comments:    1 PACK A DAY - 4 CIGARETTES A DAY   Vaping Use   Vaping status: Never Used  Substance Use Topics   Alcohol use: No   Drug use: Not Currently    Types: Marijuana    Comment: UP UNTIL +UPT    ROS Refer to HPI for ROS details.  Objective:   Vitals: BP 114/76 (BP Location: Right Arm)   Pulse (!) 110   Temp 98.3 F (36.8 C) (Oral)   Resp 18   LMP 02/06/2023 (Approximate)   SpO2 97%   Physical Exam Vitals and nursing note reviewed.   Constitutional:      General: She is not in acute distress.    Appearance: She is well-developed. She is not ill-appearing or toxic-appearing.  HENT:     Head: Normocephalic and atraumatic.   Cardiovascular:     Rate and Rhythm: Normal rate and regular rhythm.     Heart sounds: Normal heart sounds. No murmur heard. Pulmonary:     Effort: Pulmonary effort is normal. No respiratory distress.     Breath  sounds: Normal breath sounds. No stridor. No wheezing, rhonchi or rales.  Chest:     Chest wall: No tenderness.   Skin:    General: Skin is warm and dry.   Neurological:     General: No focal deficit present.     Mental Status: She is alert and oriented to person, place, and time.   Psychiatric:        Mood and Affect: Mood normal.        Behavior: Behavior normal.     Procedures  No results found for this or any previous visit (from the past 24 hours).  No results found.   Assessment and Plan :     Discharge Instructions       1. Palpitations (Primary) - ED EKG performed in UC shows ventricular rate of 98 bpm, normal sinus rhythm, no sign of STEMI. - Lungs clear bilaterally to auscultation, no concern for pneumonia or bronchitis.  No chest x-ray completed in UC due to patient being [redacted] weeks pregnant and risk of fetal harm. - Recommend follow-up with OB/GYN for further evaluation of hypoxia and palpitations during pregnancy.  2. History of sleep apnea - Recommend follow-up with sleep medicine for further evaluation and management of ongoing sleep apnea which may be significantly contributing to palpitations and dyspnea. -Continue to monitor symptoms for any change in severity if there is any escalation of current symptoms or development of new symptoms follow-up in ER for further evaluation and management.       Keysean Savino B Filemon Breton   Will Heinkel, Gray B, [place] 07/10/23 [phone]

## 2023-07-10 NOTE — Discharge Instructions (Addendum)
  1. Palpitations (Primary) - ED EKG performed in UC shows ventricular rate of 98 bpm, normal sinus rhythm, no sign of STEMI. - Lungs clear bilaterally to auscultation, no concern for pneumonia or bronchitis.  No chest x-ray completed in UC due to patient being [redacted] weeks pregnant and risk of fetal harm. - Recommend follow-up with OB/GYN for further evaluation of hypoxia and palpitations during pregnancy.  2. History of sleep apnea - Recommend follow-up with sleep medicine for further evaluation and management of ongoing sleep apnea which may be significantly contributing to palpitations and dyspnea. -Continue to monitor symptoms for any change in severity if there is any escalation of current symptoms or development of new symptoms follow-up in ER for further evaluation and management.

## 2023-07-10 NOTE — ED Triage Notes (Signed)
 Pt presents with recent episodes of chest tightness and shortness of breath that started at 10pm last night. Pt is not currently in distress. No trouble breathing or chest pain. States last night she was having trouble with deep breaths and states pulse oximetry was showing 88%. Notes this morning her chest felt tight and heart was racing in 140s. Vitals are stable in triage. Pt is currently [redacted] weeks pregnant.

## 2023-07-16 ENCOUNTER — Ambulatory Visit: Attending: Obstetrics and Gynecology

## 2023-07-16 DIAGNOSIS — Z141 Cystic fibrosis carrier: Secondary | ICD-10-CM | POA: Diagnosis not present

## 2023-07-16 NOTE — Progress Notes (Signed)
 [facility] at [facility] [address] Phone:  [phone]   Fax:  [phone]      In-Person Genetic Counseling Clinic Note:   I spoke with 33 y.o. Taylor Buck today to discuss fetal risk for cystic fibrosis. She was accompanied by FOB Justus.  This is a follow-up to the genetic counseling visit on 06/26/2023. At that visit, we discussed [name]'s carrier screening results and offered FOB carrier screening. Today, we reviewed Justus's positive results and next steps.   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 We discussed she does not have CF; however, some carriers may have mild symptoms. Please see report for details.  Paternal carrier for CF: FOB Duncan Gibson is also a carrier for CF, as he is positive for the likely pathogenic variant c.489+3A>G in one of his CFTR genes. We reviewed that this variant has variable expressivity, with some individuals being affected with classic CF symptoms while others not exhibiting CF symptoms. Please see report for details.   We reviewed the CFTR gene, autosomal recessive inheritance, cystic fibrosis, and its natural history. Since both are carriers for CF, there is a 25% chance to have an affected child.  Fetal testing offered: We discussed genetic testing for the fetus, including prenatal diagnosis through amniocentesis, [facility] single gene NIPS, and postnatal testing. The benefits, risks, and limitations of each were reviewed. We reviewed that the [facility]  Newborn Screening (NBS) program will screen all newborn babies for cystic fibrosis, spinal muscular atrophy, hemoglobinopathies, and numerous other conditions. The newborn screening test utilizes trypsinogen levels rather than  genetic testing to detect affected individuals whereas genetic testing can determine the specific variants, if any, that a child may have inherited. Therefore, the couple may also seek postnatal genetic counseling and testing if needed or desired. We also reviewed Early Check screening.   She declined amniocentesis but elected [facility] single gene NIPS with fetal risk assessment for CF. We reviewed that this is just a screen and cannot definitively diagnosis or rule out CF in the fetus. We reviewed that it can be difficult to predict the severity prenatally.   Cystic fibrosis: We discussed that mucus lubricates and protects the linings of the airways, digestive system, reproductive system, and other organs and tissues. When functioning correctly, the CFTR gene provides instructions for a channel that transports chloride ions into and out of cells. The flow of chloride ions helps control the movement of water in the body's tissues, which is necessary for the production of thin, freely flowing mucus. Pathogenic variants in the CFTR gene disrupt the function of the chloride channels, preventing them from regulating the flow of chloride ions and water across cell membranes. Individuals affected with CF therefore have abnormally thick, sticky mucus that cannot easily be cleared from the airways and digestive system, leading to progressive damage to the respiratory system and chronic digestive system problems. We discussed that common features of CF include respiratory difficulties, bacterial infections in the lungs, the formation of scar tissue (fibrosis) and cysts in the lungs, pancreatic insufficiency, CF-related diabetes mellitus, diarrhea, malnutrition, poor growth, and weight loss. Most men with CF have congenital bilateral absence of the vas deferens (CBAVD) which causes female infertility. With therapies, such as daily respiratory therapies and medications  to aid digestion, the median lifespan for people with CF is  now in the 40's to 50's. Treatment may involve lung transplantation and CFTR protein modulators in some cases. CF is variably expressed, meaning features of the condition and their severity vary among affected individuals. Expression and severity of CF depends upon the specific mutations present in an affected individual.  Resources: Cystic Fibrosis Foundation [employer]   Plan of Care:   Patient declined amniocentesis. [facility] single gene NIPS with fetal risk assessment for CF was drawn today. NBS for the newborn for CF. Postnatal referral to pediatric genetics as clinically indicated.   Informed consent was obtained. All questions were answered.   80 minutes were spent on the date of the encounter in service to the patient including preparation, face-to-face consultation, discussion of test reports and available next steps, pedigree construction, genetic risk assessment, documentation, and care coordination.    Thank you for sharing in the care of Taylor Buck with us .  Please do not hesitate to contact us  at [phone] if you have any questions.   Georgean Kindle, MS, [facility] Certified Genetic Counselor   Genetic counseling student involved in appointment: No.

## 2023-07-18 ENCOUNTER — Telehealth: Payer: Self-pay | Admitting: *Deleted

## 2023-07-18 NOTE — Telephone Encounter (Signed)
 Informed patient that Dr. Scherrie Curt wanted her on the 120 mg q12 dose of Lovenox  for 3 full months, then will change to daily dosing. Started on 5/4, so call office around 09/01/23 to make the change. She understands and agrees.

## 2023-07-18 NOTE — Telephone Encounter (Signed)
 Reports as of 6/19, she is 6 months pregnant. Asking when she can change to daily dosing on Lovenox  (currently 120 mg q12 hr)?

## 2023-07-24 ENCOUNTER — Other Ambulatory Visit: Payer: Self-pay | Admitting: *Deleted

## 2023-07-24 ENCOUNTER — Ambulatory Visit: Attending: Obstetrics and Gynecology

## 2023-07-24 ENCOUNTER — Encounter: Payer: Self-pay | Admitting: Obstetrics and Gynecology

## 2023-07-24 ENCOUNTER — Ambulatory Visit: Admitting: Obstetrics and Gynecology

## 2023-07-24 VITALS — BP 106/55 | HR 77

## 2023-07-24 VITALS — BP 113/66 | HR 80 | Wt 261.0 lb

## 2023-07-24 DIAGNOSIS — Z3A24 24 weeks gestation of pregnancy: Secondary | ICD-10-CM | POA: Insufficient documentation

## 2023-07-24 DIAGNOSIS — Z8751 Personal history of pre-term labor: Secondary | ICD-10-CM

## 2023-07-24 DIAGNOSIS — O0990 Supervision of high risk pregnancy, unspecified, unspecified trimester: Secondary | ICD-10-CM | POA: Insufficient documentation

## 2023-07-24 DIAGNOSIS — O09292 Supervision of pregnancy with other poor reproductive or obstetric history, second trimester: Secondary | ICD-10-CM | POA: Diagnosis not present

## 2023-07-24 DIAGNOSIS — Z6841 Body Mass Index (BMI) 40.0 and over, adult: Secondary | ICD-10-CM | POA: Diagnosis present

## 2023-07-24 DIAGNOSIS — J4520 Mild intermittent asthma, uncomplicated: Secondary | ICD-10-CM

## 2023-07-24 DIAGNOSIS — I82612 Acute embolism and thrombosis of superficial veins of left upper extremity: Secondary | ICD-10-CM

## 2023-07-24 DIAGNOSIS — O99352 Diseases of the nervous system complicating pregnancy, second trimester: Secondary | ICD-10-CM

## 2023-07-24 DIAGNOSIS — Z148 Genetic carrier of other disease: Secondary | ICD-10-CM | POA: Diagnosis present

## 2023-07-24 DIAGNOSIS — G459 Transient cerebral ischemic attack, unspecified: Secondary | ICD-10-CM | POA: Diagnosis present

## 2023-07-24 DIAGNOSIS — O99212 Obesity complicating pregnancy, second trimester: Secondary | ICD-10-CM

## 2023-07-24 DIAGNOSIS — O09212 Supervision of pregnancy with history of pre-term labor, second trimester: Secondary | ICD-10-CM | POA: Diagnosis not present

## 2023-07-24 DIAGNOSIS — F319 Bipolar disorder, unspecified: Secondary | ICD-10-CM

## 2023-07-24 NOTE — Progress Notes (Signed)
 Maternal-Fetal Medicine Consultation Name: Taylor Buck MRN: [id number]  G5 [id number] at 24w gestation.  Patient is here for completion of fetal anatomy.  Her high risk problems include: - History of TIA and upper extremity thrombosis (left cephalic and basilic vein).  Patient takes Lovenox  120 mg twice daily. - History of preterm delivery.  Patient takes vaginal progesterone . - Cystic fibrosis carrier.  Her partner had screening and is also a carrier of a different pathogenic variant. [facility] called it variant of uncertain significance.  [facility] could not report the single gene result for CF on cell free fetal DNA screening. Patient met with our genetic counselor and had opted not to have amniocentesis.  Ultrasound Fetal growth is appropriate for gestational age.  Amniotic fluid normal good fetal activity seen.  Fetal anatomical survey was completed and appears normal.  Spine views are limited. Bowel appears normal with no evidence of hyperechogenicity.  Gallbladder is seen and appears normal.  Cystic fibrosis carrier Patient had questions about screening and amniocentesis.  I counseled the patient that we would like to resume that the fetus has 25% chance of having cystic fibrosis.  Severity cannot be determined before delivery.  I explained amniocentesis procedure and possible complication of preterm delivery (1 and 500 procedures).  Patient is taking therapeutic Lovenox .  If she decides to have amniocentesis, she should withhold the bedtime dose and have amniocentesis on the following morning before taking the morning dose. She informed that she will not terminate the pregnancy regardless of fetal diagnosis.  I discussed the option of delaying amniocentesis to 93- or 32-weeks' gestation if termination is not an option. Patient will discuss with her husband and communicate her decision to a Dentist.  She had fetal echocardiography that was reported as  normal.  Recommendations - An appointment was made for her to return in 6 weeks for fetal growth assessment, and to check for signs of cystic fibrosis (bowel and gallbladder.   Consultation including face-to-face (more than 50%) counseling 20 minutes.

## 2023-07-24 NOTE — Progress Notes (Signed)
 Pt presents for ROB visit. No concerns

## 2023-07-24 NOTE — Progress Notes (Addendum)
 PRENATAL VISIT NOTE  Subjective:  Taylor Buck is a 33 y.o. [id number] at [redacted]w[redacted]d being seen today for ongoing prenatal care.  She is currently monitored for the following issues for this high-risk pregnancy and has History of preterm delivery; Bipolar 1 disorder (HCC); History of substance use disorder; Trichomonas vaginitis; Asthma; Family history of breast cancer; Nicotine  dependence, cigarettes, uncomplicated; Obstructive sleep apnea; PCOS (polycystic ovarian syndrome); Posttraumatic stress disorder; Supervision of high risk pregnancy, antepartum; Cystic fibrosis carrier; Pathological nipple discharge; BMI 40.0-44.9, adult (HCC); Abnormal glucose tolerance test; TIA (transient ischemic attack); Anxiety disorder affecting pregnancy, antepartum; Superficial venous thrombosis of arm, left; and Plantar fasciitis, bilateral on their problem list.  Patient reports knot where prior lovenox  injection was and general bruising from shots.  Contractions: Not present. Vag. Bleeding: None.  Movement: Present. Denies leaking of fluid.   The following portions of the patient's history were reviewed and updated as appropriate: allergies, current medications, past family history, past medical history, past social history, past surgical history and problem list.   Objective:    Vitals:   07/24/23 0836  BP: 113/66  Pulse: 80  Weight: 261 lb (118.4 kg)    Fetal Status:  Fetal Heart Rate (bpm): 148 Fundal Height: 24 cm Movement: Present    General: Alert, oriented and cooperative. Patient is in no acute distress.  Skin: Skin is warm and dry. No rash noted.   Cardiovascular: Normal heart rate noted  Respiratory: Normal respiratory effort, no problems with respiration noted  Abdomen: Soft, gravid, appropriate for gestational age. Diffuse bruising on flanks. No erythema, fluctuance.  Pain/Pressure: Absent     Pelvic: Cervical exam deferred        Extremities: Normal range of motion.  Edema:  Trace  Mental Status: Normal mood and affect. Normal behavior. Normal judgment and thought content.   Assessment and Plan:  Pregnancy: [id number] at [redacted]w[redacted]d  1. Supervision of high risk pregnancy, antepartum (Primary) Discussed need for repeat GTT and 28 week labs at next visit. Feeling regular FM.  2. TIA (transient ischemic attack) Hx of in early pregnancy. Continues on lovenox  and will need treatment through 6 weeks PP. Has bruising and beginning of knots on sides of her abdomen from injections. Encouraged to rotate spots and use her thighs if needed. Seeing MFM and hematology.  3. Superficial venous thrombosis of arm, left See #2.  4. History of preterm delivery Normal cervical length and no signs of preterm labor at this time.  5. Bipolar 1 disorder (HCC) Doing well on Abilify .  6. Mild intermittent asthma without complication Doing well on dulera/PRN albuterol .   Preterm labor symptoms and general obstetric precautions including but not limited to vaginal bleeding, contractions, leaking of fluid and fetal movement were reviewed in detail with the patient. Please refer to After Visit Summary for other counseling recommendations.   Return in about 4 weeks (around 08/21/2023) for ROB/GTT.  Future Appointments  Date Time Provider Department Center  07/24/2023  1:00 PM [facility] PROVIDER 1 [facility]  07/24/2023  1:30 PM [facility] US2 [facility]  08/04/2023  9:30 AM Luke Orlan HERO, DO [facility] None  08/21/2023  8:00 AM [facility] PROVIDER 1 [facility]  08/21/2023  8:30 AM [facility] US4 [facility]  08/25/2023  9:15 AM [facility] LAB [facility] None  08/25/2023 10:55 AM Nicholaus Jorene BRAVO, PA-C [facility] None  12/30/2023 10:00 AM [facility] PHLEBOTOMIST [facility] None  12/30/2023 10:20 AM Cloretta Arley NOVAK, MD [facility] None    Almarie HERO Nicholaus,  MD

## 2023-07-26 ENCOUNTER — Encounter (HOSPITAL_COMMUNITY): Payer: Self-pay | Admitting: Obstetrics & Gynecology

## 2023-07-26 ENCOUNTER — Inpatient Hospital Stay (HOSPITAL_COMMUNITY)
Admission: AD | Admit: 2023-07-26 | Discharge: 2023-07-26 | Disposition: A | Discharge status: Home / Self Care | Attending: Obstetrics & Gynecology | Admitting: Obstetrics & Gynecology

## 2023-07-26 DIAGNOSIS — Z7901 Long term (current) use of anticoagulants: Secondary | ICD-10-CM | POA: Diagnosis not present

## 2023-07-26 DIAGNOSIS — M545 Low back pain, unspecified: Secondary | ICD-10-CM | POA: Diagnosis not present

## 2023-07-26 DIAGNOSIS — O99352 Diseases of the nervous system complicating pregnancy, second trimester: Secondary | ICD-10-CM | POA: Diagnosis present

## 2023-07-26 DIAGNOSIS — O26892 Other specified pregnancy related conditions, second trimester: Secondary | ICD-10-CM | POA: Insufficient documentation

## 2023-07-26 DIAGNOSIS — Z87891 Personal history of nicotine dependence: Secondary | ICD-10-CM | POA: Diagnosis not present

## 2023-07-26 DIAGNOSIS — R1030 Lower abdominal pain, unspecified: Secondary | ICD-10-CM | POA: Diagnosis not present

## 2023-07-26 DIAGNOSIS — O09212 Supervision of pregnancy with history of pre-term labor, second trimester: Secondary | ICD-10-CM | POA: Insufficient documentation

## 2023-07-26 DIAGNOSIS — O99212 Obesity complicating pregnancy, second trimester: Secondary | ICD-10-CM | POA: Insufficient documentation

## 2023-07-26 DIAGNOSIS — Z8673 Personal history of transient ischemic attack (TIA), and cerebral infarction without residual deficits: Secondary | ICD-10-CM | POA: Insufficient documentation

## 2023-07-26 DIAGNOSIS — O479 False labor, unspecified: Secondary | ICD-10-CM

## 2023-07-26 DIAGNOSIS — Z3A24 24 weeks gestation of pregnancy: Secondary | ICD-10-CM

## 2023-07-26 LAB — URINALYSIS, ROUTINE W REFLEX MICROSCOPIC
Bilirubin Urine: NEGATIVE
Glucose, UA: NEGATIVE mg/dL
Hgb urine dipstick: NEGATIVE
Ketones, ur: NEGATIVE mg/dL
Leukocytes,Ua: NEGATIVE
Nitrite: NEGATIVE
Protein, ur: NEGATIVE mg/dL
Specific Gravity, Urine: 1.011 (ref 1.005–1.030)
pH: 7 (ref 5.0–8.0)

## 2023-07-26 LAB — FETAL FIBRONECTIN: Fetal Fibronectin: NEGATIVE

## 2023-07-26 LAB — WET PREP, GENITAL
Clue Cells Wet Prep HPF POC: NONE SEEN
Sperm: NONE SEEN
Trich, Wet Prep: NONE SEEN
WBC, Wet Prep HPF POC: >=10 — AB (ref <10)
Yeast Wet Prep HPF POC: NONE SEEN

## 2023-07-26 MED ORDER — ONDANSETRON 4 MG PO TBDP
8.0000 mg | ORAL_TABLET | Freq: Once | ORAL | Status: AC
  Administered 2023-07-26: 8 mg via ORAL
  Filled 2023-07-26: qty 2

## 2023-07-26 MED ORDER — CYCLOBENZAPRINE HCL 5 MG PO TABS
10.0000 mg | ORAL_TABLET | Freq: Once | ORAL | Status: AC
  Administered 2023-07-26: 10 mg via ORAL
  Filled 2023-07-26: qty 2

## 2023-07-26 MED ORDER — ACETAMINOPHEN 500 MG PO TABS
1000.0000 mg | ORAL_TABLET | Freq: Once | ORAL | Status: AC
Start: 2023-07-26 — End: 2023-07-26
  Administered 2023-07-26: 1000 mg via ORAL
  Filled 2023-07-26: qty 2

## 2023-07-26 NOTE — MAU Note (Signed)
 Taylor Buck is a 33 y.o. at [redacted]w[redacted]d here in MAU reporting: been feeling cramping/contractions q 5 min for the past 2 hours. Denies any vag bleeding or leaking and reports good fetal movement.   LMP:  Onset of complaint: 2 hours Pain score: 3 Vitals:   07/26/23 1925  BP: 113/60  Pulse: 64  Resp: 18  Temp: 98.4 F (36.9 C)     FHT: 145   Lab orders placed from triage: u/a

## 2023-07-26 NOTE — MAU Provider Note (Addendum)
 Chief Complaint:  Contractions   HPI   Event Date/Time   First Provider Initiated Contact with Patient 07/26/23 2010      Taylor Buck is a 33 y.o. [id number] at [redacted]w[redacted]d who presents to maternity admissions reporting lower abdominal cramping and contractions approximately q 5-7 minutes x 3 hours with lower back pain. She denies any VB, LOF,, and reports fetal movements are present.  Pregnancy complicated by Obesity, - History of TIA and upper extremity thrombosis (left cephalic and basilic vein).  Patient takes Lovenox  120 mg twice daily.  - History of preterm delivery.  Patient takes vaginal progesterone .  - Cystic fibrosis carrier.  Her partner had screening and is also a carrier of a different pathogenic variant. [facility] called it variant of uncertain significance.  [facility] could not report the single gene result for CF on cell free fetal DNA screening.  Patient met with our genetic counselor and had opted not to have amniocentesis.  Pregnancy Course: [facility]   Past Medical History:  Diagnosis Date   Anxiety    Arthritis    Asthma    Depression    Heart murmur    Infection    chlamydia age 80   PCOS (polycystic ovarian syndrome)    Sleep apnea    TIA (transient ischemic attack) 2025   Urinary tract infection    OB History  Gravida Para Term Preterm AB Living  5 2 1 1 2 2   SAB IAB Ectopic Multiple Live Births  2 0 0 0 2    # Outcome Date GA Lbr Len/2nd Weight Sex Type Anes PTL Lv  5 Current           4 Term 05/27/18 [redacted]w[redacted]d 10:28 / 02:40 3100 g F Vag-Spont EPI  LIV  3 SAB 04/2017          2 Preterm 07/31/12 [redacted]w[redacted]d 07:00  M Vag-Spont None Y LIV  1 SAB 2008     SAB      Past Surgical History:  Procedure Laterality Date   CHOLECYSTECTOMY     HAND SURGERY     reconstruction- injury due to frostbite as child   HERNIA REPAIR     rt inguinal   Family History  Problem Relation Age of Onset   COPD Mother    Diabetes Maternal Aunt    Cancer Maternal Aunt         breast   Diabetes Maternal Grandmother    Cancer Maternal Grandmother        skin cancer   Alcoholism Other    Arthritis Other    Asthma Other    Depression Other    Hearing loss Neg Hx    Social History   Tobacco Use   Smoking status: Former    Current packs/day: 0.00    Types: Cigarettes    Quit date: 10/2022    Years since quitting: 0.7    Passive exposure: Current   Smokeless tobacco: Never   Tobacco comments:    1 PACK A DAY - 4 CIGARETTES A DAY   Vaping Use   Vaping status: Never Used  Substance Use Topics   Alcohol use: No   Drug use: Not Currently    Types: Marijuana    Comment: UP UNTIL +UPT   Allergies  Allergen Reactions   Milk (Cow) Anaphylaxis   Milk-Related Compounds Anaphylaxis   Penicillins Hives, Anaphylaxis and Dermatitis    Has patient had a PCN reaction causing immediate rash, facial/tongue/throat  swelling, SOB or lightheadedness with hypotension: Yes  Has patient had a PCN reaction causing severe rash involving mucus membranes or skin necrosis: No  Has patient had a PCN reaction that required hospitalization: No  Has patient had a PCN reaction occurring within the last 10 years: No  If all of the above answers are NO, then may proceed with Cephalosporin use.  Has patient had a PCN reaction causing immediate rash, facial/tongue/throat swelling, SOB or lightheadedness with hypotension: Yes Has patient had a PCN reaction causing severe rash involving mucus membranes or skin necrosis: No Has patient had a PCN reaction that required hospitalization: No Has patient had a PCN reaction occurring within the last 10 years: No If all of the above answers are NO, then may proceed with Cephalosporin use.   Latex Hives and Dermatitis   Lorazepam Other (See Comments)    PANIC   Metoclopramide  Other (See Comments)    Panic attack   Promethazine      Other Reaction(s): Fever  Panic attack  Other Reaction(s): Fever    Panic attack   Promethazine   Hcl     Other Reaction(s): Fever  Panic attack   Phenergan  [Promethazine  Hcl]     Panic attack   Medications Prior to Admission  Medication Sig Dispense Refill Last Dose/Taking   acetaminophen  (TYLENOL ) 325 MG tablet Take 650 mg by mouth daily as needed for pain.      albuterol  (VENTOLIN  HFA) 108 (90 Base) MCG/ACT inhaler Inhale 2 puffs into the lungs every 6 (six) hours as needed for wheezing or shortness of breath.      ARIPiprazole  (ABILIFY ) 10 MG tablet Take 1 tablet (10 mg total) by mouth daily. 30 tablet 11    Blood Pressure Monitoring (BLOOD PRESSURE KIT) DEVI 1 Device by Does not apply route once a week. 1 each 0    calcium  carbonate (TUMS EX) 750 MG chewable tablet Chew 1 tablet by mouth at bedtime as needed (for nausea).      enoxaparin  (LOVENOX ) 120 MG/0.8ML injection Inject 0.8 mLs (120 mg total) into the skin every 12 (twelve) hours. 329.6 mL 0    mometasone-formoterol (DULERA) 100-5 MCG/ACT AERO Inhale 2 puffs into the lungs daily.      Multiple Vitamins-Minerals (MULTIVITAMIN WITH MINERALS) tablet Take 1 tablet by mouth daily.      ondansetron  (ZOFRAN -ODT) 4 MG disintegrating tablet Take 1 tablet (4 mg total) by mouth every 6 (six) hours as needed for nausea. 20 tablet 0    pantoprazole  (PROTONIX ) 20 MG tablet Take 1 tablet (20 mg total) by mouth daily. 30 tablet 1    Prenatal Vit-Fe Phos-FA-Omega (VITAFOL  GUMMIES) 3.33-0.333-34.8 MG CHEW CHEW 1 TABLET BY MOUTH EVERY DAY 90 tablet 5    progesterone  (PROMETRIUM ) 200 MG capsule Place one capsule vaginally at bedtime (Patient not taking: Reported on 07/24/2023) 30 capsule 3    propranolol (INDERAL) 10 MG tablet Take 10 mg by mouth. (Patient not taking: Reported on 07/24/2023)      sucralfate  (CARAFATE ) 1 GM/10ML suspension Take 10 mLs (1 g total) by mouth 4 (four) times daily -  with meals and at bedtime. 420 mL 0     I have reviewed patient's Past Medical Hx, Surgical Hx, Family Hx, Social Hx, medications and allergies.   ROS   Pertinent items noted in HPI and remainder of comprehensive ROS otherwise negative.   PHYSICAL EXAM  Patient Vitals for the past 24 hrs:  BP Temp Pulse Resp  07/26/23 1925 113/60 98.4  F (36.9 C) 64 18    Constitutional: Well-developed, obese  female in no acute distress.  Cardiovascular: normal rate & rhythm, warm and well-perfused Respiratory: normal effort, no problems with respiration noted GI: Abd soft, non-tender, no guarding, no rebound, no rigidity, no reproducible pain on palpation MS: Extremities nontender, no edema, normal ROM Neurologic: Alert and oriented x 4.  GU: no CVA tenderness Pelvic: Exam chaperoned by Comer Blush, RN No pooling, scant vaginal discharge present, no evidence of vaginal bleeding and cervix visually closed      Fetal Tracing: Unable to get continuous 20 minute tracing d/t GA and increased maternal habitus  although reassuring for GA  Baseline: 140 Variability: moderate  Accelerations: absent Decelerations: absent Toco: no ctx's   Labs: Results for orders placed or performed during the hospital encounter of 07/26/23 (from the past 24 hours)  Urinalysis, Routine w reflex microscopic -Urine, Clean Catch     Status: Abnormal   Collection Time: 07/26/23  7:02 PM  Result Value Ref Range   Color, Urine YELLOW YELLOW   APPearance HAZY (A) CLEAR   Specific Gravity, Urine 1.011 1.005 - 1.030   pH 7.0 5.0 - 8.0   Glucose, UA NEGATIVE NEGATIVE mg/dL   Hgb urine dipstick NEGATIVE NEGATIVE   Bilirubin Urine NEGATIVE NEGATIVE   Ketones, ur NEGATIVE NEGATIVE mg/dL   Protein, ur NEGATIVE NEGATIVE mg/dL   Nitrite NEGATIVE NEGATIVE   Leukocytes,Ua NEGATIVE NEGATIVE    Imaging:  No results found.  MDM & MAU COURSE  MDM:  HIGH  R/O PTL   - Prenatal records reviewed - Physical exam with pelvic - U/A: unremarkable - Vaginal cultures - FFN - NST for GA and fetal well being   MAU Course: Orders Placed This Encounter  Procedures   Wet  prep, genital   Urinalysis, Routine w reflex microscopic -Urine, Clean Catch   Fetal fibronectin   Meds ordered this encounter  Medications   ondansetron  (ZOFRAN -ODT) disintegrating tablet 8 mg   acetaminophen  (TYLENOL ) tablet 1,000 mg   cyclobenzaprine  (FLEXERIL ) tablet 10 mg    I have reviewed the patient chart and performed the physical exam . I have ordered & interpreted the lab results and reviewed and interpreted the NST Medications ordered as stated below.  A/P as described below.  Counseling and education provided and patient agreeable  with plan as described below. Verbalized understanding.    ASSESSMENT    PLAN    Patient care signed out to Dr Von at 2100 awaiting FFN results  ------------------------------------------------------------------------------------------------- Olam Dalton, MSN, [facility], Center for [facility]   9:28 PM  fFN resulted -- negative. Reassuring that patient at less than 1% chance of PTD in next two weeks. Discharged with return return precautions.  Alain Von, MD OB Fellow, Faculty Practice [facility], Center for [facility]

## 2023-07-29 LAB — GC/CHLAMYDIA PROBE AMP (~~LOC~~) NOT AT ARMC
Chlamydia: NEGATIVE
Comment: NEGATIVE
Comment: NORMAL
Neisseria Gonorrhea: NEGATIVE

## 2023-08-04 ENCOUNTER — Ambulatory Visit: Payer: Self-pay | Admitting: Allergy

## 2023-08-13 ENCOUNTER — Encounter: Payer: Self-pay | Admitting: Physician Assistant

## 2023-08-14 ENCOUNTER — Other Ambulatory Visit: Payer: Self-pay | Admitting: Obstetrics and Gynecology

## 2023-08-14 DIAGNOSIS — G459 Transient cerebral ischemic attack, unspecified: Secondary | ICD-10-CM

## 2023-08-14 MED ORDER — ENOXAPARIN SODIUM 120 MG/0.8ML IJ SOSY: 120.0000 mg | PREFILLED_SYRINGE | Freq: Two times a day (BID) | INTRAMUSCULAR | 1 refills | Status: DC

## 2023-08-14 NOTE — Progress Notes (Signed)
 Refill Lovenox  sent.

## 2023-08-20 ENCOUNTER — Inpatient Hospital Stay (HOSPITAL_COMMUNITY)
Admission: AD | Admit: 2023-08-20 | Discharge: 2023-08-20 | Disposition: A | Discharge status: Home / Self Care | Attending: Obstetrics & Gynecology | Admitting: Obstetrics & Gynecology

## 2023-08-20 ENCOUNTER — Encounter (HOSPITAL_COMMUNITY): Payer: Self-pay | Admitting: Obstetrics & Gynecology

## 2023-08-20 DIAGNOSIS — Z148 Genetic carrier of other disease: Secondary | ICD-10-CM | POA: Diagnosis not present

## 2023-08-20 DIAGNOSIS — Z3689 Encounter for other specified antenatal screening: Secondary | ICD-10-CM | POA: Diagnosis not present

## 2023-08-20 DIAGNOSIS — Z86718 Personal history of other venous thrombosis and embolism: Secondary | ICD-10-CM | POA: Insufficient documentation

## 2023-08-20 DIAGNOSIS — O99212 Obesity complicating pregnancy, second trimester: Secondary | ICD-10-CM | POA: Diagnosis not present

## 2023-08-20 DIAGNOSIS — Z3A27 27 weeks gestation of pregnancy: Secondary | ICD-10-CM

## 2023-08-20 DIAGNOSIS — O368190 Decreased fetal movements, unspecified trimester, not applicable or unspecified: Secondary | ICD-10-CM

## 2023-08-20 DIAGNOSIS — Z8673 Personal history of transient ischemic attack (TIA), and cerebral infarction without residual deficits: Secondary | ICD-10-CM | POA: Diagnosis not present

## 2023-08-20 DIAGNOSIS — O99412 Diseases of the circulatory system complicating pregnancy, second trimester: Secondary | ICD-10-CM | POA: Diagnosis not present

## 2023-08-20 DIAGNOSIS — Z91138 Patient's unintentional underdosing of medication regimen for other reason: Secondary | ICD-10-CM | POA: Diagnosis not present

## 2023-08-20 DIAGNOSIS — O368120 Decreased fetal movements, second trimester, not applicable or unspecified: Secondary | ICD-10-CM

## 2023-08-20 MED ORDER — ENOXAPARIN SODIUM 120 MG/0.8ML IJ SOSY
120.0000 mg | PREFILLED_SYRINGE | Freq: Once | INTRAMUSCULAR | Status: AC
Start: 2023-08-20 — End: 2023-08-20
  Administered 2023-08-20: 120 mg via SUBCUTANEOUS
  Filled 2023-08-20: qty 0.8

## 2023-08-20 NOTE — MAU Note (Signed)
..  Taylor Buck is a 33 y.o. at [redacted]w[redacted]d here in MAU reporting: hasn't felt fetal movement all day today. Denies leaking fluid, ctx, bleeding, discharge.     Pain score: 2/10 back pain Vitals:   08/20/23 1135  BP: 112/66  Pulse: 89  Resp: 16  Temp: 98.6 F (37 C)     FHT:145 Lab orders placed from triage:  none

## 2023-08-20 NOTE — MAU Provider Note (Signed)
 Chief Complaint:  Decreased Fetal Movement   HPI      Taylor Buck is a 33 y.o. [id number] at [redacted]w[redacted]d who presents to maternity admissions reporting that she has not felt the baby move all day.  Pregnancy is complicated by obesity, history of TIA and upper  extremity thrombosis (left cephalic and basilic vein) she is currently on Lovenox  120 mg twice daily.  Patient states that she has had difficulty filling her Lovenox  and missed her dose's yesterday and  morning d/t an issue with the pharmacy which is now resolved.  Will order morning dose while here in our care.  Patient is also a cystic fibrosis carrier, partner is also a carrier of a different pathogenic variant. Patient declined amniocentesis although met with genetic counselor  Pregnancy Course: [facility]  Past Medical History:  Diagnosis Date   Anxiety    Arthritis    Asthma    Depression    Heart murmur    Infection    chlamydia age 20   PCOS (polycystic ovarian syndrome)    Sleep apnea    TIA (transient ischemic attack) 2025   Urinary tract infection    OB History  Gravida Para Term Preterm AB Living  5 2 1 1 2 2   SAB IAB Ectopic Multiple Live Births  2 0 0 0 2    # Outcome Date GA Lbr Len/2nd Weight Sex Type Anes PTL Lv  5 Current           4 Term 05/27/18 [redacted]w[redacted]d 10:28 / 02:40 3100 g F Vag-Spont EPI  LIV  3 SAB 04/2017          2 Preterm 07/31/12 [redacted]w[redacted]d 07:00  M Vag-Spont None Y LIV  1 SAB 2008     SAB      Past Surgical History:  Procedure Laterality Date   CHOLECYSTECTOMY     HAND SURGERY     reconstruction- injury due to frostbite as child   HERNIA REPAIR     rt inguinal   Family History  Problem Relation Age of Onset   COPD Mother    Diabetes Maternal Aunt    Cancer Maternal Aunt        breast   Diabetes Maternal Grandmother    Cancer Maternal Grandmother        skin cancer   Alcoholism Other    Arthritis Other    Asthma Other    Depression Other    Hearing loss Neg Hx    Social  History   Tobacco Use   Smoking status: Former    Current packs/day: 0.00    Types: Cigarettes    Quit date: 10/2022    Years since quitting: 0.8    Passive exposure: Current   Smokeless tobacco: Never   Tobacco comments:    1 PACK A DAY - 4 CIGARETTES A DAY   Vaping Use   Vaping status: Never Used  Substance Use Topics   Alcohol use: No   Drug use: Not Currently    Types: Marijuana    Comment: UP UNTIL +UPT   Allergies  Allergen Reactions   Milk (Cow) Anaphylaxis   Milk-Related Compounds Anaphylaxis   Penicillins Hives, Anaphylaxis and Dermatitis    Has patient had a PCN reaction causing immediate rash, facial/tongue/throat swelling, SOB or lightheadedness with hypotension: Yes  Has patient had a PCN reaction causing severe rash involving mucus membranes or skin necrosis: No  Has patient had a PCN reaction that required hospitalization:  No  Has patient had a PCN reaction occurring within the last 10 years: No  If all of the above answers are NO, then may proceed with Cephalosporin use.  Has patient had a PCN reaction causing immediate rash, facial/tongue/throat swelling, SOB or lightheadedness with hypotension: Yes Has patient had a PCN reaction causing severe rash involving mucus membranes or skin necrosis: No Has patient had a PCN reaction that required hospitalization: No Has patient had a PCN reaction occurring within the last 10 years: No If all of the above answers are NO, then may proceed with Cephalosporin use.   Latex Hives and Dermatitis   Lorazepam Other (See Comments)    PANIC   Metoclopramide  Other (See Comments)    Panic attack   Promethazine      Other Reaction(s): Fever  Panic attack  Other Reaction(s): Fever    Panic attack   Promethazine  Hcl     Other Reaction(s): Fever  Panic attack   Phenergan  [Promethazine  Hcl]     Panic attack   Medications Prior to Admission  Medication Sig Dispense Refill Last Dose/Taking   acetaminophen  (TYLENOL )  325 MG tablet Take 650 mg by mouth daily as needed for pain.   Past Month   albuterol  (VENTOLIN  HFA) 108 (90 Base) MCG/ACT inhaler Inhale 2 puffs into the lungs every 6 (six) hours as needed for wheezing or shortness of breath.   08/20/2023 Morning   ARIPiprazole  (ABILIFY ) 10 MG tablet Take 1 tablet (10 mg total) by mouth daily. 30 tablet 11 08/19/2023 Evening   calcium  carbonate (TUMS EX) 750 MG chewable tablet Chew 1 tablet by mouth at bedtime as needed (for nausea).   08/19/2023 Evening   enoxaparin  (LOVENOX ) 120 MG/0.8ML injection Inject 0.8 mLs (120 mg total) into the skin every 12 (twelve) hours. 144 mL 1 08/19/2023 Morning   mometasone-formoterol (DULERA) 100-5 MCG/ACT AERO Inhale 2 puffs into the lungs daily.   08/20/2023 Morning   Multiple Vitamins-Minerals (MULTIVITAMIN WITH MINERALS) tablet Take 1 tablet by mouth daily.   08/19/2023 Morning   ondansetron  (ZOFRAN -ODT) 4 MG disintegrating tablet Take 1 tablet (4 mg total) by mouth every 6 (six) hours as needed for nausea. 20 tablet 0 Past Month   pantoprazole  (PROTONIX ) 20 MG tablet Take 1 tablet (20 mg total) by mouth daily. 30 tablet 1 08/19/2023 Morning   Prenatal Vit-Fe Phos-FA-Omega (VITAFOL  GUMMIES) 3.33-0.333-34.8 MG CHEW CHEW 1 TABLET BY MOUTH EVERY DAY 90 tablet 5 Past Month   sucralfate  (CARAFATE ) 1 GM/10ML suspension Take 10 mLs (1 g total) by mouth 4 (four) times daily -  with meals and at bedtime. 420 mL 0 08/19/2023 Evening   Blood Pressure Monitoring (BLOOD PRESSURE KIT) DEVI 1 Device by Does not apply route once a week. 1 each 0    progesterone  (PROMETRIUM ) 200 MG capsule Place one capsule vaginally at bedtime (Patient not taking: Reported on 07/24/2023) 30 capsule 3     I have reviewed patient's Past Medical Hx, Surgical Hx, Family Hx, Social Hx, medications and allergies.   ROS  Pertinent items noted in HPI and remainder of comprehensive ROS otherwise negative.   PHYSICAL EXAM  Patient Vitals for the past 24 hrs:  BP Temp Temp  src Pulse Resp SpO2 Height Weight  08/20/23 1151 107/71 -- -- 75 16 96 % 5' 7 (1.702 m) 117.9 kg  08/20/23 1135 112/66 98.6 F (37 C) Oral 89 16 -- -- --    Constitutional: Well-developed, obese female in no acute distress.  Cardiovascular: normal  rate & rhythm, warm and well-perfused Respiratory: normal effort, no problems with respiration noted GI: Abd soft, non-tender, limited exam d/t increased maternal habitus  MS: Extremities nontender, no edema, normal ROM Neurologic: Alert and oriented x 4.  Pelvic: deferred      Fetal Tracing: cat 1 reactive for GA  Baseline: 140 Variability: moderate Accelerations: present Decelerations:absent Toco: no ctx     MDM & MAU COURSE  MDM:  HIGH  Decreased fetal movement at 27 weeks 6 days  Prenatal records reviewed Physical exam performed NST in progress Missed dose of Lovenox  ordered from pharmacy  - Patient acknowledges good FM's while here on monitor - Received her Lovenox  ( missed dose from this morning) - She reports the pharmacy has her Lovenox  ready for pick up now  Plan for discharge   MAU Course: Orders Placed This Encounter  Procedures   Discharge patient Discharge disposition: 01-Home or Self Care; Discharge patient date: 08/20/2023   Meds ordered this encounter  Medications   enoxaparin  (LOVENOX ) injection 120 mg    I have reviewed the patient chart and performed the physical exam . I have ordered & interpreted the lab results and reviewed and interpreted the NST Medications ordered as stated below.  A/P as described below.  Counseling and education provided and patient agreeable  with plan as described below. Verbalized understanding.    ASSESSMENT   1. Decreased fetal movement during pregnancy, antepartum, single or unspecified fetus   2. [redacted] weeks gestation of pregnancy   3. NST (non-stress test) reactive on fetal surveillance   4. Medication dose missed      PLAN  Discharge home in stable condition  with return precautions.   See AVS for full description of information given to the patient including both verbal and written. Patient verbalized understanding and agrees with the plan as described above.     Follow-up Information     [facility] Follow up.   Specialty: Obstetrics and Gynecology Why: If symptoms worsen or fail to resolve, As scheduled for ongoing prenatal care Contact information: [address] [place]  [phone]                Allergies as of 08/20/2023       Reactions   Milk (cow) Anaphylaxis   Milk-related Compounds Anaphylaxis   Penicillins Hives, Anaphylaxis, Dermatitis   Has patient had a PCN reaction causing immediate rash, facial/tongue/throat swelling, SOB or lightheadedness with hypotension: Yes Has patient had a PCN reaction causing severe rash involving mucus membranes or skin necrosis: No Has patient had a PCN reaction that required hospitalization: No Has patient had a PCN reaction occurring within the last 10 years: No If all of the above answers are NO, then may proceed with Cephalosporin use. Has patient had a PCN reaction causing immediate rash, facial/tongue/throat swelling, SOB or lightheadedness with hypotension: Yes Has patient had a PCN reaction causing severe rash involving mucus membranes or skin necrosis: No Has patient had a PCN reaction that required hospitalization: No Has patient had a PCN reaction occurring within the last 10 years: No If all of the above answers are NO, then may proceed with Cephalosporin use.   Latex Hives, Dermatitis   Lorazepam Other (See Comments)   PANIC   Metoclopramide  Other (See Comments)   Panic attack   Promethazine     Other Reaction(s): Fever Panic attack Other Reaction(s): Fever    Panic attack  Promethazine  Hcl    Other Reaction(s): Fever Panic attack   Phenergan  [promethazine  Hcl]    Panic attack         Medication List     TAKE these medications    acetaminophen  325 MG tablet Commonly known as: TYLENOL  Take 650 mg by mouth daily as needed for pain.   albuterol  108 (90 Base) MCG/ACT inhaler Commonly known as: VENTOLIN  HFA Inhale 2 puffs into the lungs every 6 (six) hours as needed for wheezing or shortness of breath.   ARIPiprazole  10 MG tablet Commonly known as: Abilify  Take 1 tablet (10 mg total) by mouth daily.   Blood Pressure Kit Devi 1 Device by Does not apply route once a week.   calcium  carbonate 750 MG chewable tablet Commonly known as: TUMS EX Chew 1 tablet by mouth at bedtime as needed (for nausea).   enoxaparin  120 MG/0.8ML injection Commonly known as: LOVENOX  Inject 0.8 mLs (120 mg total) into the skin every 12 (twelve) hours.   mometasone-formoterol 100-5 MCG/ACT Aero Commonly known as: DULERA Inhale 2 puffs into the lungs daily.   multivitamin with minerals tablet Take 1 tablet by mouth daily.   ondansetron  4 MG disintegrating tablet Commonly known as: ZOFRAN -ODT Take 1 tablet (4 mg total) by mouth every 6 (six) hours as needed for nausea.   pantoprazole  20 MG tablet Commonly known as: PROTONIX  Take 1 tablet (20 mg total) by mouth daily.   progesterone  200 MG capsule Commonly known as: PROMETRIUM  Place one capsule vaginally at bedtime   sucralfate  1 GM/10ML suspension Commonly known as: Carafate  Take 10 mLs (1 g total) by mouth 4 (four) times daily -  with meals and at bedtime.   Vitafol  Gummies 3.33-0.333-34.8 MG Chew CHEW 1 TABLET BY MOUTH EVERY DAY       Olam Dalton, MSN, [facility], Center for [employer]

## 2023-08-21 ENCOUNTER — Ambulatory Visit

## 2023-08-23 NOTE — Progress Notes (Unsigned)
   PRENATAL VISIT NOTE  Subjective:  Taylor Buck is a 33 y.o. H4E8877 at [redacted]w[redacted]d being seen today for ongoing prenatal care.  She is currently monitored for the following issues for this {Blank single:19197::high-risk,low-risk} pregnancy and has History of preterm delivery; Bipolar 1 disorder (HCC); History of substance use disorder; Trichomonas vaginitis; Asthma; Family history of breast cancer; Nicotine  dependence, cigarettes, uncomplicated; Obstructive sleep apnea; PCOS (polycystic ovarian syndrome); Posttraumatic stress disorder; Supervision of high risk pregnancy, antepartum; Cystic fibrosis carrier; Pathological nipple discharge; BMI 40.0-44.9, adult (HCC); Abnormal glucose tolerance test; TIA (transient ischemic attack); Anxiety disorder affecting pregnancy, antepartum; Superficial venous thrombosis of arm, left; and Plantar fasciitis, bilateral on their problem list.  Patient reports {sx:14538}.   .  .   . Denies leaking of fluid.   The following portions of the patient's history were reviewed and updated as appropriate: allergies, current medications, past family history, past medical history, past social history, past surgical history and problem list.   Objective:    There were no vitals filed for this visit.  Fetal Status:           General: Alert, oriented and cooperative. Patient is in no acute distress.  Skin: Skin is warm and dry. No rash noted.   Cardiovascular: Normal heart rate noted  Respiratory: Normal respiratory effort, no problems with respiration noted  Abdomen: Soft, gravid, appropriate for gestational age.        Pelvic: {Blank single:19197::Cervical exam performed in the presence of a chaperone,Cervical exam deferred}        Extremities: Normal range of motion.     Mental Status: Normal mood and affect. Normal behavior. Normal judgment and thought content.   Assessment and Plan:  Pregnancy: [id number] at [redacted]w[redacted]d  1. Supervision of high risk  pregnancy, antepartum (Primary) Patient doing well, feeling regular fetal movement BP, FHR, FH appropriate  2. [redacted] weeks gestation of pregnancy Anticipatory guidance about next visits/weeks of pregnancy given.   3. TIA (transient ischemic attack) Lovenox  120 mg injection Q12  4. Bipolar 1 disorder (HCC) Stable Abilify  10 mg daily   5. Cystic fibrosis carrier Amniocentesis?   6. History of preterm delivery  Prometrium  200 mg vaginally at bedtime  {Blank single:19197::Term,Preterm} labor symptoms and general obstetric precautions including but not limited to vaginal bleeding, contractions, leaking of fluid and fetal movement were reviewed in detail with the patient. Please refer to After Visit Summary for other counseling recommendations.   No follow-ups on file.  Future Appointments  Date Time Provider Department Center  08/25/2023  9:15 AM [facility] None  08/25/2023 10:55 AM Nicholaus Jorene BRAVO, PA-C [facility] None  09/04/2023  8:15 AM [facility] PROVIDER 1 [facility]  09/04/2023  8:30 AM [facility] US4 [facility]  09/09/2023  9:00 AM Tobie Arleta SQUIBB, MD [facility] None  12/30/2023 10:00 AM [facility] PHLEBOTOMIST [facility] None  12/30/2023 10:20 AM Cloretta Arley NOVAK, MD [facility] None    Jorene BRAVO Nicholaus, PA-C

## 2023-08-25 ENCOUNTER — Encounter: Admitting: Physician Assistant

## 2023-08-25 ENCOUNTER — Other Ambulatory Visit

## 2023-08-25 DIAGNOSIS — O0990 Supervision of high risk pregnancy, unspecified, unspecified trimester: Secondary | ICD-10-CM

## 2023-08-25 DIAGNOSIS — Z141 Cystic fibrosis carrier: Secondary | ICD-10-CM

## 2023-08-25 DIAGNOSIS — Z3A28 28 weeks gestation of pregnancy: Secondary | ICD-10-CM

## 2023-08-25 DIAGNOSIS — G459 Transient cerebral ischemic attack, unspecified: Secondary | ICD-10-CM

## 2023-08-25 DIAGNOSIS — F319 Bipolar disorder, unspecified: Secondary | ICD-10-CM

## 2023-08-26 ENCOUNTER — Observation Stay
Admission: EM | Admit: 2023-08-26 | Discharge: 2023-08-26 | Disposition: A | Discharge status: Home / Self Care | Attending: Obstetrics and Gynecology | Admitting: Family

## 2023-08-26 DIAGNOSIS — J45909 Unspecified asthma, uncomplicated: Secondary | ICD-10-CM | POA: Diagnosis not present

## 2023-08-26 DIAGNOSIS — O99513 Diseases of the respiratory system complicating pregnancy, third trimester: Secondary | ICD-10-CM | POA: Diagnosis not present

## 2023-08-26 DIAGNOSIS — Z8673 Personal history of transient ischemic attack (TIA), and cerebral infarction without residual deficits: Secondary | ICD-10-CM | POA: Insufficient documentation

## 2023-08-26 DIAGNOSIS — Z3A28 28 weeks gestation of pregnancy: Secondary | ICD-10-CM

## 2023-08-26 DIAGNOSIS — O219 Vomiting of pregnancy, unspecified: Secondary | ICD-10-CM | POA: Diagnosis present

## 2023-08-26 DIAGNOSIS — Z91040 Latex allergy status: Secondary | ICD-10-CM | POA: Diagnosis not present

## 2023-08-26 LAB — URINALYSIS, ROUTINE W REFLEX MICROSCOPIC
Bilirubin Urine: NEGATIVE
Glucose, UA: NEGATIVE mg/dL
Hgb urine dipstick: NEGATIVE
Ketones, ur: NEGATIVE mg/dL
Leukocytes,Ua: NEGATIVE
Nitrite: NEGATIVE
Protein, ur: NEGATIVE mg/dL
Specific Gravity, Urine: 1.015 (ref 1.005–1.030)
pH: 6 (ref 5.0–8.0)

## 2023-08-26 MED ORDER — MECLIZINE HCL 25 MG PO TABS
25.0000 mg | ORAL_TABLET | Freq: Once | ORAL | Status: AC
  Administered 2023-08-26: 25 mg via ORAL
  Filled 2023-08-26: qty 1

## 2023-08-26 MED ORDER — LACTATED RINGERS IV BOLUS
1000.0000 mL | Freq: Once | INTRAVENOUS | Status: AC
  Administered 2023-08-26: 1000 mL via INTRAVENOUS

## 2023-08-26 MED ORDER — PANTOPRAZOLE SODIUM 40 MG IV SOLR
40.0000 mg | Freq: Once | INTRAVENOUS | Status: AC
  Administered 2023-08-26: 40 mg via INTRAVENOUS
  Filled 2023-08-26: qty 10

## 2023-08-26 MED ORDER — ONDANSETRON HCL 4 MG/2ML IJ SOLN
4.0000 mg | Freq: Four times a day (QID) | INTRAMUSCULAR | Status: DC | PRN
  Administered 2023-08-26: 4 mg via INTRAVENOUS
  Filled 2023-08-26: qty 2

## 2023-08-26 MED ORDER — PANTOPRAZOLE SODIUM 20 MG PO TBEC
20.0000 mg | DELAYED_RELEASE_TABLET | Freq: Once | ORAL | Status: DC
  Filled 2023-08-26: qty 1

## 2023-08-26 MED ORDER — SUCRALFATE 1 GM/10ML PO SUSP
1.0000 g | Freq: Once | ORAL | Status: DC
  Filled 2023-08-26: qty 10

## 2023-08-26 NOTE — Final Progress Note (Signed)
 OB/Triage Note  Patient ID: Taylor Buck MRN: [id number] DOB/AGE: Jan 07, 1991 33 y.o.  Subjective  History of Present Illness: The patient is a 33 y.o. female H4E8877 at [redacted]w[redacted]d who presents for nausea/vomiting for past two days. States she hasn't been able to keep anything down. Has voided three times today. Denies new foods, sick contacts, fever, diarrhea, painful contractions, VB, LOF. Has taken ODT zofran  but it has not helped.   Past Medical History:  Diagnosis Date   Anxiety    Arthritis    Asthma    Depression    Heart murmur    Infection    chlamydia age 57   PCOS (polycystic ovarian syndrome)    Sleep apnea    TIA (transient ischemic attack) 2025   Urinary tract infection     Past Surgical History:  Procedure Laterality Date   CHOLECYSTECTOMY     HAND SURGERY     reconstruction- injury due to frostbite as child   HERNIA REPAIR     rt inguinal    No current facility-administered medications on file prior to encounter.   Current Outpatient Medications on File Prior to Encounter  Medication Sig Dispense Refill   acetaminophen  (TYLENOL ) 325 MG tablet Take 650 mg by mouth daily as needed for pain.     albuterol  (VENTOLIN  HFA) 108 (90 Base) MCG/ACT inhaler Inhale 2 puffs into the lungs every 6 (six) hours as needed for wheezing or shortness of breath.     ARIPiprazole  (ABILIFY ) 10 MG tablet Take 1 tablet (10 mg total) by mouth daily. 30 tablet 11   Blood Pressure Monitoring (BLOOD PRESSURE KIT) DEVI 1 Device by Does not apply route once a week. 1 each 0   calcium  carbonate (TUMS EX) 750 MG chewable tablet Chew 1 tablet by mouth at bedtime as needed (for nausea).     enoxaparin  (LOVENOX ) 120 MG/0.8ML injection Inject 0.8 mLs (120 mg total) into the skin every 12 (twelve) hours. 144 mL 1   mometasone-formoterol (DULERA) 100-5 MCG/ACT AERO Inhale 2 puffs into the lungs daily.     Multiple Vitamins-Minerals (MULTIVITAMIN WITH MINERALS) tablet Take 1 tablet  by mouth daily.     ondansetron  (ZOFRAN -ODT) 4 MG disintegrating tablet Take 1 tablet (4 mg total) by mouth every 6 (six) hours as needed for nausea. 20 tablet 0   pantoprazole  (PROTONIX ) 20 MG tablet Take 1 tablet (20 mg total) by mouth daily. 30 tablet 1   Prenatal Vit-Fe Phos-FA-Omega (VITAFOL  GUMMIES) 3.33-0.333-34.8 MG CHEW CHEW 1 TABLET BY MOUTH EVERY DAY 90 tablet 5   progesterone  (PROMETRIUM ) 200 MG capsule Place one capsule vaginally at bedtime (Patient not taking: Reported on 07/24/2023) 30 capsule 3   sucralfate  (CARAFATE ) 1 GM/10ML suspension Take 10 mLs (1 g total) by mouth 4 (four) times daily -  with meals and at bedtime. 420 mL 0    Allergies  Allergen Reactions   Milk (Cow) Anaphylaxis   Milk-Related Compounds Anaphylaxis   Penicillins Hives, Anaphylaxis and Dermatitis    Has patient had a PCN reaction causing immediate rash, facial/tongue/throat swelling, SOB or lightheadedness with hypotension: Yes  Has patient had a PCN reaction causing severe rash involving mucus membranes or skin necrosis: No  Has patient had a PCN reaction that required hospitalization: No  Has patient had a PCN reaction occurring within the last 10 years: No  If all of the above answers are NO, then may proceed with Cephalosporin use.  Has patient had a PCN reaction causing immediate  rash, facial/tongue/throat swelling, SOB or lightheadedness with hypotension: Yes Has patient had a PCN reaction causing severe rash involving mucus membranes or skin necrosis: No Has patient had a PCN reaction that required hospitalization: No Has patient had a PCN reaction occurring within the last 10 years: No If all of the above answers are NO, then may proceed with Cephalosporin use.   Latex Hives and Dermatitis   Lorazepam Other (See Comments)    PANIC   Metoclopramide  Other (See Comments)    Panic attack   Promethazine      Other Reaction(s): Fever  Panic attack  Other Reaction(s): Fever    Panic  attack   Promethazine  Hcl     Other Reaction(s): Fever  Panic attack   Phenergan  [Promethazine  Hcl]     Panic attack    Social History   Socioeconomic History   Marital status: Single    Spouse name: Not on file   Number of children: Not on file   Years of education: Not on file   Highest education level: Not on file  Occupational History   Not on file  Tobacco Use   Smoking status: Former    Current packs/day: 0.00    Types: Cigarettes    Quit date: 10/2022    Years since quitting: 0.8    Passive exposure: Current   Smokeless tobacco: Never   Tobacco comments:    1 PACK A DAY - 4 CIGARETTES A DAY   Vaping Use   Vaping status: Never Used  Substance and Sexual Activity   Alcohol use: No   Drug use: Not Currently    Types: Marijuana    Comment: UP UNTIL +UPT   Sexual activity: Yes    Birth control/protection: None  Other Topics Concern   Not on file  Social History Narrative   Not on file   Social Drivers of Health   Financial Resource Strain: High Risk (07/02/2023)   Received from [facility]   Overall Financial Resource Strain (CARDIA)    Difficulty of Paying Living Expenses: Very hard  Food Insecurity: No Food Insecurity (07/02/2023)   Received from [facility]   Hunger Vital Sign    Within the past 12 months, you worried that your food would run out before you got the money to buy more.: Never true    Within the past 12 months, the food you bought just didn't last and you didn't have money to get more.: Never true  Transportation Needs: No Transportation Needs (07/02/2023)   Received from [facility] - Transportation    Lack of Transportation (Medical): No    Lack of Transportation (Non-Medical): No  Physical Activity: Inactive (06/25/2023)   Exercise Vital Sign    Days of Exercise per Week: 0 days    Minutes of Exercise per Session: 0 min  Stress: Stress Concern Present (06/25/2023)   [employer] [facility] Occupational [employer] - Occupational  Stress Questionnaire    Feeling of Stress : To some extent  Social Connections: Socially Integrated (06/25/2023)   Social Connection and Isolation Panel    Frequency of Communication with Friends and Family: Twice a week    Frequency of Social Gatherings with Friends and Family: Twice a week    Attends Religious Services: 1 to 4 times per year    Active Member of [employer] or Organizations: No    Attends Banker Meetings: 1 to 4 times per year    Marital Status: Living with partner  Intimate Partner Violence: Not At Risk (06/25/2023)   Humiliation, Afraid, Rape, and Kick questionnaire    Fear of Current or Ex-Partner: No    Emotionally Abused: No    Physically Abused: No    Sexually Abused: No    Family History  Problem Relation Age of Onset   COPD Mother    Diabetes Maternal Aunt    Cancer Maternal Aunt        breast   Diabetes Maternal Grandmother    Cancer Maternal Grandmother        skin cancer   Alcoholism Other    Arthritis Other    Asthma Other    Depression Other    Hearing loss Neg Hx      ROS    Objective  Physical Exam: BP 114/64   Pulse 68   Temp 98.8 F (37.1 C) (Oral)   LMP 02/06/2023 (Approximate)   OBGyn Exam  FHT 140, mod variability, pos accels, no decels Toco no contractions  Significant Findings/ Diagnostic Studies:  UA negative- no ketones   Hospital Course: The patient was admitted to [facility] Triage for observation. Had reactive NST. Received a 1L bolus of LR, IV zofran , meclizine . Had improvement in nausea, drank some gingerale and then complained of abdominal pain. Protonix  and carafate  given, as these have worked for her in the past. Afterwards she was feeling better and ready to go home.  Assessment: 33 y.o. female H4E8877 at [redacted]w[redacted]d  N/V- improved RNST  Plan: Discharge home Follow up at next ROB Teaching: BRAT diet  Discharge Instructions     Discharge activity:  No Restrictions   Complete by: As directed    Discharge  diet:  No restrictions   Complete by: As directed    Discharge instructions   Complete by: As directed    Follow up with your OB provider at your next ROB   LABOR:  When conractions begin, you should start to time them from the beginning of one contraction to the beginning  of the next.  When contractions are 5 - 10 minutes apart or less and have been regular for at least an hour, you should call your health care provider.   Complete by: As directed    No sexual activity restrictions   Complete by: As directed    Notify physician for a general feeling that something is not right   Complete by: As directed    Notify physician for bleeding from the vagina   Complete by: As directed    Notify physician for blurring of vision or spots before the eyes   Complete by: As directed    Notify physician for chills or fever   Complete by: As directed    Notify physician for fainting spells, black outs or loss of consciousness   Complete by: As directed    Notify physician for increase in vaginal discharge   Complete by: As directed    Notify physician for increase or change in vaginal discharge   Complete by: As directed    Notify physician for intestinal cramps, with or without diarrhea, sometimes described as gas pain   Complete by: As directed    Notify physician for leaking of fluid   Complete by: As directed    Notify physician for leaking of fluid   Complete by: As directed    Notify physician for low, dull backache, unrelieved by heat or Tylenol    Complete by: As directed    Notify physician for menstrual  like cramps   Complete by: As directed    Notify physician for pain or burning when urinating   Complete by: As directed    Notify physician for pelvic pressure   Complete by: As directed    Notify physician for pelvic pressure (sudden increase)   Complete by: As directed    Notify physician for severe or continued nausea or vomiting   Complete by: As directed    Notify  physician for sudden gushing of fluid from the vagina (with or without continued leaking)   Complete by: As directed    Notify physician for sudden, constant, or occasional abdominal pain   Complete by: As directed    Notify physician for uterine contractions.  These may be painless and feel like the uterus is tightening or the baby is  balling up   Complete by: As directed    Notify physician for vaginal bleeding   Complete by: As directed    Notify physician if baby moving less than usual   Complete by: As directed    PRETERM LABOR:  Includes any of the follwing symptoms that occur between 20 - [redacted] weeks gestation.  If these symptoms are not stopped, preterm labor can result in preterm delivery, placing your baby at risk   Complete by: As directed       Allergies as of 08/26/2023       Reactions   Milk (cow) Anaphylaxis   Milk-related Compounds Anaphylaxis   Penicillins Hives, Anaphylaxis, Dermatitis   Has patient had a PCN reaction causing immediate rash, facial/tongue/throat swelling, SOB or lightheadedness with hypotension: Yes Has patient had a PCN reaction causing severe rash involving mucus membranes or skin necrosis: No Has patient had a PCN reaction that required hospitalization: No Has patient had a PCN reaction occurring within the last 10 years: No If all of the above answers are NO, then may proceed with Cephalosporin use. Has patient had a PCN reaction causing immediate rash, facial/tongue/throat swelling, SOB or lightheadedness with hypotension: Yes Has patient had a PCN reaction causing severe rash involving mucus membranes or skin necrosis: No Has patient had a PCN reaction that required hospitalization: No Has patient had a PCN reaction occurring within the last 10 years: No If all of the above answers are NO, then may proceed with Cephalosporin use.   Latex Hives, Dermatitis   Lorazepam Other (See Comments)   PANIC   Metoclopramide  Other (See Comments)   Panic  attack   Promethazine     Other Reaction(s): Fever Panic attack Other Reaction(s): Fever    Panic attack   Promethazine  Hcl    Other Reaction(s): Fever Panic attack   Phenergan  [promethazine  Hcl]    Panic attack        Medication List     TAKE these medications    acetaminophen  325 MG tablet Commonly known as: TYLENOL  Take 650 mg by mouth daily as needed for pain.   albuterol  108 (90 Base) MCG/ACT inhaler Commonly known as: VENTOLIN  HFA Inhale 2 puffs into the lungs every 6 (six) hours as needed for wheezing or shortness of breath.   ARIPiprazole  10 MG tablet Commonly known as: Abilify  Take 1 tablet (10 mg total) by mouth daily.   Blood Pressure Kit Devi 1 Device by Does not apply route once a week.   calcium  carbonate 750 MG chewable tablet Commonly known as: TUMS EX Chew 1 tablet by mouth at bedtime as needed (for nausea).   enoxaparin  120 MG/0.8ML injection Commonly  known as: LOVENOX  Inject 0.8 mLs (120 mg total) into the skin every 12 (twelve) hours.   mometasone-formoterol 100-5 MCG/ACT Aero Commonly known as: DULERA Inhale 2 puffs into the lungs daily.   multivitamin with minerals tablet Take 1 tablet by mouth daily.   ondansetron  4 MG disintegrating tablet Commonly known as: ZOFRAN -ODT Take 1 tablet (4 mg total) by mouth every 6 (six) hours as needed for nausea.   pantoprazole  20 MG tablet Commonly known as: PROTONIX  Take 1 tablet (20 mg total) by mouth daily.   progesterone  200 MG capsule Commonly known as: PROMETRIUM  Place one capsule vaginally at bedtime   sucralfate  1 GM/10ML suspension Commonly known as: Carafate  Take 10 mLs (1 g total) by mouth 4 (four) times daily -  with meals and at bedtime.   Vitafol  Gummies 3.33-0.333-34.8 MG Chew CHEW 1 TABLET BY MOUTH EVERY DAY         Total time spent taking care of this patient: 2 hours  Signed: Lolita Loots CNM, FNP 08/26/2023, 10:25 PM

## 2023-08-26 NOTE — OB Triage Note (Signed)
 Pt is G5P3 at 28.5 weeks with c/o N&V x 2 days. Ate some salad yesterday and has been taking ODT Zofran  without relief.. No specific OB complaints. FHT 140

## 2023-09-01 ENCOUNTER — Telehealth: Payer: Self-pay | Admitting: *Deleted

## 2023-09-01 NOTE — Telephone Encounter (Signed)
 Taylor Buck called to ask if Dr. Cloretta needs to see her or talk with her about her new concern about changing to daily dosing on her Lovenox . Original plan was to take bid x 3 months (started 5/4) and then convert to daily dosing.  Reports having an IV last week and the site developed two knotty clots that have since dissolved, but area is still tender. Arm was not swollen. She is [redacted] weeks pregnant now.  H/O Thrombus in the left cephalic and basilic veins.

## 2023-09-01 NOTE — Telephone Encounter (Signed)
 Called patient to let her know that Dr. Cloretta still thinks the daily prophylactic dose is adequate, but since her OB-GYN reordered the bid dosing, she can dose the medication in regards to safety to fetus.

## 2023-09-02 ENCOUNTER — Ambulatory Visit: Admitting: Obstetrics and Gynecology

## 2023-09-02 ENCOUNTER — Other Ambulatory Visit

## 2023-09-02 VITALS — BP 106/72 | HR 87 | Wt 260.0 lb

## 2023-09-02 DIAGNOSIS — Z3A29 29 weeks gestation of pregnancy: Secondary | ICD-10-CM

## 2023-09-02 DIAGNOSIS — Z23 Encounter for immunization: Secondary | ICD-10-CM

## 2023-09-02 DIAGNOSIS — O99333 Smoking (tobacco) complicating pregnancy, third trimester: Secondary | ICD-10-CM

## 2023-09-02 DIAGNOSIS — Z8751 Personal history of pre-term labor: Secondary | ICD-10-CM

## 2023-09-02 DIAGNOSIS — O0990 Supervision of high risk pregnancy, unspecified, unspecified trimester: Secondary | ICD-10-CM

## 2023-09-02 DIAGNOSIS — O0993 Supervision of high risk pregnancy, unspecified, third trimester: Secondary | ICD-10-CM | POA: Diagnosis not present

## 2023-09-02 DIAGNOSIS — Z6841 Body Mass Index (BMI) 40.0 and over, adult: Secondary | ICD-10-CM

## 2023-09-02 DIAGNOSIS — I82612 Acute embolism and thrombosis of superficial veins of left upper extremity: Secondary | ICD-10-CM

## 2023-09-02 DIAGNOSIS — G459 Transient cerebral ischemic attack, unspecified: Secondary | ICD-10-CM

## 2023-09-02 DIAGNOSIS — Z87898 Personal history of other specified conditions: Secondary | ICD-10-CM | POA: Diagnosis not present

## 2023-09-02 MED ORDER — NICOTINE 14 MG/24HR TD PT24: 14.0000 mg | MEDICATED_PATCH | Freq: Every day | TRANSDERMAL | 4 refills | Status: DC

## 2023-09-02 NOTE — Progress Notes (Signed)
   PRENATAL VISIT NOTE  Subjective:  Taylor Buck is a 33 y.o. H4E8877 at [redacted]w[redacted]d being seen today for ongoing prenatal care.  She is currently monitored for the following issues for this high-risk pregnancy and has History of preterm delivery; Bipolar 1 disorder (HCC); History of substance use disorder; Trichomonas vaginitis; Asthma; Family history of breast cancer; Nicotine  dependence, cigarettes, uncomplicated; Obstructive sleep apnea; PCOS (polycystic ovarian syndrome); Posttraumatic stress disorder; Supervision of high risk pregnancy, antepartum; Cystic fibrosis carrier; Pathological nipple discharge; BMI 40.0-44.9, adult (HCC); Abnormal glucose tolerance test; TIA (transient ischemic attack); Anxiety disorder affecting pregnancy, antepartum; Superficial venous thrombosis of arm, left; Plantar fasciitis, bilateral; and Nausea and vomiting in pregnancy on their problem list.  Patient doing well with no acute concerns today. She reports no complaints.  Contractions: Irritability. Vag. Bleeding: None.  Movement: Present. Denies leaking of fluid.   The following portions of the patient's history were reviewed and updated as appropriate: allergies, current medications, past family history, past medical history, past social history, past surgical history and problem list. Problem list updated.  Objective:   Vitals:   09/02/23 0825  BP: 106/72  Pulse: 87  Weight: 260 lb (117.9 kg)    Fetal Status: Fetal Heart Rate (bpm): 155 Fundal Height: 30 cm Movement: Present     General:  Alert, oriented and cooperative. Patient is in no acute distress.  Skin: Skin is warm and dry. No rash noted.   Cardiovascular: Normal heart rate noted  Respiratory: Normal respiratory effort, no problems with respiration noted  Abdomen: Soft, gravid, appropriate for gestational age.  Pain/Pressure: Present     Pelvic: Cervical exam deferred        Extremities: Normal range of motion.     Mental Status:   Normal mood and affect. Normal behavior. Normal judgment and thought content.   Assessment and Plan:  Pregnancy: H4E8877 at [redacted]w[redacted]d  1. Supervision of high risk pregnancy, antepartum (Primary) Continue routine prenatal care Pt is still debating amniocentesis regarding cystic fibrosis - Tdap vaccine greater than or equal to 7yo IM  2. [redacted] weeks gestation of pregnancy   3. History of substance use disorder   4. History of preterm delivery No s/sx of preterm labor  5. BMI 40.0-44.9, adult (HCC)   6. Superficial venous thrombosis of arm, left Hx of thrombosis, not active, pt is on therapeutic prophylaxis, MD recommended staying on current dosage  7. TIA (transient ischemic attack) See above  8. Smoking (tobacco) complicating pregnancy, third trimester Pt desires nicotine  patch to aid with dependence  - nicotine  (NICODERM CQ  - DOSED IN MG/24 HOURS) 14 mg/24hr patch; Place 1 patch (14 mg total) onto the skin daily.  Dispense: 28 patch; Refill: 4  Preterm labor symptoms and general obstetric precautions including but not limited to vaginal bleeding, contractions, leaking of fluid and fetal movement were reviewed in detail with the patient.  Please refer to After Visit Summary for other counseling recommendations.   Return in about 2 weeks (around 09/16/2023) for [facility], in person.   Jerilynn Buddle, MD Faculty Attending Center for [facility]

## 2023-09-02 NOTE — Progress Notes (Signed)
 Pt needs to discuss medication recommendations from hematologist.

## 2023-09-03 ENCOUNTER — Ambulatory Visit: Payer: Self-pay | Admitting: Obstetrics and Gynecology

## 2023-09-03 LAB — CBC
Hematocrit: 36.6 % (ref 34.0–46.6)
Hemoglobin: 11.7 g/dL (ref 11.1–15.9)
MCH: 26.7 pg (ref 26.6–33.0)
MCHC: 32 g/dL (ref 31.5–35.7)
MCV: 84 fL (ref 79–97)
Platelets: 293 x10E3/uL (ref 150–450)
RBC: 4.38 x10E6/uL (ref 3.77–5.28)
RDW: 14.5 % (ref 11.7–15.4)
WBC: 11.8 x10E3/uL — ABNORMAL HIGH (ref 3.4–10.8)

## 2023-09-03 LAB — RPR: RPR Ser Ql: NONREACTIVE

## 2023-09-03 LAB — GLUCOSE TOLERANCE, 2 HOURS W/ 1HR
Glucose, 1 hour: 163 mg/dL (ref 70–179)
Glucose, 2 hour: 126 mg/dL (ref 70–152)
Glucose, Fasting: 86 mg/dL (ref 70–91)

## 2023-09-03 LAB — HIV ANTIBODY (ROUTINE TESTING W REFLEX): HIV Screen 4th Generation wRfx: NONREACTIVE

## 2023-09-04 ENCOUNTER — Other Ambulatory Visit: Payer: Self-pay | Admitting: Obstetrics and Gynecology

## 2023-09-04 ENCOUNTER — Ambulatory Visit: Attending: Obstetrics and Gynecology

## 2023-09-04 ENCOUNTER — Ambulatory Visit: Admitting: Obstetrics

## 2023-09-04 ENCOUNTER — Other Ambulatory Visit: Payer: Self-pay | Admitting: *Deleted

## 2023-09-04 ENCOUNTER — Ambulatory Visit

## 2023-09-04 VITALS — BP 121/62 | HR 80

## 2023-09-04 DIAGNOSIS — G459 Transient cerebral ischemic attack, unspecified: Secondary | ICD-10-CM

## 2023-09-04 DIAGNOSIS — Z3A30 30 weeks gestation of pregnancy: Secondary | ICD-10-CM | POA: Diagnosis not present

## 2023-09-04 DIAGNOSIS — O09893 Supervision of other high risk pregnancies, third trimester: Secondary | ICD-10-CM | POA: Diagnosis not present

## 2023-09-04 DIAGNOSIS — Z6841 Body Mass Index (BMI) 40.0 and over, adult: Secondary | ICD-10-CM

## 2023-09-04 DIAGNOSIS — Z363 Encounter for antenatal screening for malformations: Secondary | ICD-10-CM | POA: Insufficient documentation

## 2023-09-04 DIAGNOSIS — O99213 Obesity complicating pregnancy, third trimester: Secondary | ICD-10-CM | POA: Diagnosis not present

## 2023-09-04 DIAGNOSIS — Z8751 Personal history of pre-term labor: Secondary | ICD-10-CM

## 2023-09-04 DIAGNOSIS — R011 Cardiac murmur, unspecified: Secondary | ICD-10-CM | POA: Diagnosis not present

## 2023-09-04 DIAGNOSIS — O2293 Venous complication in pregnancy, unspecified, third trimester: Secondary | ICD-10-CM | POA: Diagnosis not present

## 2023-09-04 DIAGNOSIS — E669 Obesity, unspecified: Secondary | ICD-10-CM

## 2023-09-04 DIAGNOSIS — Z8673 Personal history of transient ischemic attack (TIA), and cerebral infarction without residual deficits: Secondary | ICD-10-CM | POA: Diagnosis not present

## 2023-09-04 DIAGNOSIS — O2693 Pregnancy related conditions, unspecified, third trimester: Secondary | ICD-10-CM | POA: Diagnosis present

## 2023-09-04 DIAGNOSIS — O09213 Supervision of pregnancy with history of pre-term labor, third trimester: Secondary | ICD-10-CM | POA: Diagnosis not present

## 2023-09-04 DIAGNOSIS — Z8481 Family history of carrier of genetic disease: Secondary | ICD-10-CM

## 2023-09-04 DIAGNOSIS — O26893 Other specified pregnancy related conditions, third trimester: Secondary | ICD-10-CM

## 2023-09-04 DIAGNOSIS — O2233 Deep phlebothrombosis in pregnancy, third trimester: Secondary | ICD-10-CM | POA: Diagnosis not present

## 2023-09-04 DIAGNOSIS — Z141 Cystic fibrosis carrier: Secondary | ICD-10-CM

## 2023-09-04 DIAGNOSIS — O09891 Supervision of other high risk pregnancies, first trimester: Secondary | ICD-10-CM

## 2023-09-04 DIAGNOSIS — O352XX1 Maternal care for (suspected) hereditary disease in fetus, fetus 1: Secondary | ICD-10-CM

## 2023-09-04 DIAGNOSIS — Z8349 Family history of other endocrine, nutritional and metabolic diseases: Secondary | ICD-10-CM

## 2023-09-04 DIAGNOSIS — O0990 Supervision of high risk pregnancy, unspecified, unspecified trimester: Secondary | ICD-10-CM

## 2023-09-04 NOTE — Progress Notes (Signed)
 MFM Consult Note  Taylor Buck is currently at 30 weeks and 0 days.  She has been followed due to maternal obesity and history of a TIA and upper extremity thrombosis.  She is currently treated with therapeutic Lovenox  120 mg twice a day.  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  They received genetic counseling earlier in her pregnancy and were counseled regarding the availability of an amniocentesis for prenatal diagnosis of cystic fibrosis.    The patient stated that she wanted the amniocentesis performed today and she held her evening dose of Lovenox  last night in preparation for the procedure.  On today's exam, the overall EFW of 3 pounds measures at the 15th percentile for her gestational age.    There was normal amniotic fluid noted with a total AFI of 17.87 cm.  After informed consent was obtained where the risks versus benefits of the amniocentesis procedure were discussed, a timeout was performed verifying the patient's identity and the indications for the procedure.  The patient was then prepped and draped in the usual sterile fashion.  An uncomplicated amniocentesis was performed today obtaining 32 cc of straw-colored amniotic fluid which was then sent off for chromosome analysis and testing to determine if the fetus is affected with cystic fibrosis.    The patient was advised that our genetic counselor will notify her regarding the results of the amniocentesis.    Post amniocentesis instructions were discussed.    As the patient's blood type is Rh positive, a dose of RhoGam was not given following the procedure.  As the fetus is considered viable at her current gestational age, the patient was advised that I recommend an NST following the amniocentesis procedure today.    She stated that she could not stay for the NST as she had to return her sister's car.    Therefore, a BPP performed after the amniocentesis  was 8 out of 8 with vigorous fetal movements and fetal breathing movements noted.    The patient was advised to resume Lovenox  later this afternoon.  Due to maternal obesity, weekly fetal testing should be started at around 34 weeks.  She will return in 4 weeks for a BPP and growth scan.  The patient stated that all of her questions were answered today.  A total of 30 minutes was spent counseling and coordinating the care for this patient.  Greater than 50% of the time was spent in direct face-to-face contact.

## 2023-09-07 ENCOUNTER — Inpatient Hospital Stay (HOSPITAL_COMMUNITY)
Admission: AD | Admit: 2023-09-07 | Discharge: 2023-09-07 | Disposition: A | Discharge status: Home / Self Care | Attending: Family Medicine | Admitting: Family Medicine

## 2023-09-07 ENCOUNTER — Encounter (HOSPITAL_COMMUNITY): Payer: Self-pay | Admitting: Family Medicine

## 2023-09-07 DIAGNOSIS — Z8673 Personal history of transient ischemic attack (TIA), and cerebral infarction without residual deficits: Secondary | ICD-10-CM | POA: Insufficient documentation

## 2023-09-07 DIAGNOSIS — R109 Unspecified abdominal pain: Secondary | ICD-10-CM

## 2023-09-07 DIAGNOSIS — O99333 Smoking (tobacco) complicating pregnancy, third trimester: Secondary | ICD-10-CM | POA: Diagnosis not present

## 2023-09-07 DIAGNOSIS — F17290 Nicotine dependence, other tobacco product, uncomplicated: Secondary | ICD-10-CM | POA: Insufficient documentation

## 2023-09-07 DIAGNOSIS — Z141 Cystic fibrosis carrier: Secondary | ICD-10-CM | POA: Diagnosis not present

## 2023-09-07 DIAGNOSIS — J45909 Unspecified asthma, uncomplicated: Secondary | ICD-10-CM | POA: Insufficient documentation

## 2023-09-07 DIAGNOSIS — O99213 Obesity complicating pregnancy, third trimester: Secondary | ICD-10-CM | POA: Diagnosis not present

## 2023-09-07 DIAGNOSIS — O99283 Endocrine, nutritional and metabolic diseases complicating pregnancy, third trimester: Secondary | ICD-10-CM | POA: Diagnosis not present

## 2023-09-07 DIAGNOSIS — O4703 False labor before 37 completed weeks of gestation, third trimester: Secondary | ICD-10-CM | POA: Insufficient documentation

## 2023-09-07 DIAGNOSIS — O0990 Supervision of high risk pregnancy, unspecified, unspecified trimester: Secondary | ICD-10-CM

## 2023-09-07 DIAGNOSIS — O99343 Other mental disorders complicating pregnancy, third trimester: Secondary | ICD-10-CM | POA: Diagnosis not present

## 2023-09-07 DIAGNOSIS — F319 Bipolar disorder, unspecified: Secondary | ICD-10-CM | POA: Diagnosis not present

## 2023-09-07 DIAGNOSIS — A5901 Trichomonal vulvovaginitis: Secondary | ICD-10-CM

## 2023-09-07 DIAGNOSIS — G4733 Obstructive sleep apnea (adult) (pediatric): Secondary | ICD-10-CM | POA: Diagnosis not present

## 2023-09-07 DIAGNOSIS — Z3A30 30 weeks gestation of pregnancy: Secondary | ICD-10-CM

## 2023-09-07 DIAGNOSIS — E282 Polycystic ovarian syndrome: Secondary | ICD-10-CM | POA: Diagnosis not present

## 2023-09-07 DIAGNOSIS — O09293 Supervision of pregnancy with other poor reproductive or obstetric history, third trimester: Secondary | ICD-10-CM | POA: Diagnosis not present

## 2023-09-07 DIAGNOSIS — Z113 Encounter for screening for infections with a predominantly sexual mode of transmission: Secondary | ICD-10-CM | POA: Diagnosis present

## 2023-09-07 DIAGNOSIS — O26899 Other specified pregnancy related conditions, unspecified trimester: Secondary | ICD-10-CM

## 2023-09-07 DIAGNOSIS — O99353 Diseases of the nervous system complicating pregnancy, third trimester: Secondary | ICD-10-CM | POA: Diagnosis not present

## 2023-09-07 DIAGNOSIS — O26893 Other specified pregnancy related conditions, third trimester: Secondary | ICD-10-CM | POA: Diagnosis not present

## 2023-09-07 DIAGNOSIS — O99513 Diseases of the respiratory system complicating pregnancy, third trimester: Secondary | ICD-10-CM | POA: Insufficient documentation

## 2023-09-07 LAB — WET PREP, GENITAL
Clue Cells Wet Prep HPF POC: NONE SEEN
Sperm: NONE SEEN
Trich, Wet Prep: NONE SEEN
WBC, Wet Prep HPF POC: >=10 — AB (ref <10)
Yeast Wet Prep HPF POC: NONE SEEN

## 2023-09-07 MED ORDER — CYCLOBENZAPRINE HCL 5 MG PO TABS
10.0000 mg | ORAL_TABLET | Freq: Three times a day (TID) | ORAL | Status: DC | PRN
  Administered 2023-09-07: 10 mg via ORAL
  Filled 2023-09-07: qty 2

## 2023-09-07 MED ORDER — CALCIUM CARBONATE ANTACID 500 MG PO CHEW
2.0000 | CHEWABLE_TABLET | Freq: Every day | ORAL | Status: DC
  Administered 2023-09-07: 400 mg via ORAL
  Filled 2023-09-07: qty 2

## 2023-09-07 NOTE — MAU Provider Note (Signed)
 MAU Provider Note  Chief Complaint: Contractions   Event Date/Time   First Provider Initiated Contact with Patient 09/07/23 2002     SUBJECTIVE HPI: Taylor Buck is a 33 y.o. [id number] at [redacted]w[redacted]d by LMP who presents to maternity admissions reporting contractions since Thursday after her amniocentesis. Tylenol  partially helpful.   Pregnancy c/b hx of preterm delivery, Bipolar I, hx SUD, trichomonas, asthma, OSA, PCOS, CF carrier, BMI 44, TIA, left UE superficial VTE.   Receives [facility] with [facility].   HPI  Past Medical History:  Diagnosis Date   Anxiety    Arthritis    Asthma    inhaler laat used today   Depression    Heart murmur    Infection    chlamydia age 37   PCOS (polycystic ovarian syndrome)    Sleep apnea    TIA (transient ischemic attack) 2025   Urinary tract infection    Past Surgical History:  Procedure Laterality Date   CHOLECYSTECTOMY     HAND SURGERY     reconstruction- injury due to frostbite as child   HERNIA REPAIR     rt inguinal   Social History   Socioeconomic History   Marital status: Single    Spouse name: Not on file   Number of children: Not on file   Years of education: Not on file   Highest education level: Not on file  Occupational History   Not on file  Tobacco Use   Smoking status: Some Days    Current packs/day: 0.00    Types: Cigarettes    Last attempt to quit: 10/2022    Years since quitting: 0.8    Passive exposure: Current   Smokeless tobacco: Former   Tobacco comments:    1 PACK A DAY - 4 CIGARETTES A DAY; nicotine  patches now  Vaping Use   Vaping status: Never Used  Substance and Sexual Activity   Alcohol use: No   Drug use: Not Currently    Types: Marijuana    Comment: UP UNTIL +UPT   Sexual activity: Yes    Birth control/protection: None  Other Topics Concern   Not on file  Social History Narrative   Not on file   Social Drivers of Health   Financial Resource Strain: High Risk (07/02/2023)    Received from [facility]   Overall Financial Resource Strain (CARDIA)    Difficulty of Paying Living Expenses: Very hard  Food Insecurity: No Food Insecurity (07/02/2023)   [facility]   Hunger Vital Sign    Within the past 12 months, you worried that your food would run out before you got the money to buy more.: Never true    Within the past 12 months, the food you bought just didn't last and you didn't have money to get more.: Never true  Transportation Needs: No Transportation Needs (07/02/2023)   Received from [facility] - [facility]    Lack of Transportation (Medical): No    Lack of Transportation (Non-Medical): No  Physical Activity: Inactive (06/25/2023)   Exercise Vital Sign    Days of Exercise per Week: 0 days    Minutes of Exercise per Session: 0 min  Stress: Stress Concern Present (06/25/2023)   [employer] [facility] [employer]ational [employer] - Occupational Stress Questionnaire    Feeling of Stress : To some extent  Social Connections: Socially Integrated (06/25/2023)   Social Connection and Isolation Panel    Frequency of Communication with Friends and Family:  Twice a week    Frequency of Social Gatherings with Friends and Family: Twice a week    Attends Religious Services: 1 to 4 times per year    Active Member of [employer] or Organizations: No    Attends Banker Meetings: 1 to 4 times per year    Marital Status: Living with partner  Intimate Partner Violence: Not At Risk (06/25/2023)   Humiliation, Afraid, Rape, and Kick questionnaire    Fear of Current or Ex-Partner: No    Emotionally Abused: No    Physically Abused: No    Sexually Abused: No   No current facility-administered medications on file prior to encounter.   Current Outpatient Medications on File Prior to Encounter  Medication Sig Dispense Refill   acetaminophen  (TYLENOL ) 325 MG tablet Take 650 mg by mouth daily as needed for pain.     albuterol  (VENTOLIN  HFA) 108 (90  Base) MCG/ACT inhaler Inhale 2 puffs into the lungs every 6 (six) hours as needed for wheezing or shortness of breath.     ARIPiprazole  (ABILIFY ) 10 MG tablet Take 1 tablet (10 mg total) by mouth daily. 30 tablet 11   calcium  carbonate (TUMS EX) 750 MG chewable tablet Chew 1 tablet by mouth at bedtime as needed (for nausea).     enoxaparin  (LOVENOX ) 120 MG/0.8ML injection Inject 0.8 mLs (120 mg total) into the skin every 12 (twelve) hours. (Patient taking differently: Inject 120 mg into the skin daily.) 144 mL 1   mometasone-formoterol (DULERA) 100-5 MCG/ACT AERO Inhale 2 puffs into the lungs daily.     Multiple Vitamins-Minerals (MULTIVITAMIN WITH MINERALS) tablet Take 1 tablet by mouth daily.     nicotine  (NICODERM CQ  - DOSED IN MG/24 HOURS) 14 mg/24hr patch Place 1 patch (14 mg total) onto the skin daily. 28 patch 4   ondansetron  (ZOFRAN -ODT) 4 MG disintegrating tablet Take 1 tablet (4 mg total) by mouth every 6 (six) hours as needed for nausea. 20 tablet 0   pantoprazole  (PROTONIX ) 20 MG tablet Take 1 tablet (20 mg total) by mouth daily. 30 tablet 1   Prenatal Vit-Fe Phos-FA-Omega (VITAFOL  GUMMIES) 3.33-0.333-34.8 MG CHEW CHEW 1 TABLET BY MOUTH EVERY DAY 90 tablet 5   sucralfate  (CARAFATE ) 1 GM/10ML suspension Take 10 mLs (1 g total) by mouth 4 (four) times daily -  with meals and at bedtime. 420 mL 0   Blood Pressure Monitoring (BLOOD PRESSURE KIT) DEVI 1 Device by Does not apply route once a week. 1 each 0   Allergies  Allergen Reactions   Milk (Cow) Anaphylaxis   Milk-Related Compounds Anaphylaxis   Penicillins Hives, Anaphylaxis and Dermatitis    Has patient had a PCN reaction causing immediate rash, facial/tongue/throat swelling, SOB or lightheadedness with hypotension: Yes  Has patient had a PCN reaction causing severe rash involving mucus membranes or skin necrosis: No  Has patient had a PCN reaction that required hospitalization: No  Has patient had a PCN reaction occurring within  the last 10 years: No  If all of the above answers are NO, then may proceed with Cephalosporin use.  Has patient had a PCN reaction causing immediate rash, facial/tongue/throat swelling, SOB or lightheadedness with hypotension: Yes Has patient had a PCN reaction causing severe rash involving mucus membranes or skin necrosis: No Has patient had a PCN reaction that required hospitalization: No Has patient had a PCN reaction occurring within the last 10 years: No If all of the above answers are NO, then may proceed with  Cephalosporin use.   Latex Hives and Dermatitis   Lorazepam Other (See Comments)    PANIC   Metoclopramide  Other (See Comments)    Panic attack   Promethazine      Other Reaction(s): Fever  Panic attack  Other Reaction(s): Fever    Panic attack   Promethazine  Hcl     Other Reaction(s): Fever  Panic attack   Phenergan  [Promethazine  Hcl]     Panic attack    ROS:  Pertinent positives/negatives listed above.  I have reviewed patient's Past Medical Hx, Surgical Hx, Family Hx, Social Hx, medications and allergies.   Physical Exam  Patient Vitals for the past 24 hrs:  BP Temp Temp src Pulse Resp SpO2 Height Weight  09/07/23 1945 -- -- -- -- -- 96 % -- --  09/07/23 1930 -- -- -- -- -- 98 % -- --  09/07/23 1920 120/65 99 F (37.2 C) Oral 85 -- -- -- --  09/07/23 1916 -- -- -- -- 17 -- 5' 8 (1.727 m) 118.4 kg   Constitutional: Well-developed, well-nourished female in no acute distress  Cardiovascular: normal rate Respiratory: normal effort GI: Abd soft, non-tender MS: Extremities nontender, no edema, normal ROM Neurologic: Alert and oriented x 4  GU: Neg CVAT.  PELVIC EXAM: Cervix pink, visually 1-2 cm multiparous, without lesion, moderate mucoid discharge, vaginal walls and external genitalia normal Bimanual exam: Cervix 0/long/high, firm, anterior, neg CMT, uterus non-tender  FHT:  Baseline 140, moderate variability, accelerations present, no  decelerations Contractions: Rare  LAB RESULTS Results for orders placed or performed during the hospital encounter of 09/07/23 (from the past 24 hours)  Wet prep, genital     Status: Abnormal   Collection Time: 09/07/23  8:11 PM   Specimen: PATH Cytology Cervicovaginal Ancillary Only  Result Value Ref Range   Yeast Wet Prep HPF POC NONE SEEN NONE SEEN   Trich, Wet Prep NONE SEEN NONE SEEN   Clue Cells Wet Prep HPF POC NONE SEEN NONE SEEN   WBC, Wet Prep HPF POC >=10 (A) <10   Sperm NONE SEEN     A/Positive/-- (03/20 0910)  IMAGING US  MFM OB FOLLOW UP Result Date: 09/04/2023 ----------------------------------------------------------------------  OBSTETRICS REPORT                       (Signed Final 09/04/2023 11:21 pm) ---------------------------------------------------------------------- Patient Info  ID #:       [id number]                          D.O.B.:  February 13, 1990 (33 yrs)(F)  Name:       Taylor D JERNIGAN-               Visit Date: 09/04/2023 08:15 am              Buck ---------------------------------------------------------------------- Performed By  Attending:        Steffan Keys MD         Ref. Address:      [address]                                                              [address]  [place], [place]                                                              [address]  Performed By:     Cosette Mor         [facility]                                   [facility]  Referred By:      [facility] ---------------------------------------------------------------------- Orders  #  Description                           Code        Ordered By  1  US  MFM OB FOLLOW UP                   76816.01    RAVI SHANKAR  2  US  MFM  AMNIOCENTESIS                  23053.98    RAVI SHANKAR  3  US  MFM FETAL BPP WO NON               76819.01    RAVI [facility]     STRESS ----------------------------------------------------------------------  #  Order #                     Accession #                Episode #  1  [id number]                   [id number]                 [id number]  2  [id number]                   [id number]                 [id number]  3  [id number]                   [id number]                 [id number] ---------------------------------------------------------------------- Indications  Medical complication of pregnancy (Maternal  O26.90  heart murmur and FOB born with  cardiomyopathy)  Cystic Fibrosis (CF) Carrier, third trimester   O09.892  (FOB not yet tested)  Deep vein thrombosis (DVT- Lovenox )             O22.30  Obesity complicating pregnancy, third           O99.212  trimester (39)  Poor obstetric history: Previous preterm        O09.219  delivery, antepartum (34w- SVD; on vaginal  progesterone )  History of Transient ischemic attack            Z86.73  Encounter for antenatal screening for           Z36.3  malformations  LR female  [redacted] weeks gestation of pregnancy                 Z3A.30 ---------------------------------------------------------------------- Vital Signs  BP:          121/62 ---------------------------------------------------------------------- Fetal Evaluation  Num Of Fetuses:          1  Fetal Heart Rate(bpm):   145  Cardiac Activity:        Observed  Presentation:            Cephalic  Placenta:                Posterior  P. Cord Insertion:       Previously seen  Amniotic Fluid  AFI FV:      Within normal limits  AFI Sum(cm)     %Tile       Largest Pocket(cm)  17.87           67          5.57  RUQ(cm)       RLQ(cm)       LUQ(cm)        LLQ(cm)  3.44          3.67          5.57           5.19 ---------------------------------------------------------------------- Biophysical Evaluation  Amniotic F.V:   Pocket => 2 cm              F. Tone:         Observed  F. Movement:    Observed                   Score:           8/8  F. Breathing:   Observed ---------------------------------------------------------------------- Biometry  BPD:        75  mm     G. Age:  30w 1d         40  %    CI:        74.74   %    70 - 86                                                          FL/HC:       19.6  %    19.2 - 21.4  HC:      275.3  mm     G. Age:  30w 1d         18  %    HC/AC:  1.10       0.99 - 1.21  AC:      250.6  mm     G. Age:  29w 2d         24  %    FL/BPD:      72.0  %    71 - 87  FL:         54  mm     G. Age:  28w 4d          7  %    FL/AC:       21.5  %    20 - 24  LV:        6.2  mm  Est. FW:    1350   gm          3 lb     15  % ---------------------------------------------------------------------- OB History  Blood Type:   A+  Gravidity:    5         Term:   1        Prem:   1        SAB:   2  Living:       2 ---------------------------------------------------------------------- Gestational Age  LMP:           30w 0d        Date:  02/06/23                   EDD:   11/13/23  U/S Today:     29w 4d                                        EDD:   11/16/23  Best:          30w 0d     Det. By:  LMP  (02/06/23)          EDD:   11/13/23 ---------------------------------------------------------------------- Anatomy  Ventricles:            Appears normal         Stomach:                Appears normal, left                                                                        sided  Heart:                 Appears normal         Kidneys:                Appear normal                         (4CH, axis, and                         situs)  Diaphragm:             Appears normal         Bladder:  Appears normal ---------------------------------------------------------------------- Guided Procedures  Type:   Amniocentesis  FH Pre Procedure:        145  FH Post Procedure:       159  Rh Type:                 A+  Rh Immunoglobulin:       Not  required, Rh positive  Discharge Instructions:  Post-procedure instructions given  Needle Insertions:       20 gauge x 1  Vol. Withdrawn:          32 ml of clear amniotic fluid  Catheter Passes:         1 pass  Transabdominal:          Yes  Complications:  None  Comment:        Informed consent was obtained. A time-out was                  performed before the procedure. Patient tolerated the                  procedure well. We gave her post-procedure instructions. ---------------------------------------------------------------------- Comments  Taylor Buck is currently at 30 weeks and 0  days.  She has been followed due to maternal obesity and  history of a TIA and upper extremity thrombosis.  She is  currently treated with therapeutic Lovenox  120 mg twice a  day.  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  They received genetic  counseling earlier in her pregnancy and were counseled  regarding the availability of an amniocentesis for prenatal  diagnosis of cystic fibrosis.  The patient stated that she wanted the amniocentesis  performed today and she held her evening dose of Lovenox   last night in preparation for the procedure.  On today's exam, the overall EFW of 3 pounds measures at  the 15th percentile for her gestational age.  There was normal amniotic fluid noted with a total AFI of  17.87 cm.  After informed consent was obtained where the risks versus  benefits of the amniocentesis procedure were discussed, a  timeout was performed verifying the patient's identity and the  indications for the procedure.  The patient was then prepped  and draped in the usual sterile fashion.  An uncomplicated amniocentesis was performed today  obtaining 32 cc of straw-colored amniotic fluid which was  then sent off for chromosome analysis and testing to  determine if the fetus is affected with cystic fibrosis.  The patient was advised that our genetic  counselor will notify  her regarding the results of the amniocentesis.  Post amniocentesis instructions were discussed.  As the patient's blood type is Rh positive, a dose of RhoGam  was not given following the procedure.  As the fetus is considered viable at her current gestational  age, the patient was advised that I recommend an NST  following the amniocentesis procedure today.  She stated that she could not stay for the NST as she had to  return her sister's car.  Therefore, a BPP performed after the amniocentesis was 8  out of 8 with vigorous fetal movements and fetal breathing  movements noted.  The patient was advised to resume Lovenox  later this  afternoon.  Due to maternal obesity, weekly fetal testing should be  started at around 34 weeks.  She will return in 4 weeks for a BPP and growth scan.  The  patient stated that all of her questions were answered  today.  A total of 30 minutes was spent counseling and coordinating  the care for this patient.  Greater than 50% of the time was  spent in direct face-to-face contact. ----------------------------------------------------------------------                  Steffan Keys, MD Electronically Signed Final Report   09/04/2023 11:21 pm ----------------------------------------------------------------------   US  MFM AMNIOCENTESIS Result Date: 09/04/2023 ----------------------------------------------------------------------  OBSTETRICS REPORT                       (Signed Final 09/04/2023 11:21 pm) ---------------------------------------------------------------------- Patient Info  ID #:       [id number]                          D.O.B.:  1990/05/04 (33 yrs)(F)  Name:       Taylor JONETTA MALLICK-               Visit Date: 09/04/2023 08:15 am              Buck ---------------------------------------------------------------------- Performed By  Attending:        Steffan Keys MD         Ref. Address:      [address]                                                              [place], [place]                                                              [address]  Performed By:     Cosette Mor         [facility]                                   [facility]  Referred By:      [facility] ---------------------------------------------------------------------- Orders  #  Description                           Code        Ordered By  1  US  MFM OB FOLLOW UP                   [id number]    RAVI SHANKAR  2  US  MFM AMNIOCENTESIS                  23053.98    RAVI SHANKAR  3  US  MFM FETAL BPP WO NON               76819.01    RAVI SHANKAR     STRESS ----------------------------------------------------------------------  #  Order #                     Accession #                Episode #  1  [id number]                   [id number]                 [id number]  2  [id number]                   [id number]                 [id number]  3  [id number]                   [id number]                 [id number] ---------------------------------------------------------------------- Indications  Medical complication of pregnancy (Maternal     O26.90  heart murmur and FOB born with  cardiomyopathy)  Cystic Fibrosis (CF) Carrier, third trimester   O09.892  (FOB not yet tested)  Deep vein thrombosis (DVT- Lovenox )             O22.30  Obesity complicating pregnancy, third           O99.212  trimester (39)  Poor obstetric history: Previous preterm        O09.219  delivery, antepartum (34w- SVD; on vaginal  progesterone )  History of Transient ischemic attack            Z86.73  Encounter for antenatal screening for           Z36.3  malformations  LR female  [redacted] weeks gestation of pregnancy                 Z3A.30  ---------------------------------------------------------------------- Vital Signs  BP:          121/62 ---------------------------------------------------------------------- Fetal Evaluation  Num Of Fetuses:          1  Fetal Heart Rate(bpm):   145  Cardiac Activity:        Observed  Presentation:            Cephalic  Placenta:                Posterior  P. Cord Insertion:       Previously seen  Amniotic Fluid  AFI FV:      Within normal limits  AFI Sum(cm)     %Tile       Largest Pocket(cm)  17.87           67          5.57  RUQ(cm)       RLQ(cm)       LUQ(cm)        LLQ(cm)  3.44          3.67          5.57           5.19 ---------------------------------------------------------------------- Biophysical Evaluation  Amniotic F.V:   Pocket => 2 cm             F. Tone:         Observed  F. Movement:    Observed                   Score:           8/8  F. Breathing:   Observed ---------------------------------------------------------------------- Biometry  BPD:        75  mm     G. Age:  30w 1d         40  %    CI:        74.74   %    70 - 86                                                          FL/HC:       19.6  %    19.2 - 21.4  HC:      275.3  mm     G. Age:  30w 1d         18  %    HC/AC:       1.10       0.99 - 1.21  AC:      250.6  mm     G. Age:  29w 2d         24  %    FL/BPD:      72.0  %    71 - 87  FL:         54  mm     G. Age:  28w 4d          7  %    FL/AC:       21.5  %    20 - 24  LV:        6.2  mm  Est. FW:    1350   gm          3 lb     15  % ---------------------------------------------------------------------- OB History  Blood Type:   A+  Gravidity:    5         Term:   1        Prem:   1        SAB:   2  Living:       2 ---------------------------------------------------------------------- Gestational Age  LMP:           30w 0d        Date:  02/06/23                   EDD:   11/13/23  U/S Today:     29w 4d  EDD:   11/16/23  Best:          minnette 0d     Det.  By:  LMP  (02/06/23)          EDD:   11/13/23 ---------------------------------------------------------------------- Anatomy  Ventricles:            Appears normal         Stomach:                Appears normal, left                                                                        sided  Heart:                 Appears normal         Kidneys:                Appear normal                         (4CH, axis, and                         situs)  Diaphragm:             Appears normal         Bladder:                Appears normal ---------------------------------------------------------------------- Guided Procedures  Type:   Amniocentesis  FH Pre Procedure:        145  FH Post Procedure:       159  Rh Type:                 A+  Rh Immunoglobulin:       Not required, Rh positive  Discharge Instructions:  Post-procedure instructions given  Needle Insertions:       20 gauge x 1  Vol. Withdrawn:          32 ml of clear amniotic fluid  Catheter Passes:         1 pass  Transabdominal:          Yes  Complications:  None  Comment:        Informed consent was obtained. A time-out was                  performed before the procedure. Patient tolerated the                  procedure well. We gave her post-procedure instructions. ---------------------------------------------------------------------- Comments  Taylor Buck is currently at 30 weeks and 0  days.  She has been followed due to maternal obesity and  history of a TIA and upper extremity thrombosis.  She is  currently treated with therapeutic Lovenox  120 mg twice a  day.  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  They received genetic  counseling earlier in her pregnancy and were counseled  regarding the availability of an amniocentesis for prenatal  diagnosis of cystic fibrosis.  The patient stated that she wanted the amniocentesis  performed today and she held  her evening dose of Lovenox   last night  in preparation for the procedure.  On today's exam, the overall EFW of 3 pounds measures at  the 15th percentile for her gestational age.  There was normal amniotic fluid noted with a total AFI of  17.87 cm.  After informed consent was obtained where the risks versus  benefits of the amniocentesis procedure were discussed, a  timeout was performed verifying the patient's identity and the  indications for the procedure.  The patient was then prepped  and draped in the usual sterile fashion.  An uncomplicated amniocentesis was performed today  obtaining 32 cc of straw-colored amniotic fluid which was  then sent off for chromosome analysis and testing to  determine if the fetus is affected with cystic fibrosis.  The patient was advised that our genetic counselor will notify  her regarding the results of the amniocentesis.  Post amniocentesis instructions were discussed.  As the patient's blood type is Rh positive, a dose of RhoGam  was not given following the procedure.  As the fetus is considered viable at her current gestational  age, the patient was advised that I recommend an NST  following the amniocentesis procedure today.  She stated that she could not stay for the NST as she had to  return her sister's car.  Therefore, a BPP performed after the amniocentesis was 8  out of 8 with vigorous fetal movements and fetal breathing  movements noted.  The patient was advised to resume Lovenox  later this  afternoon.  Due to maternal obesity, weekly fetal testing should be  started at around 34 weeks.  She will return in 4 weeks for a BPP and growth scan.  The patient stated that all of her questions were answered  today.  A total of 30 minutes was spent counseling and coordinating  the care for this patient.  Greater than 50% of the time was  spent in direct face-to-face contact. ----------------------------------------------------------------------                  Steffan Keys, MD Electronically Signed Final Report    09/04/2023 11:21 pm ----------------------------------------------------------------------   US  MFM FETAL BPP WO NON STRESS Result Date: 09/04/2023 ----------------------------------------------------------------------  OBSTETRICS REPORT                       (Signed Final 09/04/2023 11:21 pm) ---------------------------------------------------------------------- Patient Info  ID #:       [id number]                          D.O.B.:  1990/09/29 (33 yrs)(F)  Name:       Taylor JONETTA MALLICK-               Visit Date: 09/04/2023 08:15 am              Buck ---------------------------------------------------------------------- Performed By  Attending:        Steffan Keys MD         Ref. Address:      [address]                                                              [address]  [place], [place]                                                              [address]  Performed By:     Cosette Mor         [facility]  Referred By:      [facility] ---------------------------------------------------------------------- Orders  #  Description                           Code        Ordered By  1  US  MFM OB FOLLOW UP                   76816.01    RAVI SHANKAR  2  US  MFM AMNIOCENTESIS                  23053.98    RAVI SHANKAR  3  US  MFM FETAL BPP WO NON               76819.01    RAVI [facility]     STRESS ----------------------------------------------------------------------  #  Order #                     Accession #                Episode #  1  [id number]                   [id number]  2  [id number]                   [id number]                 [id number]  3  [id number]                   [id number]                  [id number] ---------------------------------------------------------------------- Indications  Medical complication of pregnancy (Maternal  O26.90  heart murmur and FOB born with  cardiomyopathy)  Cystic Fibrosis (CF) Carrier, third trimester   O09.892  (FOB not yet tested)  Deep vein thrombosis (DVT- Lovenox )             O22.30  Obesity complicating pregnancy, third           O99.212  trimester (39)  Poor obstetric history: Previous preterm        O09.219  delivery, antepartum (34w- SVD; on vaginal  progesterone )  History of Transient ischemic attack            Z86.73  Encounter for antenatal screening for           Z36.3  malformations  LR female  [redacted] weeks gestation of pregnancy                 Z3A.30 ---------------------------------------------------------------------- Vital Signs  BP:          121/62 ---------------------------------------------------------------------- Fetal Evaluation  Num Of Fetuses:          1  Fetal Heart Rate(bpm):   145  Cardiac Activity:        Observed  Presentation:            Cephalic  Placenta:                Posterior  P. Cord Insertion:       Previously seen  Amniotic Fluid  AFI FV:      Within normal limits  AFI Sum(cm)     %Tile       Largest Pocket(cm)  17.87           67          5.57  RUQ(cm)       RLQ(cm)       LUQ(cm)        LLQ(cm)  3.44          3.67          5.57           5.19 ---------------------------------------------------------------------- Biophysical Evaluation  Amniotic F.V:   Pocket => 2 cm             F. Tone:         Observed  F. Movement:    Observed                   Score:           8/8  F. Breathing:   Observed ---------------------------------------------------------------------- Biometry  BPD:        75  mm     G. Age:  30w 1d         40  %    CI:        74.74   %    70 - 86                                                          FL/HC:       19.6  %    19.2 - 21.4  HC:      275.3  mm     G. Age:  30w 1d         18  %    HC/AC:       1.10  0.99 - 1.21  AC:      250.6  mm     G. Age:  29w 2d         24  %    FL/BPD:      72.0  %    71 - 87  FL:         54  mm     G. Age:  28w 4d          7  %    FL/AC:       21.5  %    20 - 24  LV:        6.2  mm  Est. FW:    1350   gm          3 lb     15  % ---------------------------------------------------------------------- OB History  Blood Type:   A+  Gravidity:    5         Term:   1        Prem:   1        SAB:   2  Living:       2 ---------------------------------------------------------------------- Gestational Age  LMP:           30w 0d        Date:  02/06/23                   EDD:   11/13/23  U/S Today:     29w 4d                                        EDD:   11/16/23  Best:          30w 0d     Det. By:  LMP  (02/06/23)          EDD:   11/13/23 ---------------------------------------------------------------------- Anatomy  Ventricles:            Appears normal         Stomach:                Appears normal, left                                                                        sided  Heart:                 Appears normal         Kidneys:                Appear normal                         (4CH, axis, and                         situs)  Diaphragm:             Appears normal         Bladder:                Appears normal ---------------------------------------------------------------------- Guided Procedures  Type:  Amniocentesis  FH Pre Procedure:        145  FH Post Procedure:       159  Rh Type:                 A+  Rh Immunoglobulin:       Not required, Rh positive  Discharge Instructions:  Post-procedure instructions given  Needle Insertions:       20 gauge x 1  Vol. Withdrawn:          32 ml of clear amniotic fluid  Catheter Passes:         1 pass  Transabdominal:          Yes  Complications:  None  Comment:        Informed consent was obtained. A time-out was                  performed before the procedure. Patient tolerated the                  procedure well. We gave her post-procedure  instructions. ---------------------------------------------------------------------- Comments  Taylor Buck is currently at 30 weeks and 0  days.  She has been followed due to maternal obesity and  history of a TIA and upper extremity thrombosis.  She is  currently treated with therapeutic Lovenox  120 mg twice a  day.  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  They received genetic  counseling earlier in her pregnancy and were counseled  regarding the availability of an amniocentesis for prenatal  diagnosis of cystic fibrosis.  The patient stated that she wanted the amniocentesis  performed today and she held her evening dose of Lovenox   last night in preparation for the procedure.  On today's exam, the overall EFW of 3 pounds measures at  the 15th percentile for her gestational age.  There was normal amniotic fluid noted with a total AFI of  17.87 cm.  After informed consent was obtained where the risks versus  benefits of the amniocentesis procedure were discussed, a  timeout was performed verifying the patient's identity and the  indications for the procedure.  The patient was then prepped  and draped in the usual sterile fashion.  An uncomplicated amniocentesis was performed today  obtaining 32 cc of straw-colored amniotic fluid which was  then sent off for chromosome analysis and testing to  determine if the fetus is affected with cystic fibrosis.  The patient was advised that our genetic counselor will notify  her regarding the results of the amniocentesis.  Post amniocentesis instructions were discussed.  As the patient's blood type is Rh positive, a dose of RhoGam  was not given following the procedure.  As the fetus is considered viable at her current gestational  age, the patient was advised that I recommend an NST  following the amniocentesis procedure today.  She stated that she could not stay for the NST as she had to  return her  sister's car.  Therefore, a BPP performed after the amniocentesis was 8  out of 8 with vigorous fetal movements and fetal breathing  movements noted.  The patient was advised to resume Lovenox  later this  afternoon.  Due to maternal obesity, weekly fetal testing should be  started at around 34 weeks.  She will return in 4 weeks for a BPP and growth scan.  The patient stated that all of her questions were answered  today.  A total of 30 minutes was spent counseling and coordinating  the care for this patient.  Greater than 50% of the time was  spent in direct face-to-face contact. ----------------------------------------------------------------------                  Steffan Keys, MD Electronically Signed Final Report   09/04/2023 11:21 pm ----------------------------------------------------------------------    MAU Management/MDM: Orders Placed This Encounter  Procedures   Wet prep, genital   Discharge patient Discharge disposition: 01-Home or Self Care; Discharge patient date: 09/07/2023    Meds ordered this encounter  Medications   cyclobenzaprine  (FLEXERIL ) tablet 10 mg   calcium  carbonate (TUMS - dosed in mg elemental calcium ) chewable tablet 400 mg of elemental calcium     Available prenatal records reviewed.  ASSESSMENT [redacted] week gestation of pregnancy Cramping affecting pregnancy, antepartum - wet prep negative  - CG/Chlamydia collected, results pending   No contractions noted on tocometer. SVE finger tip/thick/-3 posterior firm. Low suspicion for preterm labor.   Paraspinal muscular spasm noted on exam.  - Trial of flexeril  - resolved.  PLAN Discharge home with strict return precautions. Allergies as of 09/07/2023       Reactions   Milk (cow) Anaphylaxis   Milk-related Compounds Anaphylaxis   Penicillins Hives, Anaphylaxis, Dermatitis   Has patient had a PCN reaction causing immediate rash, facial/tongue/throat swelling, SOB or lightheadedness with hypotension: Yes Has patient had  a PCN reaction causing severe rash involving mucus membranes or skin necrosis: No Has patient had a PCN reaction that required hospitalization: No Has patient had a PCN reaction occurring within the last 10 years: No If all of the above answers are NO, then may proceed with Cephalosporin use. Has patient had a PCN reaction causing immediate rash, facial/tongue/throat swelling, SOB or lightheadedness with hypotension: Yes Has patient had a PCN reaction causing severe rash involving mucus membranes or skin necrosis: No Has patient had a PCN reaction that required hospitalization: No Has patient had a PCN reaction occurring within the last 10 years: No If all of the above answers are NO, then may proceed with Cephalosporin use.   Latex Hives, Dermatitis   Lorazepam Other (See Comments)   PANIC   Metoclopramide  Other (See Comments)   Panic attack   Promethazine     Other Reaction(s): Fever Panic attack Other Reaction(s): Fever    Panic attack   Promethazine  Hcl    Other Reaction(s): Fever Panic attack   Phenergan  [promethazine  Hcl]    Panic attack        Medication List     TAKE these medications    acetaminophen  325 MG tablet Commonly known as: TYLENOL  Take 650 mg by mouth daily as needed for pain.   albuterol  108 (90 Base) MCG/ACT inhaler Commonly known as: VENTOLIN  HFA Inhale 2 puffs into the lungs every 6 (six) hours as needed for wheezing or shortness of breath.   ARIPiprazole  10 MG tablet Commonly known as: Abilify  Take 1 tablet (10 mg total) by mouth daily.   Blood Pressure Kit Devi 1 Device by Does not apply route once a week.   calcium  carbonate 750 MG chewable tablet Commonly known as: TUMS EX Chew 1 tablet by mouth at bedtime as needed (for nausea).   enoxaparin  120 MG/0.8ML injection Commonly known as: LOVENOX  Inject 0.8 mLs (120 mg total) into the skin every 12 (twelve) hours. What changed: when to take this   mometasone-formoterol 100-5 MCG/ACT  Aero Commonly known as: DULERA Inhale 2 puffs  into the lungs daily.   multivitamin with minerals tablet Take 1 tablet by mouth daily.   nicotine  14 mg/24hr patch Commonly known as: NICODERM CQ  - dosed in mg/24 hours Place 1 patch (14 mg total) onto the skin daily.   ondansetron  4 MG disintegrating tablet Commonly known as: ZOFRAN -ODT Take 1 tablet (4 mg total) by mouth every 6 (six) hours as needed for nausea.   pantoprazole  20 MG tablet Commonly known as: PROTONIX  Take 1 tablet (20 mg total) by mouth daily.   sucralfate  1 GM/10ML suspension Commonly known as: Carafate  Take 10 mLs (1 g total) by mouth 4 (four) times daily -  with meals and at bedtime.   Vitafol  Gummies 3.33-0.333-34.8 MG Chew CHEW 1 TABLET BY MOUTH EVERY DAY        Follow-up Information     [facility]. Go to.   Specialty: Obstetrics and Gynecology Why: Continue routine prenatal care as scheduled Contact information: [address] [place]  [address] [phone]                Mardy Shropshire, MD FMOB Fellow, Faculty practice [facility], [facility] for [facility]  09/07/2023  9:01 PM

## 2023-09-07 NOTE — MAU Note (Signed)
.  Taylor Buck is a 33 y.o. at [redacted]w[redacted]d here in MAU reporting: intermittent CTX that began Thursday after her amniocentesis, and progressed yesterday to Q71mins and get worse upon moving around +FM, denies LOF and vaginal bleeding. Last took tylenol  at 1730, pt stated it is not helpful and only takes it for soreness.  LMP: na Onset of complaint: yesterday at 1000 Pain score: 7/10 with ctx, 4/10 without ctx Vitals:   09/07/23 1916  Resp: 17     FHT: 153  Lab orders placed from triage: na

## 2023-09-08 LAB — GC/CHLAMYDIA PROBE AMP (~~LOC~~) NOT AT ARMC
Chlamydia: NEGATIVE
Comment: NEGATIVE
Comment: NORMAL
Neisseria Gonorrhea: NEGATIVE

## 2023-09-09 ENCOUNTER — Telehealth: Payer: Self-pay

## 2023-09-09 ENCOUNTER — Ambulatory Visit (INDEPENDENT_AMBULATORY_CARE_PROVIDER_SITE_OTHER): Admitting: Internal Medicine

## 2023-09-09 ENCOUNTER — Other Ambulatory Visit: Payer: Self-pay

## 2023-09-09 ENCOUNTER — Other Ambulatory Visit (HOSPITAL_COMMUNITY): Payer: Self-pay

## 2023-09-09 ENCOUNTER — Encounter: Payer: Self-pay | Admitting: Internal Medicine

## 2023-09-09 ENCOUNTER — Other Ambulatory Visit: Payer: Self-pay | Admitting: Internal Medicine

## 2023-09-09 VITALS — BP 112/66 | HR 90 | Temp 96.7°F | Ht 65.75 in | Wt 262.5 lb

## 2023-09-09 DIAGNOSIS — J4540 Moderate persistent asthma, uncomplicated: Secondary | ICD-10-CM | POA: Diagnosis not present

## 2023-09-09 DIAGNOSIS — G4733 Obstructive sleep apnea (adult) (pediatric): Secondary | ICD-10-CM | POA: Diagnosis not present

## 2023-09-09 DIAGNOSIS — F17218 Nicotine dependence, cigarettes, with other nicotine-induced disorders: Secondary | ICD-10-CM | POA: Diagnosis not present

## 2023-09-09 DIAGNOSIS — J3089 Other allergic rhinitis: Secondary | ICD-10-CM | POA: Diagnosis not present

## 2023-09-09 MED ORDER — BREZTRI AEROSPHERE 160-9-4.8 MCG/ACT IN AERO: 2.0000 | INHALATION_SPRAY | Freq: Two times a day (BID) | RESPIRATORY_TRACT | 5 refills | Status: DC

## 2023-09-09 MED ORDER — ALBUTEROL SULFATE (2.5 MG/3ML) 0.083% IN NEBU: 2.5000 mg | INHALATION_SOLUTION | Freq: Four times a day (QID) | RESPIRATORY_TRACT | 1 refills | Status: DC | PRN

## 2023-09-09 MED ORDER — ALBUTEROL SULFATE HFA 108 (90 BASE) MCG/ACT IN AERS: 1.0000 | INHALATION_SPRAY | Freq: Four times a day (QID) | RESPIRATORY_TRACT | 1 refills | Status: DC | PRN

## 2023-09-09 NOTE — Progress Notes (Signed)
 NEW PATIENT  Date of Service/Encounter:  09/09/23  Consult requested by: Patient, No Pcp Per   Subjective:   Taylor Buck (DOB: Jun 03, 1990) is a 33 y.o. female who presents to the clinic on 09/09/2023 with a chief complaint of Asthma (Not Controlled) and Establish Care .    History obtained from: chart review and patient.   Asthma:  Diagnosed around high school. Started smoking around age 61, quit smoking October 2024 and has restarted smoking but working on quitting with nicotine  patch.  Currently pregnant at [redacted] weeks Easily gets short of breath for the last 2-3 months  Dossie was helping but since December after COVID and with pregnancy, it is not helping  Also feels very panicked and sometimes Albuterol  does not help especially when the SOB comes on suddenly.  Denies any sensation of choking, hoarseness, voice changes.   Also has sleep apnea and does not have a machine  Multiple times a day- daytime symptoms in past month Using rescue inhaler: almost daily  Limitations to daily activity: some 0 ED visits/UC visits and 3-4x oral steroids in the past year 0 number of lifetime hospitalizations, 0 number of lifetime intubations.  Identified Triggers: exercise and respiratory illness Prior PFTs or spirometry: none Previously used therapies:  mostly PRN Albuterol  in the past.  Current regimen:  Maintenance: Dulera  Rescue: Albuterol  2 puffs q4-6 hrs PRN  Rhinitis:  Started since young. Symptoms include: nasal congestion, rhinorrhea, and sneezing but not too bad Occurs seasonally-Spring/Summer Potential triggers: not sure  Treatments tried:  None   Previous allergy testing: no History of sinus surgery: no Nonallergic triggers: none   Reviewed:  09/04/2023: followed by Davene Slack for current pregnancy at 30 weeks.  Hx of TIA on Lovenox .  Concern for CF in baby.   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    06/05/2023: seen in ED for anxiety, PTSD and  bipolar 1. On Abilify .    Past Medical History: Past Medical History:  Diagnosis Date   Anxiety    Arthritis    Asthma    inhaler laat used today   Depression    Heart murmur    Infection    chlamydia age 66   PCOS (polycystic ovarian syndrome)    Sleep apnea    TIA (transient ischemic attack) 2025   Urinary tract infection    Past Surgical History: Past Surgical History:  Procedure Laterality Date   CHOLECYSTECTOMY     HAND SURGERY     reconstruction- injury due to frostbite as child   HERNIA REPAIR     rt inguinal    Family History: Family History  Problem Relation Age of Onset   Asthma Mother    COPD Mother    Diabetes Maternal Aunt    Cancer Maternal Aunt        breast   Diabetes Maternal Grandmother    Cancer Maternal Grandmother        skin cancer   Alcoholism Other    Arthritis Other    Asthma Other    Depression Other    Hearing loss Neg Hx     Social History:  Flooring in bedroom: Engineer, civil (consulting) Pets: dog Tobacco use/exposure: 1/2ppd for 15 years  Job: CNA  Medication List:  Allergies as of 09/09/2023       Reactions   Milk (cow) Anaphylaxis   Milk-related Compounds Anaphylaxis   Penicillins Hives, Anaphylaxis, Dermatitis   Has patient had a PCN reaction causing immediate rash,  facial/tongue/throat swelling, SOB or lightheadedness with hypotension: Yes Has patient had a PCN reaction causing severe rash involving mucus membranes or skin necrosis: No Has patient had a PCN reaction that required hospitalization: No Has patient had a PCN reaction occurring within the last 10 years: No If all of the above answers are NO, then may proceed with Cephalosporin use. Has patient had a PCN reaction causing immediate rash, facial/tongue/throat swelling, SOB or lightheadedness with hypotension: Yes Has patient had a PCN reaction causing severe rash involving mucus membranes or skin necrosis: No Has patient had a PCN reaction that required hospitalization: No Has  patient had a PCN reaction occurring within the last 10 years: No If all of the above answers are NO, then may proceed with Cephalosporin use.   Latex Hives, Dermatitis   Lorazepam Other (See Comments)   PANIC   Metoclopramide  Other (See Comments)   Panic attack   Promethazine     Other Reaction(s): Fever Panic attack Other Reaction(s): Fever    Panic attack   Promethazine  Hcl    Other Reaction(s): Fever Panic attack   Phenergan  [promethazine  Hcl]    Panic attack        Medication List        Accurate as of September 09, 2023  9:41 AM. If you have any questions, ask your nurse or doctor.          acetaminophen  325 MG tablet Commonly known as: TYLENOL  Take 650 mg by mouth daily as needed for pain.   albuterol  108 (90 Base) MCG/ACT inhaler Commonly known as: VENTOLIN  HFA Inhale 2 puffs into the lungs every 6 (six) hours as needed for wheezing or shortness of breath.   ARIPiprazole  10 MG tablet Commonly known as: Abilify  Take 1 tablet (10 mg total) by mouth daily.   Blood Pressure Kit Devi 1 Device by Does not apply route once a week.   calcium  carbonate 750 MG chewable tablet Commonly known as: TUMS EX Chew 1 tablet by mouth at bedtime as needed (for nausea).   enoxaparin  120 MG/0.8ML injection Commonly known as: LOVENOX  Inject 0.8 mLs (120 mg total) into the skin every 12 (twelve) hours.   mometasone-formoterol 100-5 MCG/ACT Aero Commonly known as: DULERA Inhale 2 puffs into the lungs daily.   multivitamin with minerals tablet Take 1 tablet by mouth daily.   nicotine  14 mg/24hr patch Commonly known as: NICODERM CQ  - dosed in mg/24 hours Place 1 patch (14 mg total) onto the skin daily.   ondansetron  4 MG disintegrating tablet Commonly known as: ZOFRAN -ODT Take 1 tablet (4 mg total) by mouth every 6 (six) hours as needed for nausea.   pantoprazole  20 MG tablet Commonly known as: PROTONIX  Take 1 tablet (20 mg total) by mouth daily.   sucralfate  1  GM/10ML suspension Commonly known as: Carafate  Take 10 mLs (1 g total) by mouth 4 (four) times daily -  with meals and at bedtime.   Vitafol  Gummies 3.33-0.333-34.8 MG Chew CHEW 1 TABLET BY MOUTH EVERY DAY         REVIEW OF SYSTEMS: Pertinent positives and negatives discussed in HPI.   Objective:   Physical Exam: BP 112/66 (BP Location: Right Arm, Patient Position: Sitting, Cuff Size: Large)   Pulse 90   Temp (!) 96.7 F (35.9 C) (Temporal)   Ht 5' 5.75 (1.67 m)   Wt 262 lb 8 oz (119.1 kg)   LMP 02/06/2023 (Approximate)   SpO2 97%   BMI 42.69 kg/m  Body mass  index is 42.69 kg/m. GEN: alert, well developed HEENT: clear conjunctiva, nose with + mild inferior turbinate hypertrophy, pink nasal mucosa, slight clear rhinorrhea, no cobblestoning HEART: regular rate and rhythm, no murmur LUNGS: clear to auscultation bilaterally, no coughing, unlabored respiration ABDOMEN: soft, non distended  SKIN: no rashes or lesions   Assessment:   1. Other allergic rhinitis   2. OSA (obstructive sleep apnea)   3. Moderate persistent asthma without complication   4. Cigarette nicotine  dependence with other nicotine -induced disorder     Plan/Recommendations:  Moderate Persistent Asthma: Pregnancy 30 weeks  - MDI technique discussed.  Will give neb machine and sample of Breztri . Discussed some symptoms are from anxiety/panic attacks.  Discussed breathing exercises first to try to relax rather than using Albuterol  right away.  Will plan for spirometry after she delivers.  Some SOB is also from anatomical and physiological changes from pregnancy.   - Maintenance inhaler: start Breztri  160-9-4.24mcg 2 puffs twice daily.   - Rescue inhaler: Albuterol  2 puffs or 1 vial via nebulizer every 4-6 hours as needed for respiratory symptoms of cough, shortness of breath, or wheezing Asthma control goals:  Full participation in all desired activities (may need albuterol  before activity) Albuterol  use  two times or less a week on average (not counting use with activity) Cough interfering with sleep two times or less a month Oral steroids no more than once a year No hospitalizations   Other Allergic Rhinitis: - Use nasal saline rinses before nose sprays such as with Neilmed Sinus Rinse.  Use distilled water.   - Will consider aeroallergen testing after delivery.  OSA - Will refer to Pulm.  Tobacco Use - Work on tobacco cessation. This is the best thing you can do for your health.   Patient provided counseling on tobacco cessation today. Discussed the various impacts smoking has on health such as increased infections, lung disease, malignancy, heart disease, stroke etc. Patient is alert and competent.   Willingness to quit: using nicotine  patches and has quit Methods/skills for cessation:nicotine  patches Medication management for smoking: nicotine  patches  Resources provided include discussion of health reasons for quitting Quit date:NOW Will discuss further at next follow up visit. Spent over 3 minutes for tobacco cessation counseling.        Return in about 6 weeks (around 10/21/2023).  Arleta Blanch, MD [facility]  

## 2023-09-09 NOTE — Patient Instructions (Addendum)
 Moderate Persistent Asthma: - With sudden onset of shortness of breath, first take deep breaths- in through the nose, out through pursed lips; if symptoms resolve, you do not need your albuterol   - Maintenance inhaler: start Breztri  160-9-4.20mcg 2 puffs twice daily.  This replaces Dulera.  - Rescue inhaler: Albuterol  2 puffs or 1 vial via nebulizer every 4-6 hours as needed for respiratory symptoms of cough, shortness of breath, or wheezing Asthma control goals:  Full participation in all desired activities (may need albuterol  before activity) Albuterol  use two times or less a week on average (not counting use with activity) Cough interfering with sleep two times or less a month Oral steroids no more than once a year No hospitalizations   Other Allergic Rhinitis: - Use nasal saline rinses before nose sprays such as with Neilmed Sinus Rinse.  Use distilled water.    OSA - Will refer to Pulm.  Tobacco Use - Continue tobacco cessation. This is the best thing you can do for your health.

## 2023-09-09 NOTE — Telephone Encounter (Signed)
*  AA  Pharmacy Patient Advocate Encounter   Received notification from Physician's Office that prior authorization for Breztri  Aerosphere 160-9-4.8MCG/ACT aerosol   is required/requested.   Insurance verification completed.   The patient is insured through [facility] .   Per test claim: PA required; PA submitted to above mentioned insurance via Latent Key/confirmation #/EOC [id number] Status is pending

## 2023-09-10 ENCOUNTER — Ambulatory Visit: Payer: Self-pay | Admitting: Obstetrics and Gynecology

## 2023-09-11 ENCOUNTER — Telehealth: Payer: Self-pay | Admitting: *Deleted

## 2023-09-11 NOTE — Telephone Encounter (Signed)
 Pt called to office with questions about her d/c.  Pt states she passed some thick sticky d/c when she went to bathroom this morning. Pt made aware that this may have been mucous plug.   Pt advised to monitor for any signs of labor since she has had recent ctx.  Advised to be seen at hospital for ctx, bleeding and/or loss of fluid.   Pt advised to keep upcoming ROB appt.

## 2023-09-11 NOTE — Telephone Encounter (Signed)
 Pharmacy Patient Advocate Encounter  Received notification from [employer] that Prior Authorization for Breztri  Aero Aer Sphere has been DENIED.  Full denial letter will be uploaded to the media tab. See denial reason below.   Per your health plan's criteria, this drug is covered if you meet the following: The use of this drug is supported (for a Food and Drug Administration-approved indication or by an appropriate compendia of current literature): treatment of a lung condition that makes it hard to breathe (chronic obstructive pulmonary disease).

## 2023-09-13 ENCOUNTER — Encounter (HOSPITAL_COMMUNITY): Payer: Self-pay | Admitting: Obstetrics and Gynecology

## 2023-09-13 ENCOUNTER — Inpatient Hospital Stay (HOSPITAL_COMMUNITY)
Admission: AD | Admit: 2023-09-13 | Discharge: 2023-09-13 | Disposition: A | Discharge status: Home / Self Care | Payer: Self-pay | Attending: Obstetrics and Gynecology | Admitting: Obstetrics and Gynecology

## 2023-09-13 DIAGNOSIS — Z3A31 31 weeks gestation of pregnancy: Secondary | ICD-10-CM | POA: Diagnosis not present

## 2023-09-13 DIAGNOSIS — B9689 Other specified bacterial agents as the cause of diseases classified elsewhere: Secondary | ICD-10-CM | POA: Diagnosis not present

## 2023-09-13 DIAGNOSIS — F419 Anxiety disorder, unspecified: Secondary | ICD-10-CM

## 2023-09-13 DIAGNOSIS — O23593 Infection of other part of genital tract in pregnancy, third trimester: Secondary | ICD-10-CM | POA: Diagnosis not present

## 2023-09-13 DIAGNOSIS — O4703 False labor before 37 completed weeks of gestation, third trimester: Secondary | ICD-10-CM | POA: Diagnosis not present

## 2023-09-13 DIAGNOSIS — N760 Acute vaginitis: Secondary | ICD-10-CM

## 2023-09-13 DIAGNOSIS — R102 Pelvic and perineal pain: Secondary | ICD-10-CM | POA: Diagnosis present

## 2023-09-13 DIAGNOSIS — Z0371 Encounter for suspected problem with amniotic cavity and membrane ruled out: Secondary | ICD-10-CM | POA: Insufficient documentation

## 2023-09-13 DIAGNOSIS — Z3493 Encounter for supervision of normal pregnancy, unspecified, third trimester: Secondary | ICD-10-CM

## 2023-09-13 DIAGNOSIS — O99343 Other mental disorders complicating pregnancy, third trimester: Secondary | ICD-10-CM

## 2023-09-13 LAB — URINALYSIS, ROUTINE W REFLEX MICROSCOPIC
Bilirubin Urine: NEGATIVE
Glucose, UA: NEGATIVE mg/dL
Hgb urine dipstick: NEGATIVE
Ketones, ur: NEGATIVE mg/dL
Nitrite: NEGATIVE
Protein, ur: NEGATIVE mg/dL
Specific Gravity, Urine: 1.017 (ref 1.005–1.030)
pH: 7 (ref 5.0–8.0)

## 2023-09-13 LAB — RUPTURE OF MEMBRANE (ROM)PLUS: Rom Plus: NEGATIVE

## 2023-09-13 LAB — POCT FERN TEST: POCT Fern Test: NEGATIVE

## 2023-09-13 LAB — WET PREP, GENITAL
Sperm: NONE SEEN
Trich, Wet Prep: NONE SEEN
WBC, Wet Prep HPF POC: >=10 — AB (ref <10)
Yeast Wet Prep HPF POC: NONE SEEN

## 2023-09-13 MED ORDER — OXYCODONE HCL 5 MG PO TABS
10.0000 mg | ORAL_TABLET | Freq: Once | ORAL | Status: AC
  Administered 2023-09-13: 10 mg via ORAL
  Filled 2023-09-13: qty 2

## 2023-09-13 MED ORDER — CYCLOBENZAPRINE HCL 10 MG PO TABS: 10.0000 mg | ORAL_TABLET | Freq: Two times a day (BID) | ORAL | 0 refills | Status: DC | PRN

## 2023-09-13 MED ORDER — HYDROXYZINE HCL 25 MG PO TABS: 25.0000 mg | ORAL_TABLET | Freq: Four times a day (QID) | ORAL | 0 refills | Status: DC

## 2023-09-13 MED ORDER — METRONIDAZOLE 500 MG PO TABS: 500.0000 mg | ORAL_TABLET | Freq: Two times a day (BID) | ORAL | 0 refills | Status: DC

## 2023-09-13 MED ORDER — ONDANSETRON 4 MG PO TBDP
8.0000 mg | ORAL_TABLET | Freq: Once | ORAL | Status: AC
  Administered 2023-09-13: 8 mg via ORAL
  Filled 2023-09-13: qty 2

## 2023-09-13 NOTE — MAU Provider Note (Signed)
 Chief Complaint:  Abdominal Pain, Rupture of Membranes, and Pelvic Pain   HPI     Taylor Buck is a 33 y.o. [id number] at [redacted]w[redacted]d who presents to maternity admissions reporting at 1030 last night that she felt her underwear was wet and started feeling pelvic pressure that started at 0330.  She also states that she lost her mucous plug 2 days ago  She is also complaining of contractions 5 out of 10 on pain scale and has not taken any medication for relief.  She denies any vaginal bleeding and reports good fetal movements.   Pregnancy Course: [facility]  Past Medical History:  Diagnosis Date   Anxiety    Arthritis    Asthma    inhaler laat used today   Depression    Heart murmur    Infection    chlamydia age 74   PCOS (polycystic ovarian syndrome)    Sleep apnea    TIA (transient ischemic attack) 2025   Urinary tract infection    OB History  Gravida Para Term Preterm AB Living  5 2 1 1 2 2   SAB IAB Ectopic Multiple Live Births  2 0 0 0 2    # Outcome Date GA Lbr Len/2nd Weight Sex Type Anes PTL Lv  5 Current           4 Term 05/27/18 [redacted]w[redacted]d 10:28 / 02:40 3100 g F Vag-Spont EPI  LIV  3 SAB 04/2017          2 Preterm 07/31/12 [redacted]w[redacted]d 07:00  M Vag-Spont None Y LIV  1 SAB 2008     SAB      Past Surgical History:  Procedure Laterality Date   CHOLECYSTECTOMY     HAND SURGERY     reconstruction- injury due to frostbite as child   HERNIA REPAIR     rt inguinal   Family History  Problem Relation Age of Onset   Asthma Mother    COPD Mother    Diabetes Maternal Aunt    Cancer Maternal Aunt        breast   Diabetes Maternal Grandmother    Cancer Maternal Grandmother        skin cancer   Alcoholism Other    Arthritis Other    Asthma Other    Depression Other    Hearing loss Neg Hx    Social History   Tobacco Use   Smoking status: Some Days    Current packs/day: 0.00    Types: Cigarettes    Last attempt to quit: 10/2022    Years since quitting: 0.8     Passive exposure: Current   Smokeless tobacco: Former   Tobacco comments:    1 PACK A DAY - 4 CIGARETTES A DAY; nicotine  patches now  Vaping Use   Vaping status: Never Used  Substance Use Topics   Alcohol use: No   Drug use: Not Currently    Types: Marijuana    Comment: UP UNTIL +UPT   Allergies  Allergen Reactions   Milk (Cow) Anaphylaxis   Milk-Related Compounds Anaphylaxis   Penicillins Hives, Anaphylaxis and Dermatitis    Has patient had a PCN reaction causing immediate rash, facial/tongue/throat swelling, SOB or lightheadedness with hypotension: Yes  Has patient had a PCN reaction causing severe rash involving mucus membranes or skin necrosis: No  Has patient had a PCN reaction that required hospitalization: No  Has patient had a PCN reaction occurring within the last 10 years: No  If all of the above answers are NO, then may proceed with Cephalosporin use.  Has patient had a PCN reaction causing immediate rash, facial/tongue/throat swelling, SOB or lightheadedness with hypotension: Yes Has patient had a PCN reaction causing severe rash involving mucus membranes or skin necrosis: No Has patient had a PCN reaction that required hospitalization: No Has patient had a PCN reaction occurring within the last 10 years: No If all of the above answers are NO, then may proceed with Cephalosporin use.   Latex Hives and Dermatitis   Lorazepam Other (See Comments)    PANIC   Metoclopramide  Other (See Comments)    Panic attack   Promethazine      Other Reaction(s): Fever  Panic attack  Other Reaction(s): Fever    Panic attack   Promethazine  Hcl     Other Reaction(s): Fever  Panic attack   Phenergan  [Promethazine  Hcl]     Panic attack   Medications Prior to Admission  Medication Sig Dispense Refill Last Dose/Taking   albuterol  (VENTOLIN  HFA) 108 (90 Base) MCG/ACT inhaler Inhale 1-2 puffs into the lungs every 6 (six) hours as needed for shortness of breath or wheezing. 8 g 1  Past Week   ARIPiprazole  (ABILIFY ) 10 MG tablet Take 1 tablet (10 mg total) by mouth daily. 30 tablet 11 09/12/2023   budesonide-glycopyrrolate-formoterol (BREZTRI  AEROSPHERE) 160-9-4.8 MCG/ACT AERO inhaler Inhale 2 puffs into the lungs in the morning and at bedtime. 10.7 g 5 09/12/2023   enoxaparin  (LOVENOX ) 120 MG/0.8ML injection Inject 0.8 mLs (120 mg total) into the skin every 12 (twelve) hours. 144 mL 1 09/12/2023   pantoprazole  (PROTONIX ) 20 MG tablet Take 1 tablet (20 mg total) by mouth daily. 30 tablet 1 09/12/2023   Prenatal Vit-Fe Phos-FA-Omega (VITAFOL  GUMMIES) 3.33-0.333-34.8 MG CHEW CHEW 1 TABLET BY MOUTH EVERY DAY 90 tablet 5 09/12/2023   sucralfate  (CARAFATE ) 1 GM/10ML suspension Take 10 mLs (1 g total) by mouth 4 (four) times daily -  with meals and at bedtime. 420 mL 0 09/12/2023   acetaminophen  (TYLENOL ) 325 MG tablet Take 650 mg by mouth daily as needed for pain.      albuterol  (PROVENTIL ) (2.5 MG/3ML) 0.083% nebulizer solution Take 3 mLs (2.5 mg total) by nebulization every 6 (six) hours as needed for shortness of breath or wheezing. 75 mL 1    albuterol  (VENTOLIN  HFA) 108 (90 Base) MCG/ACT inhaler Inhale 2 puffs into the lungs every 6 (six) hours as needed for wheezing or shortness of breath.      Blood Pressure Monitoring (BLOOD PRESSURE KIT) DEVI 1 Device by Does not apply route once a week. 1 each 0    calcium  carbonate (TUMS EX) 750 MG chewable tablet Chew 1 tablet by mouth at bedtime as needed (for nausea).      mometasone-formoterol (DULERA) 100-5 MCG/ACT AERO Inhale 2 puffs into the lungs daily.      Multiple Vitamins-Minerals (MULTIVITAMIN WITH MINERALS) tablet Take 1 tablet by mouth daily.      nicotine  (NICODERM CQ  - DOSED IN MG/24 HOURS) 14 mg/24hr patch Place 1 patch (14 mg total) onto the skin daily. 28 patch 4    ondansetron  (ZOFRAN -ODT) 4 MG disintegrating tablet Take 1 tablet (4 mg total) by mouth every 6 (six) hours as needed for nausea. 20 tablet 0     I have  reviewed patient's Past Medical Hx, Surgical Hx, Family Hx, Social Hx, medications and allergies.   ROS  Pertinent items noted in HPI and remainder of comprehensive ROS  otherwise negative.   PHYSICAL EXAM  Patient Vitals for the past 24 hrs:  BP Temp Temp src Pulse Resp SpO2 Height Weight  09/13/23 0740 -- -- -- -- -- 97 % -- --  09/13/23 0735 -- -- -- -- -- 98 % -- --  09/13/23 0730 -- -- -- -- -- 98 % -- --  09/13/23 0725 -- -- -- -- -- 97 % -- --  09/13/23 0720 -- -- -- -- -- 96 % -- --  09/13/23 0715 -- -- -- -- -- 95 % -- --  09/13/23 0710 -- -- -- -- -- 98 % -- --  09/13/23 0705 -- -- -- -- -- 97 % -- --  09/13/23 0700 -- -- -- -- -- 95 % -- --  09/13/23 0555 131/73 98.1 F (36.7 C) Oral 93 14 -- 5' 6 (1.676 m) 118.3 kg    Constitutional: Well-developed, well-nourished female in no acute distress.  Cardiovascular: normal rate & rhythm, warm and well-perfused Respiratory: normal effort, no problems with respiration noted GI: Abd soft, non-tender, gravid MS: Extremities nontender, no edema, normal ROM Neurologic: Alert and oriented x 4.  GU: no CVA tenderness Pelvic: No pooling, physiologic discharge, no blood, cervix clean. ( Exam chaperoned by [facility] RN)  Dilation: 2 Effacement (%): Thick Station: Ballotable Exam by:: FREDRIK Dalton, NP  Fetal Tracing: cat 1 reactive for GA Baseline: 140 Variability: moderate Accelerations: present Decelerations: absent Toco: irritability   Labs: Results for orders placed or performed during the hospital encounter of 09/13/23 (from the past 24 hours)  Urinalysis, Routine w reflex microscopic -Urine, Clean Catch     Status: Abnormal   Collection Time: 09/13/23  6:00 AM  Result Value Ref Range   Color, Urine AMBER (A) YELLOW   APPearance CLOUDY (A) CLEAR   Specific Gravity, Urine 1.017 1.005 - 1.030   pH 7.0 5.0 - 8.0   Glucose, UA NEGATIVE NEGATIVE mg/dL   Hgb urine dipstick NEGATIVE NEGATIVE   Bilirubin Urine  NEGATIVE NEGATIVE   Ketones, ur NEGATIVE NEGATIVE mg/dL   Protein, ur NEGATIVE NEGATIVE mg/dL   Nitrite NEGATIVE NEGATIVE   Leukocytes,Ua MODERATE (A) NEGATIVE   RBC / HPF 0-5 0 - 5 RBC/hpf   WBC, UA 11-20 0 - 5 WBC/hpf   Bacteria, UA RARE (A) NONE SEEN   Squamous Epithelial / HPF 6-10 0 - 5 /HPF  Wet prep, genital     Status: Abnormal   Collection Time: 09/13/23  6:50 AM  Result Value Ref Range   Yeast Wet Prep HPF POC NONE SEEN NONE SEEN   Trich, Wet Prep NONE SEEN NONE SEEN   Clue Cells Wet Prep HPF POC PRESENT (A) NONE SEEN   WBC, Wet Prep HPF POC >=10 (A) <10   Sperm NONE SEEN   Rupture of Membrane (ROM) Plus     Status: None   Collection Time: 09/13/23  6:50 AM  Result Value Ref Range   Rom Plus NEGATIVE   Fern Test     Status: None   Collection Time: 09/13/23  7:00 AM  Result Value Ref Range   POCT Fern Test Negative = intact amniotic membranes       MDM & MAU COURSE  MDM:  HIGH  Rule out PPROM Prenatal records reviewed Physical exam performed with pelvic exam Vaginal cultures obtained ( C/W BV- RX to be sent at discharge) Odetta test negative ROM plus negative NST for gestational age and fetal reassurance Pain medication administered at patient's  request x 1 ( Zofran  and oxycodone  )  No evidence of PPROM at this time  Category 1 reactive tracing, no ctx's visible on Toco or palpated  Will plan to discharge home with Tylenol  and Flexeril  for musculoskeletal pain versus Braxton Hicks contractions Will also send RX for Atarax  for anxiety     MAU Course: Orders Placed This Encounter  Procedures   Wet prep, genital   Urinalysis, Routine w reflex microscopic -Urine, Clean Catch   Rupture of Membrane (ROM) Plus   Fern Test   Discharge patient Discharge disposition: 01-Home or Self Care; Discharge patient date: 09/13/2023   Meds ordered this encounter  Medications   oxyCODONE  (Oxy IR/ROXICODONE ) immediate release tablet 10 mg    Refill:  0   ondansetron   (ZOFRAN -ODT) disintegrating tablet 8 mg   metroNIDAZOLE  (FLAGYL ) 500 MG tablet    Sig: Take 1 tablet (500 mg total) by mouth 2 (two) times daily.    Dispense:  14 tablet    Refill:  0    Supervising Provider:   PRATT, TANYA S [[id number]]   cyclobenzaprine  (FLEXERIL ) 10 MG tablet    Sig: Take 1 tablet (10 mg total) by mouth 2 (two) times daily as needed for muscle spasms.    Dispense:  20 tablet    Refill:  0    Supervising Provider:   PRATT, TANYA S [[id number]]   hydrOXYzine  (ATARAX ) 25 MG tablet    Sig: Take 1 tablet (25 mg total) by mouth every 6 (six) hours.    Dispense:  12 tablet    Refill:  0    Supervising Provider:   PRATT, TANYA S [[id number]]     I have reviewed the patient chart and performed the physical exam . I have ordered & interpreted the lab results and reviewed and interpreted the NST Medications ordered as stated below.  A/P as described below.  Counseling and education provided and patient agreeable  with plan as described below. Verbalized understanding.    ASSESSMENT   1. Bacterial vaginosis   2. False labor before 37 completed weeks of gestation in third trimester   3. Intact amniotic membranes during pregnancy in third trimester   4. Anxiety     PLAN  Discharge home in stable condition with return precautions.   See AVS for full description of information given to the patient including both verbal and written. Patient verbalized understanding and agrees with the plan as described above.  Future Appointments  Date Time Provider Department Center  09/16/2023  8:35 AM Rudy Carlin LABOR, MD [facility] None  10/07/2023  1:15 PM [facility] PROVIDER 1 [facility]  10/07/2023  1:30 PM [facility] US1 [facility]  10/21/2023  8:30 AM Tobie Arleta SQUIBB, MD [facility] None  12/30/2023 10:00 AM [facility] PHLEBOTOMIST [facility] None  12/30/2023 10:20 AM Cloretta Arley NOVAK, MD [facility] None        Follow-up Information     [facility] Follow up.    Specialty: Obstetrics and Gynecology Why: If symptoms worsen or fail to resolve, As scheduled for ongoing prenatal care Contact information: [address], [address] [place]  [address] [phone]                Allergies as of 09/13/2023       Reactions   Milk (cow) Anaphylaxis   Milk-related Compounds Anaphylaxis   Penicillins Hives, Anaphylaxis, Dermatitis   Has patient had a PCN reaction causing immediate rash, facial/tongue/throat  swelling, SOB or lightheadedness with hypotension: Yes Has patient had a PCN reaction causing severe rash involving mucus membranes or skin necrosis: No Has patient had a PCN reaction that required hospitalization: No Has patient had a PCN reaction occurring within the last 10 years: No If all of the above answers are NO, then may proceed with Cephalosporin use. Has patient had a PCN reaction causing immediate rash, facial/tongue/throat swelling, SOB or lightheadedness with hypotension: Yes Has patient had a PCN reaction causing severe rash involving mucus membranes or skin necrosis: No Has patient had a PCN reaction that required hospitalization: No Has patient had a PCN reaction occurring within the last 10 years: No If all of the above answers are NO, then may proceed with Cephalosporin use.   Latex Hives, Dermatitis   Lorazepam Other (See Comments)   PANIC   Metoclopramide  Other (See Comments)   Panic attack   Promethazine     Other Reaction(s): Fever Panic attack Other Reaction(s): Fever    Panic attack   Promethazine  Hcl    Other Reaction(s): Fever Panic attack   Phenergan  [promethazine  Hcl]    Panic attack        Medication List     TAKE these medications    acetaminophen  325 MG tablet Commonly known as: TYLENOL  Take 650 mg by mouth daily as needed for pain.   albuterol  108 (90 Base) MCG/ACT inhaler Commonly known as: VENTOLIN  HFA Inhale 2 puffs into the lungs every 6 (six) hours as needed for  wheezing or shortness of breath.   albuterol  108 (90 Base) MCG/ACT inhaler Commonly known as: VENTOLIN  HFA Inhale 1-2 puffs into the lungs every 6 (six) hours as needed for shortness of breath or wheezing.   albuterol  (2.5 MG/3ML) 0.083% nebulizer solution Commonly known as: PROVENTIL  Take 3 mLs (2.5 mg total) by nebulization every 6 (six) hours as needed for shortness of breath or wheezing.   ARIPiprazole  10 MG tablet Commonly known as: Abilify  Take 1 tablet (10 mg total) by mouth daily.   Blood Pressure Kit Devi 1 Device by Does not apply route once a week.   Breztri  Aerosphere 160-9-4.8 MCG/ACT Aero inhaler Generic drug: budesonide-glycopyrrolate-formoterol Inhale 2 puffs into the lungs in the morning and at bedtime.   calcium  carbonate 750 MG chewable tablet Commonly known as: TUMS EX Chew 1 tablet by mouth at bedtime as needed (for nausea).   cyclobenzaprine  10 MG tablet Commonly known as: FLEXERIL  Take 1 tablet (10 mg total) by mouth 2 (two) times daily as needed for muscle spasms.   enoxaparin  120 MG/0.8ML injection Commonly known as: LOVENOX  Inject 0.8 mLs (120 mg total) into the skin every 12 (twelve) hours.   hydrOXYzine  25 MG tablet Commonly known as: ATARAX  Take 1 tablet (25 mg total) by mouth every 6 (six) hours.   metroNIDAZOLE  500 MG tablet Commonly known as: FLAGYL  Take 1 tablet (500 mg total) by mouth 2 (two) times daily.   mometasone-formoterol 100-5 MCG/ACT Aero Commonly known as: DULERA Inhale 2 puffs into the lungs daily.   multivitamin with minerals tablet Take 1 tablet by mouth daily.   nicotine  14 mg/24hr patch Commonly known as: NICODERM CQ  - dosed in mg/24 hours Place 1 patch (14 mg total) onto the skin daily.   ondansetron  4 MG disintegrating tablet Commonly known as: ZOFRAN -ODT Take 1 tablet (4 mg total) by mouth every 6 (six) hours as needed for nausea.   pantoprazole  20 MG tablet Commonly known as: PROTONIX  Take 1 tablet (20 mg  total) by mouth daily.   sucralfate  1 GM/10ML suspension Commonly known as: Carafate  Take 10 mLs (1 g total) by mouth 4 (four) times daily -  with meals and at bedtime.   Vitafol  Gummies 3.33-0.333-34.8 MG Chew CHEW 1 TABLET BY MOUTH EVERY DAY       Olam Dalton, MSN, [facility], Center for [employer]

## 2023-09-13 NOTE — MAU Note (Signed)
 Pt says lost mucus plug 2 days ago. Then noticed at 1030-last night - that her underwear stays wet. Feels pelvic pressure- started at 0330., Thinks UC's - 5/10. PNC- [facility]. Last sex- weeks ago

## 2023-09-15 LAB — GC/CHLAMYDIA PROBE AMP (~~LOC~~) NOT AT ARMC
Chlamydia: NEGATIVE
Comment: NEGATIVE
Comment: NORMAL
Neisseria Gonorrhea: NEGATIVE

## 2023-09-16 ENCOUNTER — Ambulatory Visit (INDEPENDENT_AMBULATORY_CARE_PROVIDER_SITE_OTHER): Admitting: Obstetrics

## 2023-09-16 ENCOUNTER — Encounter: Payer: Self-pay | Admitting: Obstetrics

## 2023-09-16 ENCOUNTER — Telehealth: Payer: Self-pay

## 2023-09-16 VITALS — BP 119/80 | HR 97 | Wt 261.8 lb

## 2023-09-16 DIAGNOSIS — Z8751 Personal history of pre-term labor: Secondary | ICD-10-CM

## 2023-09-16 DIAGNOSIS — O0990 Supervision of high risk pregnancy, unspecified, unspecified trimester: Secondary | ICD-10-CM

## 2023-09-16 DIAGNOSIS — Z87898 Personal history of other specified conditions: Secondary | ICD-10-CM

## 2023-09-16 DIAGNOSIS — A5901 Trichomonal vulvovaginitis: Secondary | ICD-10-CM

## 2023-09-16 DIAGNOSIS — I82612 Acute embolism and thrombosis of superficial veins of left upper extremity: Secondary | ICD-10-CM | POA: Diagnosis not present

## 2023-09-16 DIAGNOSIS — F4310 Post-traumatic stress disorder, unspecified: Secondary | ICD-10-CM

## 2023-09-16 DIAGNOSIS — F319 Bipolar disorder, unspecified: Secondary | ICD-10-CM | POA: Diagnosis not present

## 2023-09-16 DIAGNOSIS — G4733 Obstructive sleep apnea (adult) (pediatric): Secondary | ICD-10-CM

## 2023-09-16 LAB — MATERNAL CELL CONTAMINATION

## 2023-09-16 NOTE — Progress Notes (Signed)
 Subjective:  Taylor Buck is a 33 y.o. H4E8877 at [redacted]w[redacted]d being seen today for ongoing prenatal care.  She is currently monitored for the following issues for this high-risk pregnancy and has History of preterm delivery; Bipolar 1 disorder (HCC); History of substance use disorder; Trichomonas vaginitis; Asthma; Family history of breast cancer; Nicotine  dependence, cigarettes, uncomplicated; Obstructive sleep apnea; PCOS (polycystic ovarian syndrome); Posttraumatic stress disorder; Supervision of high risk pregnancy, antepartum; Cystic fibrosis carrier; Pathological nipple discharge; BMI 40.0-44.9, adult (HCC); Abnormal glucose tolerance test; TIA (transient ischemic attack); Anxiety disorder affecting pregnancy, antepartum; Superficial venous thrombosis of arm, left; Plantar fasciitis, bilateral; and Nausea and vomiting in pregnancy on their problem list.  Patient reports heartburn.  Contractions: Irritability. Vag. Bleeding: None.  Movement: Present. Denies leaking of fluid.   The following portions of the patient's history were reviewed and updated as appropriate: allergies, current medications, past family history, past medical history, past social history, past surgical history and problem list. Problem list updated.  Objective:   Vitals:   09/16/23 0853  BP: 119/80  Pulse: 97  Weight: 261 lb 12.8 oz (118.8 kg)    Fetal Status: Fetal Heart Rate (bpm): 150   Movement: Present     General:  Alert, oriented and cooperative. Patient is in no acute distress.  Skin: Skin is warm and dry. No rash noted.   Cardiovascular: Normal heart rate noted  Respiratory: Normal respiratory effort, no problems with respiration noted  Abdomen: Soft, gravid, appropriate for gestational age. Pain/Pressure: Present     Pelvic:  Cervical exam deferred        Extremities: Normal range of motion.  Edema: None  Mental Status: Normal mood and affect. Normal behavior. Normal judgment and thought content.    Urinalysis:      Assessment and Plan:  Pregnancy: H4E8877 at [redacted]w[redacted]d  1. Supervision of high risk pregnancy, antepartum (Primary)  2. History of preterm delivery  3. Superficial venous thrombosis of arm, left - on Lovenox  prophylaxis  4. Bipolar 1 disorder (HCC) - clinically stable  5. History of substance use disorder - clinically stable  6. Obstructive sleep apnea - clinically stable.  Managed by PCP  7. Posttraumatic stress disorder - clinically stable.  Managed by PCP.  8. Trichomonas vaginitis, treated - repeat TOC at 36 weeks   Preterm labor symptoms and general obstetric precautions including but not limited to vaginal bleeding, contractions, leaking of fluid and fetal movement were reviewed in detail with the patient. Please refer to After Visit Summary for other counseling recommendations.   Return in about 2 weeks (around 09/30/2023) for [facility].   Rudy Carlin LABOR, MD    09/16/2023  Pt presents for rob. Pt wants her cervix checked. No other questions or concerns at this time.

## 2023-09-16 NOTE — Telephone Encounter (Signed)
 I spoke with the patient to return part of her results from amniocentesis.  Amniocentesis was performed as the patient and FOB are both carriers for cystic fibrosis. Targeted fetal testing for the maternal (c.2195del) and paternal (c.489+3A>G) variants in the CFTR gene was negative in the fetus. This significantly reduces the chance that the fetus is affected with cystic fibrosis. We reviewed that the fetus will still be screened for CF through the Newborn Screening Program.  Karyotype is pending.  Lauraine Bodily, MS, [facility] Certified Genetic Counselor [facility] [phone]

## 2023-09-17 LAB — MCC TRACKING

## 2023-09-19 ENCOUNTER — Encounter (HOSPITAL_COMMUNITY): Payer: Self-pay | Admitting: Obstetrics and Gynecology

## 2023-09-19 ENCOUNTER — Inpatient Hospital Stay (HOSPITAL_COMMUNITY)
Admission: AD | Admit: 2023-09-19 | Discharge: 2023-09-19 | Disposition: A | Discharge status: Home / Self Care | Attending: Obstetrics and Gynecology | Admitting: Obstetrics and Gynecology

## 2023-09-19 ENCOUNTER — Telehealth: Payer: Self-pay | Admitting: Internal Medicine

## 2023-09-19 DIAGNOSIS — A5901 Trichomonal vulvovaginitis: Secondary | ICD-10-CM

## 2023-09-19 DIAGNOSIS — Z3483 Encounter for supervision of other normal pregnancy, third trimester: Secondary | ICD-10-CM | POA: Diagnosis present

## 2023-09-19 DIAGNOSIS — Z3A32 32 weeks gestation of pregnancy: Secondary | ICD-10-CM | POA: Diagnosis not present

## 2023-09-19 DIAGNOSIS — O0990 Supervision of high risk pregnancy, unspecified, unspecified trimester: Secondary | ICD-10-CM

## 2023-09-19 DIAGNOSIS — Z3689 Encounter for other specified antenatal screening: Secondary | ICD-10-CM

## 2023-09-19 DIAGNOSIS — Z3493 Encounter for supervision of normal pregnancy, unspecified, third trimester: Secondary | ICD-10-CM | POA: Diagnosis not present

## 2023-09-19 LAB — WET PREP, GENITAL
Clue Cells Wet Prep HPF POC: NONE SEEN
Sperm: NONE SEEN
Trich, Wet Prep: NONE SEEN
WBC, Wet Prep HPF POC: <10 (ref <10)
Yeast Wet Prep HPF POC: NONE SEEN

## 2023-09-19 LAB — RUPTURE OF MEMBRANE (ROM)PLUS: Rom Plus: NEGATIVE

## 2023-09-19 NOTE — Telephone Encounter (Signed)
 Taylor Buck has been scheduled with Pulmonology for 8/26 at 9:00 am with Dr. Charley.

## 2023-09-19 NOTE — MAU Provider Note (Signed)
 S: Taylor Buck is a 33 y.o. [id number] at [redacted]w[redacted]d  who presents to MAU today complaining of leaking of fluid since 1900. She denies vaginal bleeding. She endorses mild cramping since the coughing fit. She reports normal fetal movement.    O: BP 109/63 (BP Location: Right Arm)   Pulse 92   Temp 98.4 F (36.9 C) (Oral)   Resp 12   Ht 5' 6 (1.676 m)   Wt 261 lb 4.8 oz (118.5 kg)   LMP 02/06/2023 (Approximate)   SpO2 97%   BMI 42.17 kg/m  GENERAL: Well-developed, well-nourished female in no acute distress.  HEAD: Normocephalic, atraumatic.  CHEST: Normal effort of breathing, regular heart rate ABDOMEN: Soft, nontender, gravid PELVIC: exam deferred, ROM+ negative  Fetal Monitoring: reactive Baseline: 148 Variability: moderate Accelerations: 15x15 Decelerations: none Contractions: none  Results for orders placed or performed during the hospital encounter of 09/19/23 (from the past 24 hours)  Rupture of Membrane (ROM) Plus     Status: None   Collection Time: 09/19/23  9:05 PM  Result Value Ref Range   Rom Plus NEGATIVE   Wet prep, genital     Status: None   Collection Time: 09/19/23  9:05 PM  Result Value Ref Range   Yeast Wet Prep HPF POC NONE SEEN NONE SEEN   Trich, Wet Prep NONE SEEN NONE SEEN   Clue Cells Wet Prep HPF POC NONE SEEN NONE SEEN   WBC, Wet Prep HPF POC <10 <10   Sperm NONE SEEN    A: SIUP at [redacted]w[redacted]d  Membranes intact NST reactive  P: Discharge home in stable condition with 3rd trimester precautions Follow up at [facility] as scheduled for ongoing prenatal care  Future Appointments  Date Time Provider Department Center  09/23/2023  9:00 AM Charley Conger, PA-C [facility]  10/03/2023  9:35 AM Wallace Joesph LABOR, PA [facility] None  10/07/2023  1:15 PM [facility] PROVIDER 1 [facility]  10/07/2023  1:30 PM [facility] US1 [facility]  10/21/2023  8:30 AM Tobie Arleta SQUIBB, MD [facility] None  12/30/2023 10:00 AM [facility] PHLEBOTOMIST [facility] None  12/30/2023  10:20 AM Cloretta Arley NOVAK, MD [facility] None    Allergies as of 09/19/2023       Reactions   Milk (cow) Anaphylaxis   Milk-related Compounds Anaphylaxis   Penicillins Hives, Anaphylaxis, Dermatitis   Has patient had a PCN reaction causing immediate rash, facial/tongue/throat swelling, SOB or lightheadedness with hypotension: Yes Has patient had a PCN reaction causing severe rash involving mucus membranes or skin necrosis: No Has patient had a PCN reaction that required hospitalization: No Has patient had a PCN reaction occurring within the last 10 years: No If all of the above answers are NO, then may proceed with Cephalosporin use. Has patient had a PCN reaction causing immediate rash, facial/tongue/throat swelling, SOB or lightheadedness with hypotension: Yes Has patient had a PCN reaction causing severe rash involving mucus membranes or skin necrosis: No Has patient had a PCN reaction that required hospitalization: No Has patient had a PCN reaction occurring within the last 10 years: No If all of the above answers are NO, then may proceed with Cephalosporin use.   Latex Hives, Dermatitis   Lorazepam Other (See Comments)   PANIC   Metoclopramide  Other (See Comments)   Panic attack   Promethazine     Other Reaction(s): Fever Panic attack Other Reaction(s): Fever    Panic attack   Promethazine  Hcl    Other Reaction(s): Fever Panic attack  Phenergan  [promethazine  Hcl]    Panic attack        Medication List     TAKE these medications    acetaminophen  325 MG tablet Commonly known as: TYLENOL  Take 650 mg by mouth daily as needed for pain.   albuterol  108 (90 Base) MCG/ACT inhaler Commonly known as: VENTOLIN  HFA Inhale 2 puffs into the lungs every 6 (six) hours as needed for wheezing or shortness of breath.   albuterol  108 (90 Base) MCG/ACT inhaler Commonly known as: VENTOLIN  HFA Inhale 1-2 puffs into the lungs every 6 (six) hours as needed for shortness of breath or  wheezing.   albuterol  (2.5 MG/3ML) 0.083% nebulizer solution Commonly known as: PROVENTIL  Take 3 mLs (2.5 mg total) by nebulization every 6 (six) hours as needed for shortness of breath or wheezing.   ARIPiprazole  10 MG tablet Commonly known as: Abilify  Take 1 tablet (10 mg total) by mouth daily.   Blood Pressure Kit Devi 1 Device by Does not apply route once a week.   Breztri  Aerosphere 160-9-4.8 MCG/ACT Aero inhaler Generic drug: budesonide-glycopyrrolate-formoterol Inhale 2 puffs into the lungs in the morning and at bedtime.   calcium  carbonate 750 MG chewable tablet Commonly known as: TUMS EX Chew 1 tablet by mouth at bedtime as needed (for nausea).   cyclobenzaprine  10 MG tablet Commonly known as: FLEXERIL  Take 1 tablet (10 mg total) by mouth 2 (two) times daily as needed for muscle spasms.   enoxaparin  120 MG/0.8ML injection Commonly known as: LOVENOX  Inject 0.8 mLs (120 mg total) into the skin every 12 (twelve) hours.   hydrOXYzine  25 MG tablet Commonly known as: ATARAX  Take 1 tablet (25 mg total) by mouth every 6 (six) hours.   metroNIDAZOLE  500 MG tablet Commonly known as: FLAGYL  Take 1 tablet (500 mg total) by mouth 2 (two) times daily.   mometasone-formoterol 100-5 MCG/ACT Aero Commonly known as: DULERA Inhale 2 puffs into the lungs daily.   multivitamin with minerals tablet Take 1 tablet by mouth daily.   nicotine  14 mg/24hr patch Commonly known as: NICODERM CQ  - dosed in mg/24 hours Place 1 patch (14 mg total) onto the skin daily.   ondansetron  4 MG disintegrating tablet Commonly known as: ZOFRAN -ODT Take 1 tablet (4 mg total) by mouth every 6 (six) hours as needed for nausea.   pantoprazole  20 MG tablet Commonly known as: PROTONIX  Take 1 tablet (20 mg total) by mouth daily.   sucralfate  1 GM/10ML suspension Commonly known as: Carafate  Take 10 mLs (1 g total) by mouth 4 (four) times daily -  with meals and at bedtime.   Vitafol  Gummies  3.33-0.333-34.8 MG Chew CHEW 1 TABLET BY MOUTH EVERY DAY        Nashawn Hillock R, [place] 09/19/2023 10:08 PM

## 2023-09-19 NOTE — MAU Note (Addendum)
 Pt says was laying in bed at 7pm- was coughing - has Asthma -used inhaler - felt wet - continued  and still feels wet . [facility] Last sex- weeks ago  Feels cramping - started at 7 pm- 3/10 Feels baby moving

## 2023-09-22 LAB — GC/CHLAMYDIA PROBE AMP (~~LOC~~) NOT AT ARMC
Chlamydia: NEGATIVE
Comment: NEGATIVE
Comment: NORMAL
Neisseria Gonorrhea: NEGATIVE

## 2023-09-22 LAB — MCC TRACKING

## 2023-09-23 ENCOUNTER — Ambulatory Visit (INDEPENDENT_AMBULATORY_CARE_PROVIDER_SITE_OTHER)

## 2023-09-23 ENCOUNTER — Encounter (HOSPITAL_BASED_OUTPATIENT_CLINIC_OR_DEPARTMENT_OTHER): Payer: Self-pay

## 2023-09-23 VITALS — BP 129/85 | HR 102 | Ht 66.0 in | Wt 258.7 lb

## 2023-09-23 DIAGNOSIS — J45909 Unspecified asthma, uncomplicated: Secondary | ICD-10-CM | POA: Diagnosis not present

## 2023-09-23 DIAGNOSIS — G4733 Obstructive sleep apnea (adult) (pediatric): Secondary | ICD-10-CM | POA: Diagnosis not present

## 2023-09-23 MED ORDER — BREZTRI AEROSPHERE 160-9-4.8 MCG/ACT IN AERO: 2.0000 | INHALATION_SPRAY | Freq: Two times a day (BID) | RESPIRATORY_TRACT | 5 refills | Status: AC

## 2023-09-23 NOTE — Progress Notes (Signed)
 @Patient  ID: Taylor Buck, female    DOB: [birth year], 33 y.o.   MRN: [id number]  Chief Complaint  Patient presents with   Establish Care    Sleep [redacted] week pregnant- used Bipap in the past    Referring provider: Tobie Arleta SQUIBB, MD  HPI: Taylor Buck is a 33 year old female with past medical history of asthma, BPD, PTSD, PCOS, and OSA previously on BIPAP who presents as a new patient for evaluation of sleep.  She is currently [redacted] weeks pregnant.  Patient reports that she was diagnosed with sleep apnea via an in-lab study at the age of 34 years old.  She was treated with BiPAP and wore this for about 3 years before she moved out of [place].  When she moved she did not bring the machine with her and stopped using it altogether.  Notably she has had a DVT in her left arm and a TIA in March 2025 and is now on Lovenox  therapy.  She sees Dr. Arleta Tobie for asthma and allergies and was referred to us  for sleep evaluation.  She continues to experience excessive daytime fatigue and requires daily naps.  She c/o brain fog and has morning headaches at times.  She denies chest pain, fever, chills, dizziness, or leg pain.  She is motivated to get her sleep apnea evaluated and treated.  TEST/EVENTS :   Allergies  Allergen Reactions   Milk (Cow) Anaphylaxis   Milk-Related Compounds Anaphylaxis   Penicillins Hives, Anaphylaxis and Dermatitis    Has patient had a PCN reaction causing immediate rash, facial/tongue/throat swelling, SOB or lightheadedness with hypotension: Yes  Has patient had a PCN reaction causing severe rash involving mucus membranes or skin necrosis: No  Has patient had a PCN reaction that required hospitalization: No  Has patient had a PCN reaction occurring within the last 10 years: No  If all of the above answers are NO, then may proceed with Cephalosporin use.  Has patient had a PCN reaction causing immediate rash, facial/tongue/throat swelling, SOB  or lightheadedness with hypotension: Yes Has patient had a PCN reaction causing severe rash involving mucus membranes or skin necrosis: No Has patient had a PCN reaction that required hospitalization: No Has patient had a PCN reaction occurring within the last 10 years: No If all of the above answers are NO, then may proceed with Cephalosporin use.   Latex Hives and Dermatitis   Lorazepam Other (See Comments)    PANIC   Metoclopramide  Other (See Comments)    Panic attack   Promethazine      Other Reaction(s): Fever  Panic attack  Other Reaction(s): Fever    Panic attack   Promethazine  Hcl     Other Reaction(s): Fever  Panic attack   Phenergan  [Promethazine  Hcl]     Panic attack    Immunization History  Administered Date(s) Administered   DTP 05/28/1990, 07/23/1990, 11/02/1990   HIB, Unspecified 05/28/1990, 07/23/1990, 11/02/1990   Hep A / Hep B 03/27/2010, 04/27/2010, 09/25/2010   Influenza,inj,Quad PF,6+ Mos 11/06/2017   Influenza,inj,quad, With Preservative 11/06/2017   Meningococcal Conjugate 09/25/2010   OPV 05/28/1990, 07/23/1990   Tdap 03/05/2018, 09/02/2023    Past Medical History:  Diagnosis Date   Anxiety    Arthritis    Asthma    inhaler laat used today   Depression    Heart murmur    Infection    chlamydia age 16   PCOS (polycystic ovarian syndrome)    Sleep apnea  TIA (transient ischemic attack) 2025   Urinary tract infection     Tobacco History: Social History   Tobacco Use  Smoking Status Former   Current packs/day: 0.00   Types: Cigarettes   Quit date: 10/2022   Years since quitting: 0.9   Passive exposure: Current  Smokeless Tobacco Former  Tobacco Comments   1 PACK A DAY - 4 CIGARETTES A DAY; nicotine  patches now   Counseling given: Not Answered Tobacco comments: 1 PACK A DAY - 4 CIGARETTES A DAY; nicotine  patches now   Outpatient Medications Prior to Visit  Medication Sig Dispense Refill   acetaminophen  (TYLENOL ) 325 MG tablet  Take 650 mg by mouth daily as needed for pain.     albuterol  (PROVENTIL ) (2.5 MG/3ML) 0.083% nebulizer solution Take 3 mLs (2.5 mg total) by nebulization every 6 (six) hours as needed for shortness of breath or wheezing. 75 mL 1   albuterol  (VENTOLIN  HFA) 108 (90 Base) MCG/ACT inhaler Inhale 2 puffs into the lungs every 6 (six) hours as needed for wheezing or shortness of breath.     ARIPiprazole  (ABILIFY ) 10 MG tablet Take 1 tablet (10 mg total) by mouth daily. 30 tablet 11   Blood Pressure Monitoring (BLOOD PRESSURE KIT) DEVI 1 Device by Does not apply route once a week. 1 each 0   calcium  carbonate (TUMS EX) 750 MG chewable tablet Chew 1 tablet by mouth at bedtime as needed (for nausea).     cyclobenzaprine  (FLEXERIL ) 10 MG tablet Take 1 tablet (10 mg total) by mouth 2 (two) times daily as needed for muscle spasms. 20 tablet 0   enoxaparin  (LOVENOX ) 120 MG/0.8ML injection Inject 0.8 mLs (120 mg total) into the skin every 12 (twelve) hours. 144 mL 1   hydrOXYzine  (ATARAX ) 25 MG tablet Take 1 tablet (25 mg total) by mouth every 6 (six) hours. 12 tablet 0   Multiple Vitamins-Minerals (MULTIVITAMIN WITH MINERALS) tablet Take 1 tablet by mouth daily.     ondansetron  (ZOFRAN -ODT) 4 MG disintegrating tablet Take 1 tablet (4 mg total) by mouth every 6 (six) hours as needed for nausea. 20 tablet 0   pantoprazole  (PROTONIX ) 20 MG tablet Take 1 tablet (20 mg total) by mouth daily. 30 tablet 1   Prenatal Vit-Fe Phos-FA-Omega (VITAFOL  GUMMIES) 3.33-0.333-34.8 MG CHEW CHEW 1 TABLET BY MOUTH EVERY DAY 90 tablet 5   sucralfate  (CARAFATE ) 1 GM/10ML suspension Take 10 mLs (1 g total) by mouth 4 (four) times daily -  with meals and at bedtime. 420 mL 0   budesonide-glycopyrrolate-formoterol (BREZTRI  AEROSPHERE) 160-9-4.8 MCG/ACT AERO inhaler Inhale 2 puffs into the lungs in the morning and at bedtime. 10.7 g 5   albuterol  (VENTOLIN  HFA) 108 (90 Base) MCG/ACT inhaler Inhale 1-2 puffs into the lungs every 6 (six) hours  as needed for shortness of breath or wheezing. (Patient not taking: Reported on 09/23/2023) 8 g 1   metroNIDAZOLE  (FLAGYL ) 500 MG tablet Take 1 tablet (500 mg total) by mouth 2 (two) times daily. (Patient not taking: Reported on 09/23/2023) 14 tablet 0   nicotine  (NICODERM CQ  - DOSED IN MG/24 HOURS) 14 mg/24hr patch Place 1 patch (14 mg total) onto the skin daily. (Patient not taking: Reported on 09/23/2023) 28 patch 4   mometasone-formoterol (DULERA) 100-5 MCG/ACT AERO Inhale 2 puffs into the lungs daily. (Patient not taking: Reported on 09/23/2023)     No facility-administered medications prior to visit.     Review of Systems: positive as mentioned in the HPI  Constitutional:  No  weight loss, night sweats,  Fevers, chills, fatigue, or  lassitude.  HEENT:   No headaches,  Difficulty swallowing,  Tooth/dental problems, or  Sore throat,                No sneezing, itching, ear ache, nasal congestion, post nasal drip,   CV:  No chest pain,  Orthopnea, PND, swelling in lower extremities, anasarca, dizziness, palpitations, syncope.   GI  No heartburn, indigestion, abdominal pain, nausea, vomiting, diarrhea, change in bowel habits, loss of appetite, bloody stools.   Resp: No shortness of breath with exertion or at rest.  No excess mucus, no productive cough,  No non-productive cough,  No coughing up of blood.  No change in color of mucus.  No wheezing.  No chest wall deformity  Skin: no rash or lesions.  GU: no dysuria, change in color of urine, no urgency or frequency.  No flank pain, no hematuria   MS:  No joint pain or swelling.  No decreased range of motion.  No back pain.    Physical Exam  BP 129/85   Pulse (!) 102   Ht 5' 6 (1.676 m)   Wt 258 lb 11.2 oz (117.3 kg)   LMP 02/06/2023 (Approximate)   SpO2 97%   BMI 41.76 kg/m   GEN: A/Ox3; pleasant , NAD, well nourished    HEENT:  Caroline/AT,  EACs-clear, TMs-wnl, NOSE-clear, THROAT-clear, no lesions, no postnasal drip or exudate  noted.   NECK:  Supple w/ fair ROM; no JVD; normal carotid impulses w/o bruits; no thyromegaly or nodules palpated; no lymphadenopathy.    RESP  Clear  P & A; w/o, wheezes/ rales/ or rhonchi. no accessory muscle use, no dullness to percussion  CARD:  RRR, no m/r/g, no peripheral edema, pulses intact, no cyanosis or clubbing.  GI:   Soft & nt; nml bowel sounds; no organomegaly or masses detected. Gravid.  Musco: Warm bil, no deformities or joint swelling noted.   Neuro: alert, no focal deficits noted.    Skin: Warm, no lesions or rashes    Lab Results:  CBC    Component Value Date/Time   WBC 11.8 (H) 09/02/2023 0814   WBC 10.7 (H) 07/03/2023 1346   RBC 4.38 09/02/2023 0814   RBC 4.53 07/03/2023 1346   HGB 11.7 09/02/2023 0814   HGB 12.0 02/04/2012 0000   HCT 36.6 09/02/2023 0814   HCT 38 02/04/2012 0000   PLT 293 09/02/2023 0814   MCV 84 09/02/2023 0814   MCH 26.7 09/02/2023 0814   MCH 26.3 07/03/2023 1346   MCHC 32.0 09/02/2023 0814   MCHC 32.1 07/03/2023 1346   RDW 14.5 09/02/2023 0814   LYMPHSABS 1.7 06/01/2023 1203   LYMPHSABS 1.5 04/17/2023 0910   MONOABS 0.4 06/01/2023 1203   EOSABS 0.1 06/01/2023 1203   EOSABS 0.1 04/17/2023 0910   BASOSABS 0.0 06/01/2023 1203   BASOSABS 0.0 04/17/2023 0910    BMET    Component Value Date/Time   NA 134 (L) 07/03/2023 1346   K 3.7 07/03/2023 1346   CL 104 07/03/2023 1346   CO2 20 (L) 07/03/2023 1346   GLUCOSE 86 07/03/2023 1346   BUN 7 07/03/2023 1346   CREATININE 0.91 07/03/2023 1346   CALCIUM  9.1 07/03/2023 1346   GFRNONAA >60 07/03/2023 1346   GFRAA >60 04/25/2018 1600    BNP No results found for: BNP  ProBNP No results found for: PROBNP  Imaging: US  MFM OB FOLLOW UP Result Date:  09/04/2023 ----------------------------------------------------------------------  OBSTETRICS REPORT                       (Signed Final 09/04/2023 11:21 pm)  ---------------------------------------------------------------------- Patient Info  ID #:       [id number]                          D.O.B.:  May 08, 1990 (33 yrs)(F)  Name:       Taylor JONETTA MALLICK-               Visit Date: 09/04/2023 08:15 am              [place] ---------------------------------------------------------------------- Performed By  Attending:        Steffan Keys MD         Ref. Address:      [address]                                                              [place], [place]                                                              [address]  Performed By:     Cosette Mor         [facility]                                   [facility]  Referred By:      [facility] ---------------------------------------------------------------------- Orders  #  Description                           Code  Ordered By  1  US  MFM OB FOLLOW UP                   [id number]    RAVI SHANKAR  2  US  MFM AMNIOCENTESIS                  23053.98    RAVI SHANKAR  3  US  MFM FETAL BPP WO NON               76819.01    RAVI [facility]     STRESS ----------------------------------------------------------------------  #  Order #                     Accession #                Episode #  1  [id number]                   [id number]                 [id number]  2  [id number]                   [id number]                 [id number]  3  [id number]                   [id number]                 [id number] ---------------------------------------------------------------------- Indications  Medical complication of pregnancy (Maternal     O26.90  heart murmur and FOB born with  cardiomyopathy)  Cystic Fibrosis (CF) Carrier, third trimester   O09.892  (FOB not yet tested)  Deep vein  thrombosis (DVT- Lovenox )             O22.30  Obesity complicating pregnancy, third           O99.212  trimester (39)  Poor obstetric history: Previous preterm        O09.219  delivery, antepartum (34w- SVD; on vaginal  progesterone )  History of Transient ischemic attack            Z86.73  Encounter for antenatal screening for           Z36.3  malformations  LR female  [redacted] weeks gestation of pregnancy                 Z3A.30 ---------------------------------------------------------------------- Vital Signs  BP:          121/62 ---------------------------------------------------------------------- Fetal Evaluation  Num Of Fetuses:          1  Fetal Heart Rate(bpm):   145  Cardiac Activity:        Observed  Presentation:            Cephalic  Placenta:                Posterior  P. Cord Insertion:       Previously seen  Amniotic Fluid  AFI FV:      Within normal limits  AFI Sum(cm)     %Tile       Largest Pocket(cm)  17.87           67          5.57  RUQ(cm)       RLQ(cm)       LUQ(cm)        LLQ(cm)  3.44  3.67          5.57           5.19 ---------------------------------------------------------------------- Biophysical Evaluation  Amniotic F.V:   Pocket => 2 cm             F. Tone:         Observed  F. Movement:    Observed                   Score:           8/8  F. Breathing:   Observed ---------------------------------------------------------------------- Biometry  BPD:        75  mm     G. Age:  30w 1d         40  %    CI:        74.74   %    70 - 86                                                          FL/HC:       19.6  %    19.2 - 21.4  HC:      275.3  mm     G. Age:  30w 1d         18  %    HC/AC:       1.10       0.99 - 1.21  AC:      250.6  mm     G. Age:  29w 2d         24  %    FL/BPD:      72.0  %    71 - 87  FL:         54  mm     G. Age:  28w 4d          7  %    FL/AC:       21.5  %    20 - 24  LV:        6.2  mm  Est. FW:    1350   gm          3 lb     15  %  ---------------------------------------------------------------------- OB History  Blood Type:   A+  Gravidity:    5         Term:   1        Prem:   1        SAB:   2  Living:       2 ---------------------------------------------------------------------- Gestational Age  LMP:           30w 0d        Date:  02/06/23                   EDD:   11/13/23  U/S Today:     29w 4d                                        EDD:   11/16/23  Best:          30w 0d     Det. By:  LMP  (  02/06/23)          EDD:   11/13/23 ---------------------------------------------------------------------- Anatomy  Ventricles:            Appears normal         Stomach:                Appears normal, left                                                                        sided  Heart:                 Appears normal         Kidneys:                Appear normal                         (4CH, axis, and                         situs)  Diaphragm:             Appears normal         Bladder:                Appears normal ---------------------------------------------------------------------- Guided Procedures  Type:   Amniocentesis  FH Pre Procedure:        145  FH Post Procedure:       159  Rh Type:                 A+  Rh Immunoglobulin:       Not required, Rh positive  Discharge Instructions:  Post-procedure instructions given  Needle Insertions:       20 gauge x 1  Vol. Withdrawn:          32 ml of clear amniotic fluid  Catheter Passes:         1 pass  Transabdominal:          Yes  Complications:  None  Comment:        Informed consent was obtained. A time-out was                  performed before the procedure. Patient tolerated the                  procedure well. We gave her post-procedure instructions. ---------------------------------------------------------------------- Comments  Taylor Buck is currently at 30 weeks and 0  days.  She has been followed due to maternal obesity and  history of a TIA and upper extremity thrombosis.   She is  currently treated with therapeutic Lovenox  120 mg twice a  day.  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  They received genetic  counseling earlier in her pregnancy and were counseled  regarding the availability of an amniocentesis for prenatal  diagnosis of cystic fibrosis.  The patient stated that she wanted the amniocentesis  performed today and she held her evening dose of Lovenox   last night in preparation for the procedure.  On today's exam, the overall EFW of 3 pounds measures at  the 15th percentile for her gestational age.  There was normal amniotic fluid noted with a total AFI of  17.87 cm.  After informed consent was obtained where the risks versus  benefits of the amniocentesis procedure were discussed, a  timeout was performed verifying the patient's identity and the  indications for the procedure.  The patient was then prepped  and draped in the usual sterile fashion.  An uncomplicated amniocentesis was performed today  obtaining 32 cc of straw-colored amniotic fluid which was  then sent off for chromosome analysis and testing to  determine if the fetus is affected with cystic fibrosis.  The patient was advised that our genetic counselor will notify  her regarding the results of the amniocentesis.  Post amniocentesis instructions were discussed.  As the patient's blood type is Rh positive, a dose of RhoGam  was not given following the procedure.  As the fetus is considered viable at her current gestational  age, the patient was advised that I recommend an NST  following the amniocentesis procedure today.  She stated that she could not stay for the NST as she had to  return her sister's car.  Therefore, a BPP performed after the amniocentesis was 8  out of 8 with vigorous fetal movements and fetal breathing  movements noted.  The patient was advised to resume Lovenox  later this  afternoon.  Due to maternal obesity, weekly fetal testing should  be  started at around 34 weeks.  She will return in 4 weeks for a BPP and growth scan.  The patient stated that all of her questions were answered  today.  A total of 30 minutes was spent counseling and coordinating  the care for this patient.  Greater than 50% of the time was  spent in direct face-to-face contact. ----------------------------------------------------------------------                  Steffan Keys, MD Electronically Signed Final Report   09/04/2023 11:21 pm ----------------------------------------------------------------------   US  MFM AMNIOCENTESIS Result Date: 09/04/2023 ----------------------------------------------------------------------  OBSTETRICS REPORT                       (Signed Final 09/04/2023 11:21 pm) ---------------------------------------------------------------------- Patient Info  ID #:       [id number]                          D.O.B.:  1990/12/06 (33 yrs)(F)  Name:       Taylor JONETTA MALLICK-               Visit Date: 09/04/2023 08:15 am              [place] ---------------------------------------------------------------------- Performed By  Attending:        Steffan Keys MD         Ref. Address:      [address]                                                              [address]  [place], [place]                                                              [address]  Performed By:     Cosette Mor         Location[facility]          [facility]                                   [facility]  Referred By:      [facility] ---------------------------------------------------------------------- Orders  #  Description                           Code        Ordered By  1  US  MFM OB FOLLOW UP                   76816.01    RAVI SHANKAR  2  US  MFM AMNIOCENTESIS                   23053.98    RAVI SHANKAR  3  US  MFM FETAL BPP WO NON               76819.01    RAVI [facility]     STRESS ----------------------------------------------------------------------  #  Order #                     Accession #                Episode #  1  [id number]                   [id number]                 [id number]  2  [id number]                   [id number]                 [id number]  3  [id number]                   [id number]                 [id number] ---------------------------------------------------------------------- Indications  Medical complication of pregnancy (Maternal  O26.90  heart murmur and FOB born with  cardiomyopathy)  Cystic Fibrosis (CF) Carrier, third trimester   O09.892  (FOB not yet tested)  Deep vein thrombosis (DVT- Lovenox )             O22.30  Obesity complicating pregnancy, third           O99.212  trimester (39)  Poor obstetric history: Previous preterm        O09.219  delivery, antepartum (34w- SVD; on vaginal  progesterone )  History of Transient ischemic attack            Z86.73  Encounter for antenatal screening for           Z36.3  malformations  LR female  [redacted] weeks gestation of pregnancy                 Z3A.30 ---------------------------------------------------------------------- Vital Signs  BP:          121/62 ---------------------------------------------------------------------- Fetal Evaluation  Num Of Fetuses:          1  Fetal Heart Rate(bpm):   145  Cardiac Activity:        Observed  Presentation:            Cephalic  Placenta:                Posterior  P. Cord Insertion:       Previously seen  Amniotic Fluid  AFI FV:      Within normal limits  AFI Sum(cm)     %Tile       Largest Pocket(cm)  17.87           67          5.57  RUQ(cm)       RLQ(cm)       LUQ(cm)        LLQ(cm)  3.44          3.67          5.57           5.19 ---------------------------------------------------------------------- Biophysical Evaluation  Amniotic F.V:   Pocket => 2 cm             F. Tone:          Observed  F. Movement:    Observed                   Score:           8/8  F. Breathing:   Observed ---------------------------------------------------------------------- Biometry  BPD:        75  mm     G. Age:  30w 1d         40  %    CI:        74.74   %    70 - 86                                                          FL/HC:       19.6  %    19.2 - 21.4  HC:      275.3  mm     G. Age:  30w 1d         18  %    HC/AC:  1.10       0.99 - 1.21  AC:      250.6  mm     G. Age:  29w 2d         24  %    FL/BPD:      72.0  %    71 - 87  FL:         54  mm     G. Age:  28w 4d          7  %    FL/AC:       21.5  %    20 - 24  LV:        6.2  mm  Est. FW:    1350   gm          3 lb     15  % ---------------------------------------------------------------------- OB History  Blood Type:   A+  Gravidity:    5         Term:   1        Prem:   1        SAB:   2  Living:       2 ---------------------------------------------------------------------- Gestational Age  LMP:           30w 0d        Date:  02/06/23                   EDD:   11/13/23  U/S Today:     29w 4d                                        EDD:   11/16/23  Best:          30w 0d     Det. By:  LMP  (02/06/23)          EDD:   11/13/23 ---------------------------------------------------------------------- Anatomy  Ventricles:            Appears normal         Stomach:                Appears normal, left                                                                        sided  Heart:                 Appears normal         Kidneys:                Appear normal                         (4CH, axis, and                         situs)  Diaphragm:             Appears normal         Bladder:  Appears normal ---------------------------------------------------------------------- Guided Procedures  Type:   Amniocentesis  FH Pre Procedure:        145  FH Post Procedure:       159  Rh Type:                 A+  Rh Immunoglobulin:       Not required, Rh  positive  Discharge Instructions:  Post-procedure instructions given  Needle Insertions:       20 gauge x 1  Vol. Withdrawn:          32 ml of clear amniotic fluid  Catheter Passes:         1 pass  Transabdominal:          Yes  Complications:  None  Comment:        Informed consent was obtained. A time-out was                  performed before the procedure. Patient tolerated the                  procedure well. We gave her post-procedure instructions. ---------------------------------------------------------------------- Comments  Taylor Buck is currently at 30 weeks and 0  days.  She has been followed due to maternal obesity and  history of a TIA and upper extremity thrombosis.  She is  currently treated with therapeutic Lovenox  120 mg twice a  day.  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  They received genetic  counseling earlier in her pregnancy and were counseled  regarding the availability of an amniocentesis for prenatal  diagnosis of cystic fibrosis.  The patient stated that she wanted the amniocentesis  performed today and she held her evening dose of Lovenox   last night in preparation for the procedure.  On today's exam, the overall EFW of 3 pounds measures at  the 15th percentile for her gestational age.  There was normal amniotic fluid noted with a total AFI of  17.87 cm.  After informed consent was obtained where the risks versus  benefits of the amniocentesis procedure were discussed, a  timeout was performed verifying the patient's identity and the  indications for the procedure.  The patient was then prepped  and draped in the usual sterile fashion.  An uncomplicated amniocentesis was performed today  obtaining 32 cc of straw-colored amniotic fluid which was  then sent off for chromosome analysis and testing to  determine if the fetus is affected with cystic fibrosis.  The patient was advised that our genetic counselor will  notify  her regarding the results of the amniocentesis.  Post amniocentesis instructions were discussed.  As the patient's blood type is Rh positive, a dose of RhoGam  was not given following the procedure.  As the fetus is considered viable at her current gestational  age, the patient was advised that I recommend an NST  following the amniocentesis procedure today.  She stated that she could not stay for the NST as she had to  return her sister's car.  Therefore, a BPP performed after the amniocentesis was 8  out of 8 with vigorous fetal movements and fetal breathing  movements noted.  The patient was advised to resume Lovenox  later this  afternoon.  Due to maternal obesity, weekly fetal testing should be  started at around 34 weeks.  She will return in 4 weeks for a BPP and growth scan.  The  patient stated that all of her questions were answered  today.  A total of 30 minutes was spent counseling and coordinating  the care for this patient.  Greater than 50% of the time was  spent in direct face-to-face contact. ----------------------------------------------------------------------                  Steffan Keys, MD Electronically Signed Final Report   09/04/2023 11:21 pm ----------------------------------------------------------------------   US  MFM FETAL BPP WO NON STRESS Result Date: 09/04/2023 ----------------------------------------------------------------------  OBSTETRICS REPORT                       (Signed Final 09/04/2023 11:21 pm) ---------------------------------------------------------------------- Patient Info  ID #:       [id number]                          D.O.B.:  [birth year] (33 yrs)(F)  Name:       Taylor JONETTA MALLICK-               Visit Date: 09/04/2023 08:15 am              [place] ---------------------------------------------------------------------- Performed By  Attending:        Steffan Keys MD         Ref. Address:      [address]                                                              [place], [place]                                                              [address]  Performed By:     Cosette Mor         [facility]                                                              [facility]  Referred By:      [facility] ---------------------------------------------------------------------- Orders  #  Description                           Code        Ordered By  1  US  MFM OB FOLLOW UP                   [id number]    RAVI SHANKAR  2  US  MFM AMNIOCENTESIS                  76946.01    RAVI SHANKAR  3  US  MFM FETAL BPP WO NON               76819.01    RAVI SHANKAR     STRESS ----------------------------------------------------------------------  #  Order #                     Accession #                Episode #  1  [id number]                   [id number]                 [id number]  2  [id number]                   [id number]                 [id number]  3  [id number]                   [id number]                 [id number] ---------------------------------------------------------------------- Indications  Medical complication of pregnancy (Maternal     O26.90  heart murmur and FOB born with  cardiomyopathy)  Cystic Fibrosis (CF) Carrier, third trimester   O09.892  (FOB not yet tested)  Deep vein thrombosis (DVT- Lovenox )             O22.30  Obesity complicating pregnancy, third           O99.212  trimester (39)  Poor obstetric history: Previous preterm        O09.219  delivery, antepartum (34w- SVD; on vaginal  progesterone )  History of Transient ischemic attack            Z86.73  Encounter for antenatal screening for           Z36.3  malformations  LR female  [redacted] weeks gestation of pregnancy                 Z3A.30  ---------------------------------------------------------------------- Vital Signs  BP:          121/62 ---------------------------------------------------------------------- Fetal Evaluation  Num Of Fetuses:          1  Fetal Heart Rate(bpm):   145  Cardiac Activity:        Observed  Presentation:            Cephalic  Placenta:                Posterior  P. Cord Insertion:       Previously seen  Amniotic Fluid  AFI FV:      Within normal limits  AFI Sum(cm)     %Tile       Largest Pocket(cm)  17.87           67          5.57  RUQ(cm)       RLQ(cm)       LUQ(cm)        LLQ(cm)  3.44          3.67          5.57           5.19 ---------------------------------------------------------------------- Biophysical Evaluation  Amniotic F.V:   Pocket => 2 cm             F. Tone:         Observed  F. Movement:    Observed                   Score:           8/8  F. Breathing:   Observed ---------------------------------------------------------------------- Biometry  BPD:        75  mm     G. Age:  30w 1d         40  %    CI:        74.74   %    70 - 86                                                          FL/HC:       19.6  %    19.2 - 21.4  HC:      275.3  mm     G. Age:  30w 1d         18  %    HC/AC:       1.10       0.99 - 1.21  AC:      250.6  mm     G. Age:  29w 2d         24  %    FL/BPD:      72.0  %    71 - 87  FL:         54  mm     G. Age:  28w 4d          7  %    FL/AC:       21.5  %    20 - 24  LV:        6.2  mm  Est. FW:    1350   gm          3 lb     15  % ---------------------------------------------------------------------- OB History  Blood Type:   A+  Gravidity:    5         Term:   1        Prem:   1        SAB:   2  Living:       2 ---------------------------------------------------------------------- Gestational Age  LMP:           30w 0d        Date:  02/06/23                   EDD:   11/13/23  U/S Today:     29w 4d  EDD:   11/16/23  Best:          minnette 0d     Det.  By:  LMP  (02/06/23)          EDD:   11/13/23 ---------------------------------------------------------------------- Anatomy  Ventricles:            Appears normal         Stomach:                Appears normal, left                                                                        sided  Heart:                 Appears normal         Kidneys:                Appear normal                         (4CH, axis, and                         situs)  Diaphragm:             Appears normal         Bladder:                Appears normal ---------------------------------------------------------------------- Guided Procedures  Type:   Amniocentesis  FH Pre Procedure:        145  FH Post Procedure:       159  Rh Type:                 A+  Rh Immunoglobulin:       Not required, Rh positive  Discharge Instructions:  Post-procedure instructions given  Needle Insertions:       20 gauge x 1  Vol. Withdrawn:          32 ml of clear amniotic fluid  Catheter Passes:         1 pass  Transabdominal:          Yes  Complications:  None  Comment:        Informed consent was obtained. A time-out was                  performed before the procedure. Patient tolerated the                  procedure well. We gave her post-procedure instructions. ---------------------------------------------------------------------- Comments  Taylor Buck is currently at 30 weeks and 0  days.  She has been followed due to maternal obesity and  history of a TIA and upper extremity thrombosis.  She is  currently treated with therapeutic Lovenox  120 mg twice a  day.  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  They received genetic  counseling earlier in her pregnancy and were counseled  regarding the availability of an amniocentesis for prenatal  diagnosis of cystic fibrosis.  The patient stated that she wanted the amniocentesis  performed today and she held  her evening dose of Lovenox   last night  in preparation for the procedure.  On today's exam, the overall EFW of 3 pounds measures at  the 15th percentile for her gestational age.  There was normal amniotic fluid noted with a total AFI of  17.87 cm.  After informed consent was obtained where the risks versus  benefits of the amniocentesis procedure were discussed, a  timeout was performed verifying the patient's identity and the  indications for the procedure.  The patient was then prepped  and draped in the usual sterile fashion.  An uncomplicated amniocentesis was performed today  obtaining 32 cc of straw-colored amniotic fluid which was  then sent off for chromosome analysis and testing to  determine if the fetus is affected with cystic fibrosis.  The patient was advised that our genetic counselor will notify  her regarding the results of the amniocentesis.  Post amniocentesis instructions were discussed.  As the patient's blood type is Rh positive, a dose of RhoGam  was not given following the procedure.  As the fetus is considered viable at her current gestational  age, the patient was advised that I recommend an NST  following the amniocentesis procedure today.  She stated that she could not stay for the NST as she had to  return her sister's car.  Therefore, a BPP performed after the amniocentesis was 8  out of 8 with vigorous fetal movements and fetal breathing  movements noted.  The patient was advised to resume Lovenox  later this  afternoon.  Due to maternal obesity, weekly fetal testing should be  started at around 34 weeks.  She will return in 4 weeks for a BPP and growth scan.  The patient stated that all of her questions were answered  today.  A total of 30 minutes was spent counseling and coordinating  the care for this patient.  Greater than 50% of the time was  spent in direct face-to-face contact. ----------------------------------------------------------------------                  Steffan Keys, MD Electronically Signed Final Report    09/04/2023 11:21 pm ----------------------------------------------------------------------    Administration History     None           No data to display          No results found for: NITRICOXIDE   Assessment & Plan:   Assessment & Plan OSA (obstructive sleep apnea) -  Given the history of sleep apnea requiring BiPAP and her comorbidities, plan for an in-lab split night study  -  Pending study results will get a new machine ordered -  Discussed sleep hygiene and added informational materials to AVS  Uncomplicated asthma, unspecified asthma severity, unspecified whether persistent -  Doing well on Breztri  but running out of samples; refills sent -  Continue Albuterol  prn; has not needed since starting Breztri    Return within 4 weeks of sleep study completion for review of results.  Candis Dandy, PA-C 09/23/2023

## 2023-09-23 NOTE — Assessment & Plan Note (Signed)
-    Doing well on Breztri  but running out of samples; refills sent -  Continue Albuterol  prn; has not needed since starting Breztri

## 2023-09-23 NOTE — Telephone Encounter (Signed)
 Spoke with patient to return partial amniocentesis results.  Karyotype was normal 46, XY. No aneuploidies were detected.  Chromosomal microarray is pending.  Lauraine Bodily, MS, [facility] Certified Genetic Counselor [facility] [phone]

## 2023-09-23 NOTE — Patient Instructions (Signed)
 Complete split night study as ordered.  Refills for Breztri  ordered to pharmacy of choice today.  Continue Albuterol  as needed.  Continue sleep hygiene; see attached sleep apnea and sleep apnea test informational packets for more information

## 2023-09-24 ENCOUNTER — Telehealth: Payer: Self-pay

## 2023-09-24 ENCOUNTER — Other Ambulatory Visit: Payer: Self-pay | Admitting: Advanced Practice Midwife

## 2023-09-24 ENCOUNTER — Other Ambulatory Visit: Payer: Self-pay

## 2023-09-24 ENCOUNTER — Encounter: Payer: Self-pay | Admitting: Physician Assistant

## 2023-09-24 MED ORDER — SUCRALFATE 1 GM/10ML PO SUSP
1.0000 g | Freq: Three times a day (TID) | ORAL | 0 refills | Status: DC
Start: 2023-09-24 — End: 2023-11-05

## 2023-09-24 NOTE — Telephone Encounter (Signed)
 Made patient split night study but she is concerned with breastfeeding/ pumping throughout the night. I told her I would have a nurse call her she has some concerns.

## 2023-09-25 ENCOUNTER — Telehealth: Payer: Self-pay | Admitting: Internal Medicine

## 2023-09-25 NOTE — Telephone Encounter (Signed)
 Patient called stating the Breztri  is not covered by insurance and is wanting to know of an alternative.

## 2023-09-26 ENCOUNTER — Encounter: Payer: Self-pay | Admitting: Emergency Medicine

## 2023-09-26 ENCOUNTER — Ambulatory Visit
Admission: EM | Admit: 2023-09-26 | Discharge: 2023-09-26 | Disposition: A | Discharge status: Home / Self Care | Attending: Internal Medicine | Admitting: Internal Medicine

## 2023-09-26 DIAGNOSIS — J029 Acute pharyngitis, unspecified: Secondary | ICD-10-CM | POA: Insufficient documentation

## 2023-09-26 DIAGNOSIS — R52 Pain, unspecified: Secondary | ICD-10-CM | POA: Diagnosis present

## 2023-09-26 DIAGNOSIS — B370 Candidal stomatitis: Secondary | ICD-10-CM | POA: Insufficient documentation

## 2023-09-26 DIAGNOSIS — O219 Vomiting of pregnancy, unspecified: Secondary | ICD-10-CM | POA: Insufficient documentation

## 2023-09-26 LAB — CHROMOSOME MICROARRAY REFLEX, AMN FLD

## 2023-09-26 LAB — CHROMOSOME ANALYSIS W REFLEX TO SNP, AMNIOTIC
Cells Analyzed: 15
Cells Counted: 15
Cells Karyotyped: 2
Colonies: 15
GTG Band Resolution Achieved: 400

## 2023-09-26 LAB — TARGETED VARIANT ANALYSIS

## 2023-09-26 LAB — MCC, CONTROL

## 2023-09-26 LAB — TARGETED VARIANT, FETAL

## 2023-09-26 LAB — POC SOFIA SARS ANTIGEN FIA: SARS Coronavirus 2 Ag: NEGATIVE

## 2023-09-26 LAB — POCT RAPID STREP A (OFFICE): Rapid Strep A Screen: NEGATIVE

## 2023-09-26 LAB — MATERNAL CELL CONTAMINATION

## 2023-09-26 MED ORDER — NYSTATIN 100000 UNIT/ML MT SUSP: 500000.0000 [IU] | Freq: Four times a day (QID) | OROMUCOSAL | 0 refills | Status: DC

## 2023-09-26 NOTE — ED Triage Notes (Signed)
 Pt states she developed sore throat, body aches 3 days ago.  Pt is also [redacted] weeks pregnant and is experiencing vomiting and nausea.

## 2023-09-26 NOTE — ED Provider Notes (Signed)
 [facility]    CSN: [id number] Arrival date & time: 09/26/23  1846      History   Chief Complaint Chief Complaint  Patient presents with   Sore Throat    HPI Taylor Buck is a 33 y.o. female.   33 year old female who presents urgent care with complaints of sore throat, nausea, vomiting and bodyaches.  She reports her symptoms started about 3 days ago.  She reports that her throat feels so sore that it feels like razor blades when she swallows.  She has been unable to keep much of anything down in the last 3 days and has been vomiting so much that it feels like it is burning her throat.  She does relate that she is having some pain in the mouth as well.  She is [redacted] weeks pregnant and reports she has felt some increasing contractions since she has been very dehydrated.  She denies any known fevers or chills.  She denies any known sick contacts.  Both of her children woke up with similar symptoms this morning.   Sore Throat Pertinent negatives include no chest pain, no abdominal pain and no shortness of breath.    Past Medical History:  Diagnosis Date   Anxiety    Arthritis    Asthma    inhaler laat used today   Depression    Heart murmur    Infection    chlamydia age 1   PCOS (polycystic ovarian syndrome)    Sleep apnea    TIA (transient ischemic attack) 2025   Urinary tract infection     Patient Active Problem List   Diagnosis Date Noted   Nausea and vomiting in pregnancy 08/26/2023   Plantar fasciitis, bilateral 07/10/2023   Superficial venous thrombosis of arm, left 06/26/2023   Anxiety disorder affecting pregnancy, antepartum 06/05/2023   TIA (transient ischemic attack) 06/01/2023   BMI 40.0-44.9, adult (HCC) 05/29/2023   Abnormal glucose tolerance test 05/29/2023   Pathological nipple discharge 05/27/2023   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   Supervision of high risk pregnancy, antepartum 03/28/2023   Trichomonas vaginitis  03/18/2023   PCOS (polycystic ovarian syndrome) 10/11/2022   Asthma 10/03/2022   Family history of breast cancer 08/23/2021   Nicotine  dependence, cigarettes, uncomplicated 08/23/2021   Obstructive sleep apnea 08/23/2021   Posttraumatic stress disorder 08/23/2021   History of substance use disorder 03/05/2018   History of preterm delivery 12/10/2017   Bipolar 1 disorder (HCC) 12/10/2017    Past Surgical History:  Procedure Laterality Date   CHOLECYSTECTOMY     HAND SURGERY     reconstruction- injury due to frostbite as child   HERNIA REPAIR     rt inguinal    OB History     Gravida  5   Para  2   Term  1   Preterm  1   AB  2   Living  2      SAB  2   IAB  0   Ectopic  0   Multiple  0   Live Births  2            Home Medications    Prior to Admission medications   Medication Sig Start Date End Date Taking? Authorizing Provider  nystatin  (MYCOSTATIN ) 100000 UNIT/ML suspension Use as directed 5 mLs (500,000 Units total) in the mouth or throat 4 (four) times daily. Swish, gargle and spit 09/26/23  Yes Britteney Ayotte, Almarie LABOR, PA-C  acetaminophen  (TYLENOL ) 325 MG  tablet Take 650 mg by mouth daily as needed for pain.    [provider]  albuterol  (PROVENTIL ) (2.5 MG/3ML) 0.083% nebulizer solution Take 3 mLs (2.5 mg total) by nebulization every 6 (six) hours as needed for shortness of breath or wheezing. 09/09/23   Tobie Arleta SQUIBB, MD  albuterol  (VENTOLIN  HFA) 108 (90 Base) MCG/ACT inhaler Inhale 2 puffs into the lungs every 6 (six) hours as needed for wheezing or shortness of breath.    [provider]  albuterol  (VENTOLIN  HFA) 108 (90 Base) MCG/ACT inhaler Inhale 1-2 puffs into the lungs every 6 (six) hours as needed for shortness of breath or wheezing. Patient not taking: Reported on 09/23/2023 09/09/23   Tobie Arleta SQUIBB, MD  ARIPiprazole  (ABILIFY ) 10 MG tablet Take 1 tablet (10 mg total) by mouth daily. 05/29/23   Erik Kieth BROCKS, MD  Blood  Pressure Monitoring (BLOOD PRESSURE KIT) DEVI 1 Device by Does not apply route once a week. 03/28/23   Constant, Peggy, MD  budesonide-glycopyrrolate-formoterol (BREZTRI  AEROSPHERE) 160-9-4.8 MCG/ACT AERO inhaler Inhale 2 puffs into the lungs in the morning and at bedtime. 09/23/23   Charley Conger, PA-C  calcium  carbonate (TUMS EX) 750 MG chewable tablet Chew 1 tablet by mouth at bedtime as needed (for nausea).    [provider]  cyclobenzaprine  (FLEXERIL ) 10 MG tablet Take 1 tablet (10 mg total) by mouth 2 (two) times daily as needed for muscle spasms. 09/13/23   Cooleen, Olam LABOR, NP  enoxaparin  (LOVENOX ) 120 MG/0.8ML injection Inject 0.8 mLs (120 mg total) into the skin every 12 (twelve) hours. 08/14/23 02/10/24  Nicholaus Almarie HERO, MD  hydrOXYzine  (ATARAX ) 25 MG tablet Take 1 tablet (25 mg total) by mouth every 6 (six) hours. 09/13/23   Cooleen, Olam LABOR, NP  metroNIDAZOLE  (FLAGYL ) 500 MG tablet Take 1 tablet (500 mg total) by mouth 2 (two) times daily. Patient not taking: Reported on 09/23/2023 09/13/23   Littie Olam LABOR, NP  Multiple Vitamins-Minerals (MULTIVITAMIN WITH MINERALS) tablet Take 1 tablet by mouth daily.    [provider]  nicotine  (NICODERM CQ  - DOSED IN MG/24 HOURS) 14 mg/24hr patch Place 1 patch (14 mg total) onto the skin daily. Patient not taking: Reported on 09/23/2023 09/02/23   Zina Jerilynn LABOR, MD  ondansetron  (ZOFRAN -ODT) 4 MG disintegrating tablet Take 1 tablet (4 mg total) by mouth every 6 (six) hours as needed for nausea. 06/18/23   Trudy Earnie CROME, CNM  pantoprazole  (PROTONIX ) 20 MG tablet Take 1 tablet (20 mg total) by mouth daily. 06/18/23   Trudy Earnie CROME, CNM  Prenatal Vit-Fe Phos-FA-Omega (VITAFOL  GUMMIES) 3.33-0.333-34.8 MG CHEW CHEW 1 TABLET BY MOUTH EVERY DAY 03/11/23   [name], Norleen GAILS, MD  sucralfate  (CARAFATE ) 1 GM/10ML suspension Take 10 mLs (1 g total) by mouth 4 (four) times daily -  with meals and at bedtime. 09/24/23   Nicholaus Jorene BRAVO, PA-C     Family History Family History  Problem Relation Age of Onset   Asthma Mother    COPD Mother    Diabetes Maternal Aunt    Cancer Maternal Aunt        breast   Diabetes Maternal Grandmother    Cancer Maternal Grandmother        skin cancer   Alcoholism Other    Arthritis Other    Asthma Other    Depression Other    Hearing loss Neg Hx     Social History Social History   Tobacco Use  Smoking status: Former    Current packs/day: 0.00    Types: Cigarettes    Quit date: 10/2022    Years since quitting: 0.9    Passive exposure: Current   Smokeless tobacco: Former   Tobacco comments:    1 PACK A DAY - 4 CIGARETTES A DAY; nicotine  patches now  Vaping Use   Vaping status: Never Used  Substance Use Topics   Alcohol use: No   Drug use: Not Currently    Types: Marijuana    Comment: UP UNTIL +UPT     Allergies   Milk (cow), Milk-related compounds, Penicillins, Latex, Lorazepam, Metoclopramide , Promethazine , Promethazine  hcl, and Phenergan  [promethazine  hcl]   Review of Systems Review of Systems  Constitutional:  Positive for fatigue. Negative for chills and fever.  HENT:  Positive for sore throat and trouble swallowing. Negative for ear pain.   Eyes:  Negative for pain and visual disturbance.  Respiratory:  Negative for cough and shortness of breath.   Cardiovascular:  Negative for chest pain and palpitations.  Gastrointestinal:  Positive for nausea and vomiting. Negative for abdominal pain.       Mild increase in contractions  Genitourinary:  Negative for dysuria and hematuria.  Musculoskeletal:  Positive for myalgias. Negative for arthralgias and back pain.  Skin:  Negative for color change and rash.  Neurological:  Negative for seizures and syncope.  All other systems reviewed and are negative.    Physical Exam Triage Vital Signs ED Triage Vitals  Encounter Vitals Group     BP 09/26/23 1908 119/81     Girls Systolic BP Percentile --      Girls Diastolic  BP Percentile --      Boys Systolic BP Percentile --      Boys Diastolic BP Percentile --      Pulse Rate 09/26/23 1908 97     Resp 09/26/23 1908 16     Temp 09/26/23 1908 98.3 F (36.8 C)     Temp Source 09/26/23 1908 Oral     SpO2 09/26/23 1908 93 %     Weight --      Height --      Head Circumference --      Peak Flow --      Pain Score 09/26/23 1903 0     Pain Loc --      Pain Education --      Exclude from Growth Chart --    No data found.  Updated Vital Signs BP 119/81 (BP Location: Right Arm)   Pulse 97   Temp 98.3 F (36.8 C) (Oral)   Resp 16   LMP 02/06/2023 (Approximate)   SpO2 93%   Visual Acuity Right Eye Distance:   Left Eye Distance:   Bilateral Distance:    Right Eye Near:   Left Eye Near:    Bilateral Near:     Physical Exam Vitals and nursing note reviewed.  Constitutional:      General: She is not in acute distress.    Appearance: She is well-developed.  HENT:     Head: Normocephalic and atraumatic.     Mouth/Throat:     Mouth: Mucous membranes are moist.     Pharynx: Pharyngeal swelling and posterior oropharyngeal erythema present.   Eyes:     Conjunctiva/sclera: Conjunctivae normal.  Cardiovascular:     Rate and Rhythm: Normal rate and regular rhythm.     Heart sounds: No murmur heard. Pulmonary:     Effort: Pulmonary  effort is normal. No respiratory distress.     Breath sounds: Normal breath sounds.  Abdominal:     Palpations: Abdomen is soft.     Tenderness: There is no abdominal tenderness.  Musculoskeletal:        General: No swelling.     Cervical back: Neck supple.  Skin:    General: Skin is warm and dry.     Capillary Refill: Capillary refill takes less than 2 seconds.  Neurological:     Mental Status: She is alert.  Psychiatric:        Mood and Affect: Mood normal.      UC Treatments / Results  Labs (all labs ordered are listed, but only abnormal results are displayed) Labs Reviewed  POC SOFIA SARS ANTIGEN FIA -  Normal  CULTURE, GROUP A STREP [facility])  POCT RAPID STREP A (OFFICE)    EKG   Radiology No results found.  Procedures Procedures (including critical care time)  Medications Ordered in UC Medications - No data to display  Initial Impression / Assessment and Plan / UC Course  I have reviewed the triage vital signs and the nursing notes.  Pertinent labs & imaging results that were available during my care of the patient were reviewed by me and considered in my medical decision making (see chart for details).     Sore throat - Plan: Culture, group A strep, Culture, group A strep  Nausea/vomiting in pregnancy  Generalized body aches  Oral thrush   Strep testing done today was negative.  COVID testing done today was negative.  Physical exam findings does show a significantly erythematous throat and swelling but there is also whitish discoloration of the tongue that could be thrush and we have called in nystatin  5 mL 4 times daily swish, gargle and spit out.  Due to the physical exam findings we are going to send the strep off for a culture to verify if strep is present and if it is sensitive.  Due to the severe nausea and vomiting and inability to stay hydrated we do recommend going to MAU for further evaluation.  Final Clinical Impressions(s) / UC Diagnoses   Final diagnoses:  Sore throat  Nausea/vomiting in pregnancy  Generalized body aches  Oral thrush     Discharge Instructions      Strep testing done today was negative.  COVID testing done today was negative.  Physical exam findings does show a significantly erythematous throat and swelling but there is also whitish discoloration of the tongue that could be thrush and we have called in nystatin  5 mL 4 times daily swish, gargle and spit out.  Due to the physical exam findings we are going to send the strep off for a culture to verify if strep is present and if it is sensitive.  Due to the severe nausea and vomiting and  inability to stay hydrated we do recommend going to MAU for further evaluation.    ED Prescriptions     Medication Sig Dispense Auth. Provider   nystatin  (MYCOSTATIN ) 100000 UNIT/ML suspension Use as directed 5 mLs (500,000 Units total) in the mouth or throat 4 (four) times daily. Swish, gargle and spit 60 mL Teresa Almarie LABOR, [place]      PDMP not reviewed this encounter.   Teresa Almarie LABOR DEVONNA 09/26/23 2008

## 2023-09-26 NOTE — Discharge Instructions (Addendum)
 Strep testing done today was negative.  COVID testing done today was negative.  Physical exam findings does show a significantly erythematous throat and swelling but there is also whitish discoloration of the tongue that could be thrush and we have called in nystatin  5 mL 4 times daily swish, gargle and spit out.  Due to the physical exam findings we are going to send the strep off for a culture to verify if strep is present and if it is sensitive.  Due to the severe nausea and vomiting and inability to stay hydrated we do recommend going to MAU for further evaluation.

## 2023-09-29 ENCOUNTER — Encounter (HOSPITAL_COMMUNITY): Payer: Self-pay | Admitting: Obstetrics & Gynecology

## 2023-09-29 ENCOUNTER — Inpatient Hospital Stay (HOSPITAL_COMMUNITY)
Admission: AD | Admit: 2023-09-29 | Discharge: 2023-09-29 | Discharge status: Against Medical Advice | Attending: Obstetrics & Gynecology | Admitting: Obstetrics & Gynecology

## 2023-09-29 DIAGNOSIS — A5901 Trichomonal vulvovaginitis: Secondary | ICD-10-CM

## 2023-09-29 DIAGNOSIS — Z5321 Procedure and treatment not carried out due to patient leaving prior to being seen by health care provider: Secondary | ICD-10-CM | POA: Diagnosis present

## 2023-09-29 DIAGNOSIS — O0990 Supervision of high risk pregnancy, unspecified, unspecified trimester: Secondary | ICD-10-CM

## 2023-09-29 LAB — URINALYSIS, ROUTINE W REFLEX MICROSCOPIC
Bacteria, UA: NONE SEEN
Glucose, UA: NEGATIVE mg/dL
Hgb urine dipstick: NEGATIVE
Ketones, ur: NEGATIVE mg/dL
Nitrite: NEGATIVE
Protein, ur: 100 mg/dL — AB
Specific Gravity, Urine: 1.027 (ref 1.005–1.030)
pH: 8 (ref 5.0–8.0)

## 2023-09-29 LAB — CULTURE, GROUP A STREP (THRC)

## 2023-09-29 MED ORDER — ARIPIPRAZOLE 10 MG PO TABS
10.0000 mg | ORAL_TABLET | Freq: Once | ORAL | Status: DC
  Filled 2023-09-29: qty 1

## 2023-09-29 NOTE — MAU Note (Signed)
 Patient requesting to leave. RN brought AMA paperwork to bedside, patient signed and has no questions.

## 2023-09-29 NOTE — MAU Note (Signed)
 Pt says she has cramping in her back and pelvis - 7/10-  all started at 8pm. VE 2 weeks ago - 2 cm  Last sex- long time

## 2023-09-30 ENCOUNTER — Telehealth: Payer: Self-pay

## 2023-09-30 ENCOUNTER — Ambulatory Visit (HOSPITAL_COMMUNITY): Payer: Self-pay

## 2023-09-30 NOTE — Telephone Encounter (Signed)
 Spoke with patient to return final results from amniocentesis.  Chromosomal microarray was negative arr(X,Y)x1,(1-22)x2. No copy number variants were detected.  Prior results: Karyotype was normal 46, XY. No aneuploidies were detected. Targeted fetal testing for the maternal (c.2195del) and paternal (c.489+3A>G) variants in the CFTR gene was negative in the fetus. This significantly reduces the chance that the fetus is affected with cystic fibrosis. We reviewed that the fetus will still be screened for CF through the Newborn Screening Program.   Lauraine Bodily, MS, [facility] Certified Genetic Counselor [facility] [phone]

## 2023-09-30 NOTE — MAU Provider Note (Signed)
 Patient left AMA prior to being seen by a provider. She told nursing when they went to give her home anxiety medication that she felt better and did not need to be seen.  Taylor Angles, MD OB Fellow, Faculty Practice [facility], [facility] [employer]

## 2023-09-30 NOTE — Telephone Encounter (Signed)
 VM received from patient on [facility] triage line. States that she left the ER AMA over the weekend, after overhearing hospital staff saying mean things about her. Pt states that she was crying and embarrassed. She states she had a urinalysis done while there and is inquiring about the results. RN will send message to provider to inquire if treatment is needed based on results.

## 2023-09-30 NOTE — Telephone Encounter (Signed)
 No please advise on breastfeeding/pumping process as will have to be awake for that part of study

## 2023-10-01 ENCOUNTER — Ambulatory Visit
Admission: EM | Admit: 2023-10-01 | Discharge: 2023-10-01 | Disposition: A | Discharge status: Home / Self Care | Attending: Emergency Medicine | Admitting: Emergency Medicine

## 2023-10-01 ENCOUNTER — Encounter: Payer: Self-pay | Admitting: Emergency Medicine

## 2023-10-01 DIAGNOSIS — B370 Candidal stomatitis: Secondary | ICD-10-CM | POA: Diagnosis not present

## 2023-10-01 MED ORDER — CLOTRIMAZOLE 10 MG MT TROC
10.0000 mg | Freq: Every day | OROMUCOSAL | 0 refills | Status: AC
Start: 2023-10-01 — End: 2023-10-15

## 2023-10-01 NOTE — Telephone Encounter (Signed)
 Pt states she has reached out to her insurance and they will provide her a wearable pump and she will take a cooler bag NFN

## 2023-10-01 NOTE — ED Triage Notes (Addendum)
 Pt has had sore throat that feels like razor blades since 8/26. She was seen on 8/29 and strep and covid was negative.  She recently developed diarrhea yesterday

## 2023-10-01 NOTE — ED Provider Notes (Signed)
 [facility]    CSN: [id number] Arrival date & time: 10/01/23  1815      History   Chief Complaint Chief Complaint  Patient presents with   Sore Throat    HPI Taylor Buck is a 33 y.o. female.   Patient presents to clinic with her 2 children.  Patient reports she feels like she is swallowing denies and does not feel like her oral thrush has completely resolved.  Was seen on 8/29 here in clinic and had negative strep testing.  She was diagnosed with oral thrush and started on nystatin  suspension.  Reports recently finishing antibiotics within the last 2 weeks, Flagyl .  Also was recently started on a new inhaler and reports she did not know she was post to rinse her mouth out after using.  Does have upper partials.  She is 33 weeks and 6 days gestation.  The history is provided by the patient and medical records.  Sore Throat    Past Medical History:  Diagnosis Date   Anxiety    Arthritis    Asthma    inhaler laat used today   Depression    Heart murmur    Infection    chlamydia age 23   PCOS (polycystic ovarian syndrome)    Sleep apnea    TIA (transient ischemic attack) 2025   Urinary tract infection     Patient Active Problem List   Diagnosis Date Noted   Nausea and vomiting in pregnancy 08/26/2023   Plantar fasciitis, bilateral 07/10/2023   Superficial venous thrombosis of arm, left 06/26/2023   Anxiety disorder affecting pregnancy, antepartum 06/05/2023   TIA (transient ischemic attack) 06/01/2023   BMI 40.0-44.9, adult (HCC) 05/29/2023   Abnormal glucose tolerance test 05/29/2023   Pathological nipple discharge 05/27/2023   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   Supervision of high risk pregnancy, antepartum 03/28/2023   Trichomonas vaginitis 03/18/2023   PCOS (polycystic ovarian syndrome) 10/11/2022   Asthma 10/03/2022   Family history of breast cancer 08/23/2021   Nicotine  dependence, cigarettes, uncomplicated 08/23/2021    Obstructive sleep apnea 08/23/2021   Posttraumatic stress disorder 08/23/2021   History of substance use disorder 03/05/2018   History of preterm delivery 12/10/2017   Bipolar 1 disorder (HCC) 12/10/2017    Past Surgical History:  Procedure Laterality Date   CHOLECYSTECTOMY     HAND SURGERY     reconstruction- injury due to frostbite as child   HERNIA REPAIR     rt inguinal    OB History     Gravida  5   Para  2   Term  1   Preterm  1   AB  2   Living  2      SAB  2   IAB  0   Ectopic  0   Multiple  0   Live Births  2            Home Medications    Prior to Admission medications   Medication Sig Start Date End Date Taking? Authorizing Provider  clotrimazole  (MYCELEX ) 10 MG troche Take 1 tablet (10 mg total) by mouth 5 (five) times daily for 14 days. 10/01/23 10/15/23 Yes Belia Febo  N, FNP  acetaminophen  (TYLENOL ) 325 MG tablet Take 650 mg by mouth daily as needed for pain.    [provider]  albuterol  (PROVENTIL ) (2.5 MG/3ML) 0.083% nebulizer solution Take 3 mLs (2.5 mg total) by nebulization every 6 (six) hours as needed for shortness of  breath or wheezing. 09/09/23   Tobie Arleta SQUIBB, MD  albuterol  (VENTOLIN  HFA) 108 (90 Base) MCG/ACT inhaler Inhale 2 puffs into the lungs every 6 (six) hours as needed for wheezing or shortness of breath.    [provider]  albuterol  (VENTOLIN  HFA) 108 (90 Base) MCG/ACT inhaler Inhale 1-2 puffs into the lungs every 6 (six) hours as needed for shortness of breath or wheezing. Patient not taking: Reported on 09/23/2023 09/09/23   Tobie Arleta SQUIBB, MD  ARIPiprazole  (ABILIFY ) 10 MG tablet Take 1 tablet (10 mg total) by mouth daily. 05/29/23   Erik Kieth BROCKS, MD  Blood Pressure Monitoring (BLOOD PRESSURE KIT) DEVI 1 Device by Does not apply route once a week. 03/28/23   Constant, Peggy, MD  budesonide-glycopyrrolate-formoterol (BREZTRI  AEROSPHERE) 160-9-4.8 MCG/ACT AERO inhaler Inhale 2 puffs into the lungs  in the morning and at bedtime. 09/23/23   Charley Conger, PA-C  calcium  carbonate (TUMS EX) 750 MG chewable tablet Chew 1 tablet by mouth at bedtime as needed (for nausea).    [provider]  cyclobenzaprine  (FLEXERIL ) 10 MG tablet Take 1 tablet (10 mg total) by mouth 2 (two) times daily as needed for muscle spasms. 09/13/23   Cooleen, Olam LABOR, NP  enoxaparin  (LOVENOX ) 120 MG/0.8ML injection Inject 0.8 mLs (120 mg total) into the skin every 12 (twelve) hours. 08/14/23 02/10/24  Nicholaus Almarie HERO, MD  hydrOXYzine  (ATARAX ) 25 MG tablet Take 1 tablet (25 mg total) by mouth every 6 (six) hours. 09/13/23   Cooleen, Olam LABOR, NP  metroNIDAZOLE  (FLAGYL ) 500 MG tablet Take 1 tablet (500 mg total) by mouth 2 (two) times daily. Patient not taking: Reported on 09/23/2023 09/13/23   Littie Olam LABOR, NP  Multiple Vitamins-Minerals (MULTIVITAMIN WITH MINERALS) tablet Take 1 tablet by mouth daily.    [provider]  nicotine  (NICODERM CQ  - DOSED IN MG/24 HOURS) 14 mg/24hr patch Place 1 patch (14 mg total) onto the skin daily. Patient not taking: Reported on 09/23/2023 09/02/23   Zina Jerilynn LABOR, MD  nystatin  (MYCOSTATIN ) 100000 UNIT/ML suspension Use as directed 5 mLs (500,000 Units total) in the mouth or throat 4 (four) times daily. Swish, gargle and spit 09/26/23   Teresa Almarie A, PA-C  ondansetron  (ZOFRAN -ODT) 4 MG disintegrating tablet Take 1 tablet (4 mg total) by mouth every 6 (six) hours as needed for nausea. 06/18/23   Trudy Earnie CROME, CNM  pantoprazole  (PROTONIX ) 20 MG tablet Take 1 tablet (20 mg total) by mouth daily. 06/18/23   Trudy Earnie CROME, CNM  Prenatal Vit-Fe Phos-FA-Omega (VITAFOL  GUMMIES) 3.33-0.333-34.8 MG CHEW CHEW 1 TABLET BY MOUTH EVERY DAY 03/11/23   [name], Norleen GAILS, MD  sucralfate  (CARAFATE ) 1 GM/10ML suspension Take 10 mLs (1 g total) by mouth 4 (four) times daily -  with meals and at bedtime. 09/24/23   Nicholaus Jorene BRAVO, PA-C    Family History Family History  Problem Relation  Age of Onset   Asthma Mother    COPD Mother    Diabetes Maternal Aunt    Cancer Maternal Aunt        breast   Diabetes Maternal Grandmother    Cancer Maternal Grandmother        skin cancer   Alcoholism Other    Arthritis Other    Asthma Other    Depression Other    Hearing loss Neg Hx     Social History Social History   Tobacco Use   Smoking status: Former    Current packs/day:  0.00    Types: Cigarettes    Quit date: 10/2022    Years since quitting: 0.9    Passive exposure: Current   Smokeless tobacco: Former   Tobacco comments:    1 PACK A DAY - 4 CIGARETTES A DAY; nicotine  patches now  Vaping Use   Vaping status: Never Used  Substance Use Topics   Alcohol use: No   Drug use: Not Currently    Types: Marijuana    Comment: UP UNTIL +UPT     Allergies   Milk (cow), Milk-related compounds, Penicillins, Latex, Lorazepam, Metoclopramide , Promethazine , Promethazine  hcl, and Phenergan  [promethazine  hcl]   Review of Systems Review of Systems  Per HPI  Physical Exam Triage Vital Signs ED Triage Vitals  Encounter Vitals Group     BP 10/01/23 1848 115/78     Girls Systolic BP Percentile --      Girls Diastolic BP Percentile --      Boys Systolic BP Percentile --      Boys Diastolic BP Percentile --      Pulse Rate 10/01/23 1848 (!) 102     Resp 10/01/23 1848 16     Temp 10/01/23 1848 98.4 F (36.9 C)     Temp Source 10/01/23 1848 Oral     SpO2 10/01/23 1848 94 %     Weight --      Height --      Head Circumference --      Peak Flow --      Pain Score 10/01/23 1836 0     Pain Loc --      Pain Education --      Exclude from Growth Chart --    No data found.  Updated Vital Signs BP 115/78 (BP Location: Right Arm)   Pulse (!) 102   Temp 98.4 F (36.9 C) (Oral)   Resp 16   LMP 02/06/2023 (Approximate)   SpO2 94%   Visual Acuity Right Eye Distance:   Left Eye Distance:   Bilateral Distance:    Right Eye Near:   Left Eye Near:    Bilateral  Near:     Physical Exam Vitals and nursing note reviewed.  Constitutional:      Appearance: Normal appearance. She is well-developed.  HENT:     Head: Normocephalic and atraumatic.     Right Ear: External ear normal.     Left Ear: External ear normal.     Nose: No congestion or rhinorrhea.     Mouth/Throat:     Mouth: Mucous membranes are moist.     Tongue: Lesions present.     Pharynx: Uvula midline.     Tonsils: No tonsillar exudate or tonsillar abscesses. 0 on the right. 0 on the left.  Eyes:     Conjunctiva/sclera: Conjunctivae normal.  Cardiovascular:     Rate and Rhythm: Normal rate and regular rhythm.     Heart sounds: Normal heart sounds. No murmur heard. Pulmonary:     Effort: Pulmonary effort is normal. No respiratory distress.     Breath sounds: Normal breath sounds. No wheezing.  Skin:    General: Skin is warm and dry.  Neurological:     General: No focal deficit present.     Mental Status: She is alert and oriented to person, place, and time.  Psychiatric:        Mood and Affect: Mood normal.        Behavior: Behavior normal. Behavior is cooperative.  UC Treatments / Results  Labs (all labs ordered are listed, but only abnormal results are displayed) Labs Reviewed - No data to display  EKG   Radiology No results found.  Procedures Procedures (including critical care time)  Medications Ordered in UC Medications - No data to display  Initial Impression / Assessment and Plan / UC Course  I have reviewed the triage vital signs and the nursing notes.  Pertinent labs & imaging results that were available during my care of the patient were reviewed by me and considered in my medical decision making (see chart for details).  Vitals and triage reviewed, patient is hemodynamically stable.  Lungs vesicular, heart with regular rate and rhythm.  Does have scattered white patches across her tongue consistent with oral thrush, recently failed nystatin .   Recent oral antibiotic use and recent improper inhaler use.  Does have upper partials as well.  Will trial clotrimazole  troches to see if we can avoid systemic therapy during pregnancy.  Encouraged OB/GYN/PCP follow-up if symptoms persist.  Plan of care, follow-up care return precautions given, no questions at this time.     Final Clinical Impressions(s) / UC Diagnoses   Final diagnoses:  Oral thrush     Discharge Instructions      Your symptoms are consistent with oral thrush.  Use the clotrimazole  lozenges and let them dissolve orally 5 times daily for the next 14 days.  Please seek follow-up care with your OB/GYN or primary care provider if these do not fully resolve your symptoms.  Ensure you are rinsing your mouth out after use your inhaler and using denture cleaners for any partials and performing good oral care.  Return to clinic for any new or urgent symptoms.    ED Prescriptions     Medication Sig Dispense Auth. Provider   clotrimazole  (MYCELEX ) 10 MG troche Take 1 tablet (10 mg total) by mouth 5 (five) times daily for 14 days. 70 tablet Dreama, Shequila Neglia  N, FNP      PDMP not reviewed this encounter.   Dreama Lauri SAILOR, FNP 10/01/23 1914

## 2023-10-01 NOTE — Discharge Instructions (Signed)
 Your symptoms are consistent with oral thrush.  Use the clotrimazole  lozenges and let them dissolve orally 5 times daily for the next 14 days.  Please seek follow-up care with your OB/GYN or primary care provider if these do not fully resolve your symptoms.  Ensure you are rinsing your mouth out after use your inhaler and using denture cleaners for any partials and performing good oral care.  Return to clinic for any new or urgent symptoms.

## 2023-10-03 ENCOUNTER — Ambulatory Visit (INDEPENDENT_AMBULATORY_CARE_PROVIDER_SITE_OTHER): Admitting: Family Medicine

## 2023-10-03 ENCOUNTER — Telehealth: Payer: Self-pay

## 2023-10-03 ENCOUNTER — Encounter: Payer: Self-pay | Admitting: Family Medicine

## 2023-10-03 ENCOUNTER — Ambulatory Visit: Payer: Self-pay | Admitting: Family Medicine

## 2023-10-03 VITALS — BP 118/78 | HR 85 | Wt 260.2 lb

## 2023-10-03 DIAGNOSIS — R109 Unspecified abdominal pain: Secondary | ICD-10-CM

## 2023-10-03 DIAGNOSIS — G459 Transient cerebral ischemic attack, unspecified: Secondary | ICD-10-CM | POA: Diagnosis not present

## 2023-10-03 DIAGNOSIS — F319 Bipolar disorder, unspecified: Secondary | ICD-10-CM

## 2023-10-03 DIAGNOSIS — I82612 Acute embolism and thrombosis of superficial veins of left upper extremity: Secondary | ICD-10-CM

## 2023-10-03 DIAGNOSIS — O0990 Supervision of high risk pregnancy, unspecified, unspecified trimester: Secondary | ICD-10-CM

## 2023-10-03 DIAGNOSIS — Z8751 Personal history of pre-term labor: Secondary | ICD-10-CM

## 2023-10-03 DIAGNOSIS — Z141 Cystic fibrosis carrier: Secondary | ICD-10-CM

## 2023-10-03 DIAGNOSIS — A5901 Trichomonal vulvovaginitis: Secondary | ICD-10-CM

## 2023-10-03 DIAGNOSIS — Z3A34 34 weeks gestation of pregnancy: Secondary | ICD-10-CM

## 2023-10-03 LAB — POCT URINALYSIS DIPSTICK
Bilirubin, UA: NEGATIVE
Blood, UA: NEGATIVE
Glucose, UA: NEGATIVE
Ketones, UA: NEGATIVE
Leukocytes, UA: NEGATIVE
Nitrite, UA: NEGATIVE
Protein, UA: NEGATIVE
Spec Grav, UA: 1.01 (ref 1.010–1.025)
Urobilinogen, UA: 0.2 U/dL
pH, UA: 5 (ref 5.0–8.0)

## 2023-10-03 MED ORDER — CYCLOBENZAPRINE HCL 10 MG PO TABS: 10.0000 mg | ORAL_TABLET | Freq: Two times a day (BID) | ORAL | 3 refills | Status: DC | PRN

## 2023-10-03 MED ORDER — HYDROXYZINE HCL 25 MG PO TABS: 25.0000 mg | ORAL_TABLET | Freq: Four times a day (QID) | ORAL | 3 refills | Status: AC

## 2023-10-03 NOTE — Progress Notes (Signed)
   PRENATAL VISIT NOTE  Subjective:  Taylor Buck is a 33 y.o. [id number] at [redacted]w[redacted]d being seen today for ongoing prenatal care.  She is currently monitored for the following issues for this high-risk pregnancy and has History of preterm delivery; Bipolar 1 disorder (HCC); History of substance use disorder; Trichomonas vaginitis; Asthma; Family history of breast cancer; Nicotine  dependence, cigarettes, uncomplicated; Obstructive sleep apnea; PCOS (polycystic ovarian syndrome); Posttraumatic stress disorder; Supervision of high risk pregnancy, antepartum; Cystic fibrosis carrier; Pathological nipple discharge; BMI 40.0-44.9, adult (HCC); Abnormal glucose tolerance test; TIA (transient ischemic attack); Anxiety disorder affecting pregnancy, antepartum; Superficial venous thrombosis of arm, left; Plantar fasciitis, bilateral; and Nausea and vomiting in pregnancy on their problem list.  Patient reports right flank pain for about a week, stable. No noted aggravating or alleviating factors. She has also had significant pelvic pain and would like a cervical check today. Contractions: Irregular. Vag. Bleeding: None.  Movement: Present. Denies leaking of fluid.   The following portions of the patient's history were reviewed and updated as appropriate: allergies, current medications, past family history, past medical history, past social history, past surgical history and problem list.   Objective:    Vitals:   10/03/23 0959  BP: 118/78  Pulse: 85  Weight: 260 lb 3.2 oz (118 kg)    Fetal Status:  Fetal Heart Rate (bpm): 161 Fundal Height: 36 cm Movement: Present    General: Alert, oriented and cooperative. Patient is in no acute distress.  Skin: Skin is warm and dry. No rash noted.   Cardiovascular: Normal heart rate noted  Respiratory: Normal respiratory effort, no problems with respiration noted  Abdomen: Soft, gravid, appropriate for gestational age.  Pain/Pressure: Present     Pelvic:  Cervical exam performed in the presence of a chaperone Dilation: 1.5 Effacement (%): Thick Station: -3  Extremities: Normal range of motion.  Edema: Trace  Mental Status: Normal mood and affect. Normal behavior. Normal judgment and thought content.   Assessment and Plan:  Pregnancy: [id number] at [redacted]w[redacted]d 1. Supervision of high risk pregnancy, antepartum (Primary) Prenatal course reviewed BP, HR, FHR within normal limits Feeling regular FM   2. Bipolar 1 disorder (HCC) Stable, Abilify  10 mg daily  3. Superficial venous thrombosis of arm, left Lovenox  prophylaxis  4. TIA (transient ischemic attack) Lovenox  prophylaxis  5. History of preterm delivery Follow up with MFM today.  6. Cystic fibrosis carrier FOB not a carrier.  7. Trichomonas vaginitis Patient having flank pain, will send urine for testing and culture. Repeat TOC for trinchomonas at 36 weeks  8. [redacted] weeks gestation of pregnancy MFM recommends weekly fetal testing starting at 34 weeks  Preterm labor symptoms and general obstetric precautions including but not limited to vaginal bleeding, contractions, leaking of fluid and fetal movement were reviewed in detail with the patient. Please refer to After Visit Summary for other counseling recommendations.   Return in about 2 weeks (around 10/17/2023) for HOB+GBS.  Future Appointments  Date Time Provider Department Center  10/07/2023  1:15 PM [facility] PROVIDER 1 [facility]  10/07/2023  1:30 PM [facility] US1 [facility]  10/21/2023  8:30 AM Tobie Arleta SQUIBB, MD [facility] None  10/31/2023  8:00 PM Neysa Reggy JONETTA, MD [facility]  12/30/2023 10:00 AM [facility] PHLEBOTOMIST [facility] None  12/30/2023 10:20 AM Cloretta Arley NOVAK, MD [facility] None    Joesph DELENA Sear, PA

## 2023-10-03 NOTE — Progress Notes (Signed)
 Pt presents for ROB visit. Pt c/o kidney pain, pressure and contractions. Requesting cervical check

## 2023-10-03 NOTE — Addendum Note (Signed)
 Addended by: ALVIA ROSINA GAILS on: 10/03/2023 11:54 AM   Modules accepted: Orders

## 2023-10-03 NOTE — Patient Instructions (Signed)
 Reasons to go to MAU at [facility]: Less than 36 weeks: Contractions feels like menstrual cramps. You should go to the hospital if you have more than 6 contractions in an hour, even after you have rested and drank at least 16 ounces of water.  More than 36 weeks: You begin to have strong, frequent contractions 5 minutes apart or less, each last 1 minute, these have been going on for 1-2 hours, and you cannot walk or talk during them. Your water breaks.  Sometimes it is a big gush of fluid. However, many times it may it may be much more subtle. You should go to the hospital if you have a constant leakage of fluid from your vagina, enough to soak a pad when you are walking around.  You have vaginal bleeding.  It is normal to have a small amount of spotting if your cervix was checked. If you have bleeding requiring the use of a pad, go to the hospital. You don't feel your baby moving like normal.  If you think that you baby's movement is decreased, eat a snack and rest on your left side in a quiet room for one hour. If you have not felt the baby move more than 6 times in an hour GO TO THE HOSPITAL.

## 2023-10-05 LAB — URINE CULTURE, OB REFLEX

## 2023-10-05 LAB — CULTURE, OB URINE

## 2023-10-06 ENCOUNTER — Ambulatory Visit

## 2023-10-06 ENCOUNTER — Telehealth: Payer: Self-pay

## 2023-10-06 ENCOUNTER — Encounter: Payer: Self-pay | Admitting: Obstetrics and Gynecology

## 2023-10-06 NOTE — Telephone Encounter (Signed)
 S/w pt who advised that she has been having irregular contractions maybe 10-15 minutes apart, back pain and vaginal pressure. Denies vaginal bleeding, leaking fluid, and pt advised that she has been drinking a lot of water but is really anxious. Advised pt of positional changes to help relieve pressure, signs of preterm labor, and sent safe med list in [place] for possible help with symptoms reported. Advised pt that if symptoms/discomfort do not improve be evaluated at [facility].

## 2023-10-07 ENCOUNTER — Ambulatory Visit

## 2023-10-09 ENCOUNTER — Other Ambulatory Visit: Payer: Self-pay | Admitting: *Deleted

## 2023-10-09 ENCOUNTER — Ambulatory Visit: Attending: Obstetrics and Gynecology

## 2023-10-09 ENCOUNTER — Other Ambulatory Visit: Payer: Self-pay | Admitting: Obstetrics

## 2023-10-09 ENCOUNTER — Ambulatory Visit (HOSPITAL_BASED_OUTPATIENT_CLINIC_OR_DEPARTMENT_OTHER): Admitting: Obstetrics and Gynecology

## 2023-10-09 VITALS — BP 122/62 | HR 94

## 2023-10-09 DIAGNOSIS — O09213 Supervision of pregnancy with history of pre-term labor, third trimester: Secondary | ICD-10-CM

## 2023-10-09 DIAGNOSIS — Z6841 Body Mass Index (BMI) 40.0 and over, adult: Secondary | ICD-10-CM | POA: Diagnosis present

## 2023-10-09 DIAGNOSIS — Z8673 Personal history of transient ischemic attack (TIA), and cerebral infarction without residual deficits: Secondary | ICD-10-CM

## 2023-10-09 DIAGNOSIS — Z3A35 35 weeks gestation of pregnancy: Secondary | ICD-10-CM | POA: Insufficient documentation

## 2023-10-09 DIAGNOSIS — O2233 Deep phlebothrombosis in pregnancy, third trimester: Secondary | ICD-10-CM

## 2023-10-09 DIAGNOSIS — O09891 Supervision of other high risk pregnancies, first trimester: Secondary | ICD-10-CM

## 2023-10-09 DIAGNOSIS — Z8759 Personal history of other complications of pregnancy, childbirth and the puerperium: Secondary | ICD-10-CM

## 2023-10-09 DIAGNOSIS — O99213 Obesity complicating pregnancy, third trimester: Secondary | ICD-10-CM

## 2023-10-09 DIAGNOSIS — E669 Obesity, unspecified: Secondary | ICD-10-CM | POA: Diagnosis not present

## 2023-10-09 DIAGNOSIS — Z141 Cystic fibrosis carrier: Secondary | ICD-10-CM

## 2023-10-09 DIAGNOSIS — Z7901 Long term (current) use of anticoagulants: Secondary | ICD-10-CM | POA: Diagnosis not present

## 2023-10-09 DIAGNOSIS — Z86718 Personal history of other venous thrombosis and embolism: Secondary | ICD-10-CM | POA: Insufficient documentation

## 2023-10-09 NOTE — Progress Notes (Signed)
 Maternal-Fetal Medicine Consultation  Name: Taylor Buck  MRN: [id number]  GA: [id number] [redacted]w[redacted]d   Patient returned for fetal growth assessment and antenatal testing. - Cystic fibrosis carrier.  Patient had amniocentesis and the fetal karyotype was normal.  Fetus was not affected with cystic fibrosis. - History of DVT and TIA in May 2025.  Patient is on therapeutic Lovenox  120 mg twice daily.  Antiphospholipid antibodies are negative.  Maternal echocardiography was reported as normal. - Patient does not have gestational diabetes. Obstetrical history significant for 2 vaginal deliveries.  Ultrasound Fetal growth is appropriate for gestational age.  Amniotic fluid is normal good fetal activity seen.  Cephalic presentation.  Antenatal testing is reassuring.  BPP 8/8.  History of DVT and anticoagulation I reassured the patient of the findings.  We discussed switching Lovenox  to unfractionated heparin at [redacted] weeks gestation.  She had 1 preterm delivery at [redacted] weeks gestation.  It is reasonable to transition to unfractionated heparin. I recommend starting unfractionated heparin 10,000 units twice daily at [redacted] weeks gestation. She should have aPTT checked 6 hours after 3rd or the 4th dose to keep it 1.5 to 2.5 x control. If induction of labor is planned, heparin should be held for 24 hours before administering regional anesthesia.  Patient strongly desires epidural analgesia. I recommend induction of labor at [redacted] weeks gestation.   Recommendations -Switch to Unfractionated heparin 10,000 units twice daily from 36-weeks' gestation. Discontinue lovenox  at 36 weeks. -Hold heparin for 24 hours before planned epidural analgesia. -Restart lovenox  4 to 6 hours after vaginal delivery or 8 to 12 hours after cesarean delivery. Anticoagulation should be continued for at least 6 weeks. -Continue weekly antenatal testing till delivery.     Consultation including face-to-face (more than 50%)  counseling 30 minutes.

## 2023-10-11 NOTE — Progress Notes (Signed)
 Subjective:   There are no active problems to display for this patient.    Chief Complaint  Patient presents with  . Sore Throat    Pt has had a sore throat for 3 weeks. States she has been seen at urgent cares which all testing keeps coming back Neg. States she feels she is swallowing razor blades, her tongue hurts, and states she now has hives. She is [redacted] weeks pregnant. States she does have a cough but no meds are helping. States it is hard to breathe.      History of Present Illness This is a 33 year old female with a history of acid reflux presenting with a sore throat.  She reports persistent throat pain, describing it as a raw sensation that has been ongoing for three weeks. Her tongue also feels raw and painful.  She has been evaluated by urgent care multiple times for similar symptoms.  She has tested negative for strep throat, COVID, and a negative throat culture.  She was also diagnosed with thrush and treated with nystatin  and clotrimazole  lozenges.  Despite two different treatments she continues to experience discomfort to the back of her throat and tongue.    She also mentions the recent appearance of hives to her lower back. The hives become red and causing significant discomfort. She has been managing the hives with cold water and has avoided taking Benadryl  due to concerns about exacerbating her restless legs syndrome.  She has also applied a topical over-the-counter hydrocortisone cream.  She has been experiencing acid reflux during her pregnancy, which she manages with Carafate  and Protonix . However, these medications do not fully alleviate her symptoms. A few months ago, she received IV fluids and a different type of nausea medication along with Protonix  , which provided relief for three consecutive days. She has had limited oral intake due to vomiting and burning sensations in the back of her throat.  She states almost any type of food gives her acid reflux which resulted in  subsequent episodes of vomiting throughout the day. She believes her inability to keep food down is due to heartburn and nausea, which often lead to vomiting. She has been taking Zofran  for nausea, but it has not been effective recently.  She describes the reflux as a raw/burning sensation in her throat and mouth.   Parts of patient history reviewed include PMH, problem list, medications, allergies, and social history.  Objective:   Vitals:   10/11/23 1719  BP: 126/83  BP Location: Right arm  Patient Position: Sitting  Pulse: 96  Resp: (!) 24  Temp: 97.9 F (36.6 C)  TempSrc: Tympanic  SpO2: 99%  Weight: 117 kg (258 lb)  Height: 1.676 m (5' 6)    Physical Exam Vitals and nursing note reviewed.  Constitutional:      General: She is not in acute distress.    Appearance: Normal appearance. She is not ill-appearing or toxic-appearing.  HENT:     Head: Normocephalic and atraumatic.     Mouth/Throat:     Lips: Pink.     Mouth: Mucous membranes are moist. No oral lesions.     Tongue: No lesions.     Pharynx: Oropharynx is clear. Uvula midline. No oropharyngeal exudate or uvula swelling.     Tonsils: No tonsillar exudate. 0 on the right. 0 on the left.     Comments: Mild pharyngeal erythema  No white film/lesions to the tongue Pulmonary:     Effort: Pulmonary effort is normal.  No respiratory distress.     Breath sounds: Normal breath sounds.  Skin:    General: Skin is warm.     Capillary Refill: Capillary refill takes less than 2 seconds.     Findings: Rash present.     Comments: Faint, maculopapule rash limited to the posterior lumbar spine.  No erythema, swelling, oozing/weeping.  Neurological:     Mental Status: She is alert and oriented to person, place, and time. Mental status is at baseline.     Assessment/Plan:   Assessment & Plan  Sore throat -Famotidine  -Protonix  -Follow-up with OB/GYN  2. Rash - Triamcinolone topical cream  This is a 33 year old 37-week  2-day pregnant female presenting with rash to her posterior back that is itchy and bothersome in nature.  History and exam findings not consistent with dangerous etiologies of rash such as SJS/TEN, anaphylaxis, or PUPPP.  Plan at this time is to treat symptomatically with topical triamcinolone cream.  Patient also has complaints of 3-week history of raw tongue with severe sore throat.  Has had multiple negative strep throat test as well as throat cultures.  Has had 2 courses of treatment for thrush including nystatin  ankle Metrozol.  Patient relays history of acid reflux, frequent vomiting and reflux throughout the day.  Patent airway on physical exam.  No lesions to the tongue or white film concerning for thrush.  There are no tonsillar exudates, uvular deviations, or obvious abscesses.  There is no evidence of bacterial infection at time of my exam.   I suspect the sore throat in the burning to her tongue are secondary to reflux likely intensified over the last 3 weeks as she has been near to the end of her pregnancy. Currently taking Protonix  20 mg daily as well as Carafate  with minimal relief.  Plan is to add famotidine  at bedtime and increase Protonix  dose to 40 mg.  We reviewed lifestyle modifications to limit symptomology.  Advised follow-up with OB/GYN.  Patient verbalized understanding   Routine Follow Up with Specialist   Patient has been instructed on medications, dosages, side effects, and possible interactions as associated with each diagnosis in my impression and plan above. Patient education (verbal/handout) given on diagnosis, pathophysiology, treatment of diagnosis, side effects of medication use for treatment, restrictions while taking medication, and supportive measures.   Patient was instructed on when to follow up and know that they can follow up here, with their PCP, Urgent Care, ED.  They have been instructed that if symptoms worsen that should return to the clinic, go to the nearest  ED, or activate EMS. Red Flags associated with their diagnoses were reviewed and patient was educated on what to do if red.       Patient agreed with plan and voiced understanding.  No barriers to adherence perceived by myself.  Portions of this note may have been dictated using Dragon dictation software/hardware and may contain grammatical or spelling errors.   Electronically signed by:   Sotero Pore, DNP ENP-C FNP-C [facility]  10/11/2023 5:27 PM

## 2023-10-16 ENCOUNTER — Ambulatory Visit: Attending: Obstetrics and Gynecology

## 2023-10-16 DIAGNOSIS — O99213 Obesity complicating pregnancy, third trimester: Secondary | ICD-10-CM | POA: Insufficient documentation

## 2023-10-16 DIAGNOSIS — Z7901 Long term (current) use of anticoagulants: Secondary | ICD-10-CM | POA: Insufficient documentation

## 2023-10-16 DIAGNOSIS — Z6841 Body Mass Index (BMI) 40.0 and over, adult: Secondary | ICD-10-CM

## 2023-10-16 DIAGNOSIS — Z3A36 36 weeks gestation of pregnancy: Secondary | ICD-10-CM | POA: Diagnosis not present

## 2023-10-16 DIAGNOSIS — Z86718 Personal history of other venous thrombosis and embolism: Secondary | ICD-10-CM | POA: Diagnosis not present

## 2023-10-16 DIAGNOSIS — E669 Obesity, unspecified: Secondary | ICD-10-CM

## 2023-10-16 NOTE — Procedures (Signed)
 Taylor Buck 02/08/1990 [redacted]w[redacted]d  Fetus A Non-Stress Test Interpretation for 10/16/23  Indication: Hx of DVT on anticoagulants - NST only  Fetal Heart Rate A Mode: External Baseline Rate (A): 145 bpm Variability: Moderate Accelerations: 15 x 15 Decelerations: None Multiple birth?: No  Uterine Activity Mode: Toco, Palpation Contraction Frequency (min): none noted Resting Tone Palpated: Relaxed  Interpretation (Fetal Testing) Nonstress Test Interpretation: Reactive Comments: Reviewed with Dr. Ileana

## 2023-10-17 ENCOUNTER — Telehealth: Payer: Self-pay | Admitting: Allergy & Immunology

## 2023-10-17 ENCOUNTER — Encounter: Payer: Self-pay | Admitting: Family Medicine

## 2023-10-17 ENCOUNTER — Ambulatory Visit: Admitting: Family Medicine

## 2023-10-17 ENCOUNTER — Other Ambulatory Visit (HOSPITAL_COMMUNITY)
Admission: RE | Admit: 2023-10-17 | Discharge: 2023-10-17 | Disposition: A | Discharge status: Home / Self Care | Source: Ambulatory Visit | Attending: Family Medicine | Admitting: Family Medicine

## 2023-10-17 VITALS — BP 114/80 | HR 105 | Wt 268.6 lb

## 2023-10-17 DIAGNOSIS — I82612 Acute embolism and thrombosis of superficial veins of left upper extremity: Secondary | ICD-10-CM | POA: Diagnosis not present

## 2023-10-17 DIAGNOSIS — H547 Unspecified visual loss: Secondary | ICD-10-CM

## 2023-10-17 DIAGNOSIS — J40 Bronchitis, not specified as acute or chronic: Secondary | ICD-10-CM

## 2023-10-17 DIAGNOSIS — O0993 Supervision of high risk pregnancy, unspecified, third trimester: Secondary | ICD-10-CM | POA: Diagnosis not present

## 2023-10-17 DIAGNOSIS — I8290 Acute embolism and thrombosis of unspecified vein: Secondary | ICD-10-CM

## 2023-10-17 DIAGNOSIS — Z141 Cystic fibrosis carrier: Secondary | ICD-10-CM

## 2023-10-17 DIAGNOSIS — Z3483 Encounter for supervision of other normal pregnancy, third trimester: Secondary | ICD-10-CM | POA: Insufficient documentation

## 2023-10-17 DIAGNOSIS — G459 Transient cerebral ischemic attack, unspecified: Secondary | ICD-10-CM | POA: Diagnosis not present

## 2023-10-17 DIAGNOSIS — O0990 Supervision of high risk pregnancy, unspecified, unspecified trimester: Secondary | ICD-10-CM | POA: Diagnosis not present

## 2023-10-17 DIAGNOSIS — F319 Bipolar disorder, unspecified: Secondary | ICD-10-CM

## 2023-10-17 DIAGNOSIS — Z3A36 36 weeks gestation of pregnancy: Secondary | ICD-10-CM

## 2023-10-17 DIAGNOSIS — J4520 Mild intermittent asthma, uncomplicated: Secondary | ICD-10-CM

## 2023-10-17 DIAGNOSIS — Z3A16 16 weeks gestation of pregnancy: Secondary | ICD-10-CM

## 2023-10-17 MED ORDER — GUAIFENESIN-DM 100-10 MG/5ML PO SYRP: 5.0000 mL | ORAL_SOLUTION | ORAL | 0 refills | Status: DC | PRN

## 2023-10-17 MED ORDER — HEPARIN SODIUM (PORCINE) 10000 UNIT/ML IJ SOLN: 10000.0000 [IU] | Freq: Two times a day (BID) | INTRAMUSCULAR | 20 refills | Status: DC

## 2023-10-17 NOTE — Progress Notes (Signed)
 Pt states she is switching from lovenox  to heparin  after today for the remainder of her pregnancy. She took her lovenox  this morning.   Pt states heparin  needs to be ordered by OB per MAU. Dosing to be discussed. She states the MD has left a note in her MyChart.   Pt concerned she has bronchitis. S/S; productive cough, green phlegm, sore throat. Pt has been diagnosed with the bronchitis before and suffers from asthma, but stopped one of her inhalers due to thrush.   Also has questions for provider about breast pump.

## 2023-10-17 NOTE — Addendum Note (Signed)
 Addended by: WALLACE SEARCH on: 10/17/2023 12:56 PM   Modules accepted: Orders

## 2023-10-17 NOTE — Progress Notes (Addendum)
 PRENATAL VISIT NOTE  Subjective:  Taylor Buck is a 33 y.o. [id number] at [redacted]w[redacted]d being seen today for ongoing prenatal care.  She is currently monitored for the following issues for this high-risk pregnancy and has History of preterm delivery; Bipolar 1 disorder (HCC); History of substance use disorder; Trichomonas vaginitis; Asthma; Family history of breast cancer; Nicotine  dependence, cigarettes, uncomplicated; Obstructive sleep apnea; PCOS (polycystic ovarian syndrome); Posttraumatic stress disorder; Supervision of high risk pregnancy, antepartum; Cystic fibrosis carrier; Pathological nipple discharge; BMI 40.0-44.9, adult (HCC); Abnormal glucose tolerance test; TIA (transient ischemic attack); Anxiety disorder affecting pregnancy, antepartum; Superficial venous thrombosis of arm, left; Plantar fasciitis, bilateral; and Nausea and vomiting in pregnancy on their problem list.  Patient reports that she has had 3 weeks of sore throat, cough, green phlegm. Urgent care increased Protonix  to 40 mg daily and added famotidine . Contractions: Irritability. Vag. Bleeding: None.  Movement: Present. Denies leaking of fluid.   The following portions of the patient's history were reviewed and updated as appropriate: allergies, current medications, past family history, past medical history, past social history, past surgical history and problem list.   Objective:    Vitals:   10/17/23 1027  BP: 114/80  Pulse: (!) 105  Weight: 268 lb 9.6 oz (121.8 kg)    Fetal Status:  Fetal Heart Rate (bpm): 144   Movement: Present    General: Alert, oriented and cooperative. Patient is in no acute distress.  Skin: Skin is warm and dry. No rash noted.   Cardiovascular: Normal heart rate noted  Respiratory: Normal respiratory effort, no problems with respiration noted  Abdomen: Soft, gravid, appropriate for gestational age.  Pain/Pressure: Present     Pelvic: Cervical exam deferred        Extremities:  Normal range of motion.  Edema: Trace  Mental Status: Normal mood and affect. Normal behavior. Normal judgment and thought content.   Assessment and Plan:  Pregnancy: [id number] at [redacted]w[redacted]d 1. Supervision of high risk pregnancy, antepartum (Primary) Prenatal course reviewed BP, HR, FHR within normal limits Feeling regular FM Checking on breast pump order Schedule IOL for 39 weeks  2. Superficial venous thrombosis of arm, left History of DVT and TIA in May 2025. Patient is on therapeutic Lovenox  120 mg twice daily. Antiphospholipid antibodies are negative. Maternal echocardiography was reported as normal.  Per MFM, switch Lovenox  to unfractionated heparin  at 36 weeks. MFM recommends heparin  10,000 units twice daily. -She should have aPTT checked 6 hours after 3rd or the 4th dose to keep it 1.5 to 2.5 x control. Will check aPTT on Monday after Monday AM dose. -Hold heparin  for 24 hours before planned epidural analgesia. -Restart lovenox  4 to 6 hours after vaginal delivery or 8 to 12 hours after cesarean delivery. Anticoagulation should be continued for at least 6 weeks. -MFM recommends induction of labor at [redacted] weeks gestation.  3. TIA (transient ischemic attack) See above  4. Mild intermittent asthma without complication Not currently using Breztri  inhaler due to thrush. Follow up with asthma specialist on Monday. Prescription for Robitussin DM sent to pharmacy to use over the weekend.  5. Bipolar 1 disorder (HCC) Stable on Ability  6. Cystic fibrosis carrier Patient had amniocentesis and the fetal karyotype was normal. Fetus was not affected with cystic fibrosis.   7. [redacted] weeks gestation of pregnancy GBS and GC/CT swabs today. - Cervicovaginal ancillary only( [place]) - Culture, beta strep (group b only)   Preterm labor symptoms and general obstetric precautions including but  not limited to vaginal bleeding, contractions, leaking of fluid and fetal movement were reviewed in detail  with the patient. Please refer to After Visit Summary for other counseling recommendations.   Return in about 1 week (around 10/24/2023) for [facility] with MD.  Future Appointments  Date Time Provider Department Center  10/20/2023 11:30 AM [facility] None  10/21/2023  8:30 AM Tobie Arleta SQUIBB, MD [facility] None  10/23/2023  9:45 AM [facility] NST [facility]  10/24/2023 11:15 AM Zina Jerilynn LABOR, MD [facility] None  10/31/2023  8:00 PM Neysa Reggy BIRCH, MD [facility]  11/04/2023  2:00 PM [facility] PROVIDER 1 [facility]  11/04/2023  2:15 PM [facility] US4 [facility]  12/30/2023 10:00 AM [facility] PHLEBOTOMIST [facility] None  12/30/2023 10:20 AM Cloretta Arley NOVAK, MD [facility] None    [place] LABOR Sear, PA

## 2023-10-17 NOTE — Telephone Encounter (Signed)
 Patient called and asked to cancel her appointment next Tuesday, September 23 because it was going to be interrupting with her induction date for her upcoming birth.  She will call back and schedule once things calm down.  Marty Shaggy, MD [facility] 

## 2023-10-17 NOTE — Patient Instructions (Addendum)
 Continue your Lovenox  for today and tomorrow. On Sunday, stop Lovenox  and start heparin  instead.  We will check labs on Monday afternoon 6 hours after your Monday morning dose of heparin .  Upper Respiratory Illness:    Aches/Pains, Fever, Headache OTC Acetaminophen  (Tylenol ) 500 mg tablets - take max 2 tablets (1000 mg) every 6 hours (4 times per day) I would recommend starting Tylenol  on a schedule for pain!    Sinus Congestion Prescription fluticasone (Flonase) as directed Nasal Saline if desired to rinse pseudoephedrine (Sudafed) -  use only after [redacted] weeks gestation and if you do not have high blood pressure  Oxymetolazone (Afrin, others) sparing use due to rebound congestion, NEVER use in kids Phenylephrine  (Sudafed) 10 mg tablets every 4 hours (or the 12-hour formulation)* Diphenhydramine  (Benadryl ) 25 mg tablets - take max 2 tablets every 4 hours, can make you drowsy/sleepy cetirizine (Zyrtec) - similar to Benadryl  but does not make you sleepy Vicks Vaporub - opens up nasal passages to make breathing easier Breath right strips   Cough & Sore Throat Alcohol-free cough drops Chloraseptic throat spray Cepacol throat lozenges Cold-Eeze - up to three times per day  Dextromethorphan (Robitussin, others) - cough suppressant Guaifenesin  (Robitussin, Mucinex , others) - expectorant (helps cough up mucus) Robitussin DM (plain only, alcohol-free) - includes cough suppressant! (Dextromethorphan and Guaifenesin  also come in a combination tablet/syrup) Lozenges w/ Benzocaine  + Menthol  (Cepacol) Honey - as much as you want! It has some antimicrobial properties and helps soothe the throat Teas which coat the throat - look for ingredients Elm Bark, Licorice Root, Marshmallow Root Any warm beverage or soup can be soothing for sore throat   Other Zinc Lozenges within 24 hours of symptoms onset - mixed evidence this shortens the duration of the common cold

## 2023-10-20 ENCOUNTER — Encounter: Payer: Self-pay | Admitting: Family Medicine

## 2023-10-20 ENCOUNTER — Other Ambulatory Visit: Payer: Self-pay

## 2023-10-20 ENCOUNTER — Other Ambulatory Visit

## 2023-10-20 ENCOUNTER — Other Ambulatory Visit: Payer: Self-pay | Admitting: Family Medicine

## 2023-10-20 DIAGNOSIS — I82612 Acute embolism and thrombosis of superficial veins of left upper extremity: Secondary | ICD-10-CM

## 2023-10-20 LAB — CERVICOVAGINAL ANCILLARY ONLY
Bacterial Vaginitis (gardnerella): NEGATIVE
Candida Glabrata: NEGATIVE
Candida Vaginitis: NEGATIVE
Chlamydia: NEGATIVE
Comment: NEGATIVE
Comment: NEGATIVE
Comment: NEGATIVE
Comment: NEGATIVE
Comment: NEGATIVE
Comment: NORMAL
Neisseria Gonorrhea: NEGATIVE
Trichomonas: NEGATIVE

## 2023-10-21 ENCOUNTER — Encounter: Admitting: Obstetrics & Gynecology

## 2023-10-21 ENCOUNTER — Ambulatory Visit: Payer: Self-pay | Admitting: Family Medicine

## 2023-10-21 ENCOUNTER — Ambulatory Visit: Admitting: Internal Medicine

## 2023-10-21 DIAGNOSIS — O0990 Supervision of high risk pregnancy, unspecified, unspecified trimester: Secondary | ICD-10-CM

## 2023-10-21 LAB — CULTURE, BETA STREP (GROUP B ONLY): Strep Gp B Culture: NEGATIVE

## 2023-10-21 LAB — APTT: aPTT: 26 s (ref 24–33)

## 2023-10-23 ENCOUNTER — Ambulatory Visit: Attending: Obstetrics and Gynecology

## 2023-10-23 ENCOUNTER — Inpatient Hospital Stay (HOSPITAL_COMMUNITY)
Admission: AD | Admit: 2023-10-23 | Discharge: 2023-10-23 | Disposition: A | Discharge status: Home / Self Care | Attending: Obstetrics and Gynecology | Admitting: Obstetrics and Gynecology

## 2023-10-23 ENCOUNTER — Encounter (HOSPITAL_COMMUNITY): Payer: Self-pay | Admitting: Obstetrics and Gynecology

## 2023-10-23 DIAGNOSIS — E669 Obesity, unspecified: Secondary | ICD-10-CM

## 2023-10-23 DIAGNOSIS — O471 False labor at or after 37 completed weeks of gestation: Secondary | ICD-10-CM | POA: Diagnosis not present

## 2023-10-23 DIAGNOSIS — O99213 Obesity complicating pregnancy, third trimester: Secondary | ICD-10-CM

## 2023-10-23 DIAGNOSIS — Z3A37 37 weeks gestation of pregnancy: Secondary | ICD-10-CM

## 2023-10-23 DIAGNOSIS — Z0371 Encounter for suspected problem with amniotic cavity and membrane ruled out: Secondary | ICD-10-CM | POA: Insufficient documentation

## 2023-10-23 DIAGNOSIS — Z87891 Personal history of nicotine dependence: Secondary | ICD-10-CM | POA: Insufficient documentation

## 2023-10-23 LAB — URINALYSIS, ROUTINE W REFLEX MICROSCOPIC
Bilirubin Urine: NEGATIVE
Glucose, UA: NEGATIVE mg/dL
Hgb urine dipstick: NEGATIVE
Ketones, ur: NEGATIVE mg/dL
Nitrite: NEGATIVE
Protein, ur: 30 mg/dL — AB
Specific Gravity, Urine: 1.024 (ref 1.005–1.030)
pH: 6 (ref 5.0–8.0)

## 2023-10-23 LAB — POCT FERN TEST: POCT Fern Test: NEGATIVE

## 2023-10-23 LAB — RUPTURE OF MEMBRANE (ROM)PLUS: Rom Plus: NEGATIVE

## 2023-10-23 MED ORDER — MORPHINE SULFATE (PF) 4 MG/ML IV SOLN
4.0000 mg | Freq: Once | INTRAVENOUS | Status: AC
  Administered 2023-10-23: 4 mg via INTRAMUSCULAR
  Filled 2023-10-23: qty 1

## 2023-10-23 NOTE — Discharge Instructions (Addendum)
 For pain at home, you can certainly use Tylenol  and the Flexeril  you were prescribed as well! HOLD your dose of heparin  tonight. If you are still contracting every 2-3 minutes and you cannot talk through contractions, return to the MAU for a labor evaluation. If you are not contracting in the morning, call or message your OB office to ask about restarting heparin .  Reasons to return to MAU at [facility]: More than 36 weeks: You begin to have strong, frequent contractions 5 minutes apart or less, each last 1 minute, these have been going on for 1-2 hours, and you cannot walk or talk during any of them. Your water breaks.  Sometimes it is a big gush of fluid. However, many times it may it may be much more subtle. You should go to the hospital if you have a constant leakage of fluid from your vagina, enough to soak a pad when you are walking around.  You have vaginal bleeding.  It is normal to have a small amount of spotting if your cervix was checked. If you have bleeding requiring the use of a pad, go to the hospital. You don't feel your baby moving like normal.  If you think that you baby's movement is decreased, eat a snack and rest on your left side in a quiet room for one hour. If you have not felt the baby move more than 6 times in an hour GO TO THE HOSPITAL.

## 2023-10-23 NOTE — MAU Note (Signed)
 Pt went to her MFM appt and was contracting every 2 min with a 6/10 on pain scale. She was instructed to come to MAU for evaluation. Pt states that she feels like she is leaking fluid. VEVA LITTIE POD, RN

## 2023-10-23 NOTE — Procedures (Signed)
 Taylor Buck 01/11/1991 [redacted]w[redacted]d  Fetus A Non-Stress Test Interpretation for 10/23/23  Indication: History of TIA with anticoagulation and maternal obesity - NST only  Fetal Heart Rate A Mode: External Baseline Rate (A): 130 bpm Variability: Moderate Accelerations: 15 x 15 Decelerations: None Multiple birth?: No  Uterine Activity Mode: Toco, Palpation Contraction Frequency (min): 2-3 Contraction Duration (sec): 50-70 Contraction Quality: Mild (pt reports 6/10 pain with ctx) Resting Tone Palpated: Relaxed  Interpretation (Fetal Testing) Nonstress Test Interpretation: Reactive Comments: Reviewed with Dr. Ileana

## 2023-10-23 NOTE — MAU Note (Addendum)
 Taylor Buck is a 33 y.o. at [redacted]w[redacted]d here in MAU reporting: went for NST this morning.  They observed her ctx s were every 2-3 min.  Dr wanted her to come over, get checked out and be monitored. No bleeding or LOF. Reports +FM  Onset of complaint: 0700 Pain score: 7 Vitals:   10/23/23 1207  BP: 135/80  Pulse: 94  Resp: 20  Temp: 98.1 F (36.7 C)  SpO2: 98%     FHT:152 Lab orders placed from triage:  fern  When pt stood up, there was a wet spot in the chair and her pants were wet

## 2023-10-23 NOTE — MAU Provider Note (Signed)
 Chief Complaint:  Contractions and Rupture of Membranes   HPI   Event Date/Time   First Provider Initiated Contact with Patient 10/23/23 1311      Taylor Buck is a 33 y.o. [id number] at [redacted]w[redacted]d who presents to maternity admissions reporting contractions and leaking of fluids.  She reports contractions every 2-3 minutes, some quite strong. She reported this to MFM at her visit this morning and they recommended evaluation. She felt some fluid in her underwear while in triage at MAU.  Additional history obtained from partner  Pregnancy Course: Receives care at [facility] . Prenatal records reviewed.   Past Medical History:  Diagnosis Date   Anxiety    Arthritis    Asthma    inhaler laat used today   Depression    Heart murmur    Infection    chlamydia age 37   PCOS (polycystic ovarian syndrome)    Sleep apnea    TIA (transient ischemic attack) 2025   Urinary tract infection    OB History  Gravida Para Term Preterm AB Living  5 2 1 1 2 2   SAB IAB Ectopic Multiple Live Births  2 0 0 0 2    # Outcome Date GA Lbr Len/2nd Weight Sex Type Anes PTL Lv  5 Current           4 Term 05/27/18 [redacted]w[redacted]d 10:28 / 02:40 3100 g F Vag-Spont EPI  LIV  3 SAB 04/2017          2 Preterm 07/31/12 [redacted]w[redacted]d 07:00  M Vag-Spont None Y LIV  1 SAB 2008     SAB      Past Surgical History:  Procedure Laterality Date   CHOLECYSTECTOMY     HAND SURGERY     reconstruction- injury due to frostbite as child   HERNIA REPAIR     rt inguinal   Family History  Problem Relation Age of Onset   Asthma Mother    COPD Mother    Diabetes Maternal Aunt    Cancer Maternal Aunt        breast   Diabetes Maternal Grandmother    Cancer Maternal Grandmother        skin cancer   Alcoholism Other    Arthritis Other    Asthma Other    Depression Other    Hearing loss Neg Hx    Social History   Tobacco Use   Smoking status: Former    Current packs/day: 0.00    Types:  Cigarettes    Quit date: 10/2022    Years since quitting: 0.9    Passive exposure: Current   Smokeless tobacco: Former   Tobacco comments:    1 PACK A DAY - 4 CIGARETTES A DAY; nicotine  patches now  Vaping Use   Vaping status: Never Used  Substance Use Topics   Alcohol use: No   Drug use: Not Currently    Types: Marijuana    Comment: UP UNTIL +UPT   Allergies  Allergen Reactions   Milk (Cow) Anaphylaxis   Milk-Related Compounds Anaphylaxis   Penicillins Hives, Anaphylaxis and Dermatitis    Has patient had a PCN reaction causing immediate rash, facial/tongue/throat swelling, SOB or lightheadedness with hypotension: Yes  Has patient had a PCN reaction causing severe rash involving mucus membranes or skin necrosis: No  Has patient had a PCN reaction that required hospitalization: No  Has patient had a PCN reaction occurring within the last 10 years: No  If all of the above answers are NO, then may proceed with Cephalosporin use.  Has patient had a PCN reaction causing immediate rash, facial/tongue/throat swelling, SOB or lightheadedness with hypotension: Yes Has patient had a PCN reaction causing severe rash involving mucus membranes or skin necrosis: No Has patient had a PCN reaction that required hospitalization: No Has patient had a PCN reaction occurring within the last 10 years: No If all of the above answers are NO, then may proceed with Cephalosporin use.   Latex Hives and Dermatitis   Lorazepam Other (See Comments)    PANIC   Metoclopramide  Other (See Comments)    Panic attack   Promethazine      Other Reaction(s): Fever  Panic attack  Other Reaction(s): Fever    Panic attack   Promethazine  Hcl     Other Reaction(s): Fever  Panic attack   Phenergan  [Promethazine  Hcl]     Panic attack   Medications Prior to Admission  Medication Sig Dispense Refill Last Dose/Taking   acetaminophen  (TYLENOL ) 325 MG tablet Take 650 mg by mouth daily as needed for pain.       albuterol  (VENTOLIN  HFA) 108 (90 Base) MCG/ACT inhaler Inhale 2 puffs into the lungs every 6 (six) hours as needed for wheezing or shortness of breath.      ARIPiprazole  (ABILIFY ) 10 MG tablet Take 1 tablet (10 mg total) by mouth daily. 30 tablet 11    Blood Pressure Monitoring (BLOOD PRESSURE KIT) DEVI 1 Device by Does not apply route once a week. 1 each 0    budesonide-glycopyrrolate-formoterol (BREZTRI  AEROSPHERE) 160-9-4.8 MCG/ACT AERO inhaler Inhale 2 puffs into the lungs in the morning and at bedtime. (Patient not taking: Reported on 10/17/2023) 10.7 g 5    calcium  carbonate (TUMS EX) 750 MG chewable tablet Chew 1 tablet by mouth at bedtime as needed (for nausea).      cyclobenzaprine  (FLEXERIL ) 10 MG tablet Take 1 tablet (10 mg total) by mouth 2 (two) times daily as needed for muscle spasms. 20 tablet 3    guaiFENesin -dextromethorphan (ROBITUSSIN DM) 100-10 MG/5ML syrup Take 5 mLs by mouth every 4 (four) hours as needed for cough. 118 mL 0    heparin  10000 UNIT/ML injection Inject 1 mL (10,000 Units total) into the skin every 12 (twelve) hours. 5 mL 20    hydrOXYzine  (ATARAX ) 25 MG tablet Take 1 tablet (25 mg total) by mouth every 6 (six) hours. 30 tablet 3    Multiple Vitamins-Minerals (MULTIVITAMIN WITH MINERALS) tablet Take 1 tablet by mouth daily.      nicotine  (NICODERM CQ  - DOSED IN MG/24 HOURS) 14 mg/24hr patch Place 1 patch (14 mg total) onto the skin daily. (Patient not taking: Reported on 09/23/2023) 28 patch 4    Prenatal Vit-Fe Phos-FA-Omega (VITAFOL  GUMMIES) 3.33-0.333-34.8 MG CHEW CHEW 1 TABLET BY MOUTH EVERY DAY 90 tablet 5    sucralfate  (CARAFATE ) 1 GM/10ML suspension Take 10 mLs (1 g total) by mouth 4 (four) times daily -  with meals and at bedtime. 420 mL 0     I have reviewed patient's Past Medical Hx, Surgical Hx, Family Hx, Social Hx, medications and allergies.   ROS  Pertinent items noted in HPI and remainder of comprehensive ROS otherwise negative.   PHYSICAL EXAM   Patient Vitals for the past 24 hrs:  BP Temp Temp src Pulse Resp SpO2 Height Weight  10/23/23 1207 135/80 98.1 F (36.7 C) Oral 94 20 98 % 5' 6 (1.676 m) 121.7 kg  Constitutional: Well-developed, well-nourished female in no acute distress.  HEENT: atraumatic, normocephalic. Neck has normal ROM. EOM intact. Cardiovascular: normal rate & rhythm, warm and well-perfused Respiratory: normal effort, no problems with respiration noted GI: Abd soft, non-tender, non-distended MSK: Extremities nontender, no edema, normal ROM Skin: warm and dry. Acyanotic, no jaundice or pallor. Neurologic: Alert and oriented x 4. No abnormal coordination. Psychiatric: Normal mood. Speech not slurred, not rapid/pressured. Patient is cooperative. GU: no CVA tenderness Pelvic exam: VULVA: normal appearing vulva with no masses, tenderness or lesions, VAGINA: normal appearing vagina with normal color and discharge, no lesions, CERVIX: multiparous os, no pooling, exam chaperoned by Ronal Eriksson RN.  Cervical exams by Ronal Eriksson RN  Dilation: 2 Effacement (%): 50 Cervical Position: Posterior Station: -3 Presentation: Vertex Exam by:: MM RN  Fetal Tracing: Baseline FHR: 140 per minute Fetal heart variability: moderate Fetal Heart Rate accelerations: yes Fetal Heart Rate decelerations: none Fetal Non-stress Test: Category I (reactive) Toco: uterine contractions every 2-3 minutes  Labs: Results for orders placed or performed during the hospital encounter of 10/23/23 (from the past 24 hours)  Fern Test     Status: None   Collection Time: 10/23/23 12:47 PM  Result Value Ref Range   POCT Fern Test Negative = intact amniotic membranes   Urinalysis, Routine w reflex microscopic -Urine, Clean Catch     Status: Abnormal   Collection Time: 10/23/23 12:53 PM  Result Value Ref Range   Color, Urine YELLOW YELLOW   APPearance HAZY (A) CLEAR   Specific Gravity, Urine 1.024 1.005 - 1.030   pH 6.0 5.0 - 8.0    Glucose, UA NEGATIVE NEGATIVE mg/dL   Hgb urine dipstick NEGATIVE NEGATIVE   Bilirubin Urine NEGATIVE NEGATIVE   Ketones, ur NEGATIVE NEGATIVE mg/dL   Protein, ur 30 (A) NEGATIVE mg/dL   Nitrite NEGATIVE NEGATIVE   Leukocytes,Ua TRACE (A) NEGATIVE   RBC / HPF 0-5 0 - 5 RBC/hpf   WBC, UA 0-5 0 - 5 WBC/hpf   Bacteria, UA RARE (A) NONE SEEN   Squamous Epithelial / HPF 11-20 0 - 5 /HPF   Mucus PRESENT   Rupture of Membrane (ROM) Plus     Status: None   Collection Time: 10/23/23  1:20 PM  Result Value Ref Range   Rom Plus NEGATIVE     Imaging:  No results found.  MDM & MAU COURSE  MDM: High  MAU Course: -Vital signs within normal limits. -Initial cervical exam 1.5 cm dilated. -Negative fern, no pooling, collecting ROM Plus. -ROM Plus negative, no ROM. -Cervix unchanged on recheck. -IM morphine  for therapeutic rest. -Discussed with Dr. Erik who consulted with pharmacy. Recommend holding PM dose of heparin  if still contracting.  Orders Placed This Encounter  Procedures   Urinalysis, Routine w reflex microscopic -Urine, Clean Catch   Rupture of Membrane (ROM) Plus   Fern Test   Discharge patient   Meds ordered this encounter  Medications   morphine  (PF) 4 MG/ML injection 4 mg    Refill:  0    ASSESSMENT   1. False labor at or after 37 completed weeks of gestation   2. [redacted] weeks gestation of pregnancy     PLAN  Discharge home in stable condition with labor precautions.   If still contracting every 2-3 minutes in the morning, consider reevaluation in MAU for labor.  Allergies as of 10/23/2023       Reactions   Milk (cow) Anaphylaxis   Milk-related Compounds Anaphylaxis  Penicillins Hives, Anaphylaxis, Dermatitis   Has patient had a PCN reaction causing immediate rash, facial/tongue/throat swelling, SOB or lightheadedness with hypotension: Yes Has patient had a PCN reaction causing severe rash involving mucus membranes or skin necrosis: No Has patient had a PCN  reaction that required hospitalization: No Has patient had a PCN reaction occurring within the last 10 years: No If all of the above answers are NO, then may proceed with Cephalosporin use. Has patient had a PCN reaction causing immediate rash, facial/tongue/throat swelling, SOB or lightheadedness with hypotension: Yes Has patient had a PCN reaction causing severe rash involving mucus membranes or skin necrosis: No Has patient had a PCN reaction that required hospitalization: No Has patient had a PCN reaction occurring within the last 10 years: No If all of the above answers are NO, then may proceed with Cephalosporin use.   Latex Hives, Dermatitis   Lorazepam Other (See Comments)   PANIC   Metoclopramide  Other (See Comments)   Panic attack   Promethazine     Other Reaction(s): Fever Panic attack Other Reaction(s): Fever    Panic attack   Promethazine  Hcl    Other Reaction(s): Fever Panic attack   Phenergan  [promethazine  Hcl]    Panic attack        Medication List     TAKE these medications    acetaminophen  325 MG tablet Commonly known as: TYLENOL  Take 650 mg by mouth daily as needed for pain.   albuterol  108 (90 Base) MCG/ACT inhaler Commonly known as: VENTOLIN  HFA Inhale 2 puffs into the lungs every 6 (six) hours as needed for wheezing or shortness of breath.   ARIPiprazole  10 MG tablet Commonly known as: Abilify  Take 1 tablet (10 mg total) by mouth daily.   Blood Pressure Kit Devi 1 Device by Does not apply route once a week.   Breztri  Aerosphere 160-9-4.8 MCG/ACT Aero inhaler Generic drug: budesonide-glycopyrrolate-formoterol Inhale 2 puffs into the lungs in the morning and at bedtime.   calcium  carbonate 750 MG chewable tablet Commonly known as: TUMS EX Chew 1 tablet by mouth at bedtime as needed (for nausea).   cyclobenzaprine  10 MG tablet Commonly known as: FLEXERIL  Take 1 tablet (10 mg total) by mouth 2 (two) times daily as needed for muscle spasms.    guaiFENesin -dextromethorphan 100-10 MG/5ML syrup Commonly known as: ROBITUSSIN DM Take 5 mLs by mouth every 4 (four) hours as needed for cough.   heparin  10000 UNIT/ML injection Inject 1 mL (10,000 Units total) into the skin every 12 (twelve) hours.   hydrOXYzine  25 MG tablet Commonly known as: ATARAX  Take 1 tablet (25 mg total) by mouth every 6 (six) hours.   multivitamin with minerals tablet Take 1 tablet by mouth daily.   nicotine  14 mg/24hr patch Commonly known as: NICODERM CQ  - dosed in mg/24 hours Place 1 patch (14 mg total) onto the skin daily.   sucralfate  1 GM/10ML suspension Commonly known as: Carafate  Take 10 mLs (1 g total) by mouth 4 (four) times daily -  with meals and at bedtime.   Vitafol  Gummies 3.33-0.333-34.8 MG Chew CHEW 1 TABLET BY MOUTH EVERY DAY         Joesph DELENA Sear, PA

## 2023-10-24 ENCOUNTER — Encounter: Admitting: Obstetrics and Gynecology

## 2023-10-24 ENCOUNTER — Ambulatory Visit: Admitting: Obstetrics and Gynecology

## 2023-10-24 ENCOUNTER — Encounter: Payer: Self-pay | Admitting: Obstetrics and Gynecology

## 2023-10-24 VITALS — BP 121/86 | HR 74 | Wt 270.1 lb

## 2023-10-24 DIAGNOSIS — Z8751 Personal history of pre-term labor: Secondary | ICD-10-CM

## 2023-10-24 DIAGNOSIS — O0990 Supervision of high risk pregnancy, unspecified, unspecified trimester: Secondary | ICD-10-CM

## 2023-10-24 DIAGNOSIS — O99343 Other mental disorders complicating pregnancy, third trimester: Secondary | ICD-10-CM

## 2023-10-24 DIAGNOSIS — Z3A37 37 weeks gestation of pregnancy: Secondary | ICD-10-CM | POA: Diagnosis not present

## 2023-10-24 DIAGNOSIS — Z3490 Encounter for supervision of normal pregnancy, unspecified, unspecified trimester: Secondary | ICD-10-CM

## 2023-10-24 DIAGNOSIS — Z6841 Body Mass Index (BMI) 40.0 and over, adult: Secondary | ICD-10-CM | POA: Diagnosis not present

## 2023-10-24 DIAGNOSIS — F319 Bipolar disorder, unspecified: Secondary | ICD-10-CM

## 2023-10-24 NOTE — Progress Notes (Signed)
 PRENATAL VISIT NOTE  Subjective:  Taylor Buck is a 33 y.o. [id number] at [redacted]w[redacted]d being seen today for ongoing prenatal care.  She is currently monitored for the following issues for this high-risk pregnancy and has History of preterm delivery; Bipolar 1 disorder (HCC); History of substance use disorder; Trichomonas vaginitis; Asthma; Family history of breast cancer; Nicotine  dependence, cigarettes, uncomplicated; Obstructive sleep apnea; PCOS (polycystic ovarian syndrome); Posttraumatic stress disorder; Supervision of high risk pregnancy, antepartum; Cystic fibrosis carrier; Pathological nipple discharge; BMI 40.0-44.9, adult (HCC); Abnormal glucose tolerance test; TIA (transient ischemic attack); Anxiety disorder affecting pregnancy, antepartum; Superficial venous thrombosis of arm, left; Plantar fasciitis, bilateral; Nausea and vomiting in pregnancy; Vision loss; and Thrombosis on their problem list.  Patient doing very upset with continuing contractions. She reports contractions and increased fatigue.  Contractions: Regular. Vag. Bleeding: None.  Movement: Increased. Intermittent leaking of fluid per patient, but had full workup yesterday which was all negative.   The following portions of the patient's history were reviewed and updated as appropriate: allergies, current medications, past family history, past medical history, past social history, past surgical history and problem list. Problem list updated.  Objective:   Vitals:   10/24/23 0920  BP: 121/86  Pulse: 74  Weight: 270 lb 1.6 oz (122.5 kg)    Fetal Status: Fetal Heart Rate (bpm): 150 (Simultaneous filing. User may not have seen previous data.)   Movement: Increased     General:  Alert, oriented and cooperative. Patient is in no acute distress.  Skin: Skin is warm and dry. No rash noted.   Cardiovascular: Normal heart rate noted  Respiratory: Normal respiratory effort, no problems with respiration noted  Abdomen:  Soft, gravid, appropriate for gestational age.  Pain/Pressure: Present     Pelvic: Cervical exam performed Dilation: 2 Effacement (%): 60 Station: -3  Extremities: Normal range of motion.  Edema: Trace  Mental Status:  Normal mood and affect. Normal behavior. Normal judgment and thought content.   Assessment and Plan:  Pregnancy: [id number] at [redacted]w[redacted]d  1. [redacted] weeks gestation of pregnancy (Primary)   2. Supervision of high risk pregnancy, antepartum Continue routine prenatal care Will schedule IOL at 39 weeks Pt very upset and wants to be delivered because she is sore and tired.  She is also concerned about restarting her prophylactic heparin  because she does not want to increase bleeding.  Pt stated  I will not restart the heparin  until I have the baby.  Discussed patient in detail with MFM [facility].  Chart reviewed, unfortunately there is no reason to induce labor at this time.  Pt advised since she has had no cervical change since yesterday, she can restart heparin  and then stop if there is ROM or change in contraction frequency/intensity.  Pt inquired why we do not stop contractions after 34 weeks.  Explained it is against guidelines and that we hope at some point she will have cervical change and progress into valid labor.  Pt advised to return to MAU as much as needed for evaluation to see if she is in labor.  3. BMI 40.0-44.9, adult (HCC)   4. Bipolar 1 disorder (HCC) Pt very emotional and upset today  5. History of preterm delivery   Term labor symptoms and general obstetric precautions including but not limited to vaginal bleeding, contractions, leaking of fluid and fetal movement were reviewed in detail with the patient.  Please refer to After Visit Summary for other counseling recommendations.   Return in about 1  week (around 10/31/2023) for in person.   Jerilynn Buddle, MD Faculty Attending Center for [facility]

## 2023-10-24 NOTE — Progress Notes (Signed)
 Contractions every two minutes since 10 AM yesterday morning. Emotional and tired.   MFM sent pt to MAU yesterday.  Clear and watery dc.   Pt did not take heparin  dose last night or this morning. Last dose was 0700 yesterday morning.   Wishing to have cervix check

## 2023-10-27 NOTE — Progress Notes (Unsigned)
   PRENATAL VISIT NOTE  Subjective:  Taylor Buck is a 33 y.o. H4E8877 at [redacted]w[redacted]d being seen today for ongoing prenatal care.  She is currently monitored for the following issues for this high-risk pregnancy and has History of preterm delivery; Bipolar 1 disorder (HCC); History of substance use disorder; Trichomonas vaginitis; Asthma; Family history of breast cancer; Nicotine  dependence, cigarettes, uncomplicated; Obstructive sleep apnea; PCOS (polycystic ovarian syndrome); Posttraumatic stress disorder; Supervision of high risk pregnancy, antepartum; Cystic fibrosis carrier; Pathological nipple discharge; BMI 40.0-44.9, adult (HCC); Abnormal glucose tolerance test; TIA (transient ischemic attack); Anxiety disorder affecting pregnancy, antepartum; Superficial venous thrombosis of arm, left; Plantar fasciitis, bilateral; Nausea and vomiting in pregnancy; Vision loss; and Thrombosis on their problem list.  Patient reports {[id number]}.   .  .   . Denies leaking of fluid.   The following portions of the patient's history were reviewed and updated as appropriate: allergies, current medications, past family history, past medical history, past social history, past surgical history and problem list.   Objective:    There were no vitals filed for this visit.  Fetal Status:           General: Alert, oriented and cooperative. Patient is in no acute distress.  Skin: Skin is warm and dry. No rash noted.   Cardiovascular: Normal heart rate noted  Respiratory: Normal respiratory effort, no problems with respiration noted  Abdomen: Soft, gravid, appropriate for gestational age.        Pelvic: Cervical exam deferred        Extremities: Normal range of motion.     Mental Status: Normal mood and affect. Normal behavior. Normal judgment and thought content.   Assessment and Plan:  Pregnancy: [id number] at [redacted]w[redacted]d 1. Supervision of high risk pregnancy, antepartum (Primary) - Doing well, feeling  regular and vigorous fetal movement   2. [redacted] weeks gestation of pregnancy - Routine PNC, anticipatory guidance.  - Has IOL 11/06/23  3. History of preterm delivery - Previous delivery.   4. TIA (transient ischemic attack) - Transitioned to heparin  at 36 weeks  5. Mild intermittent asthma without complication ***  Term labor symptoms and general obstetric precautions including but not limited to vaginal bleeding, contractions, leaking of fluid and fetal movement were reviewed in detail with the patient. Please refer to After Visit Summary for other counseling recommendations.   No follow-ups on file.  Future Appointments  Date Time Provider Department Center  10/28/2023  1:50 PM Regino Camie LABOR, CNM [facility] None  10/31/2023  8:00 PM Neysa Reggy JONETTA, MD [facility]  11/04/2023  2:00 PM [facility] PROVIDER 1 [facility]  11/04/2023  2:15 PM [facility] US4 [facility]  11/06/2023  7:15 AM [facility] SCHED ROOM [facility] None  12/30/2023 10:00 AM [facility] PHLEBOTOMIST [facility] None  12/30/2023 10:20 AM Cloretta Arley NOVAK, MD [facility] None    Camie LABOR Regino, CNM

## 2023-10-28 ENCOUNTER — Ambulatory Visit (INDEPENDENT_AMBULATORY_CARE_PROVIDER_SITE_OTHER): Admitting: Certified Nurse Midwife

## 2023-10-28 VITALS — BP 134/90 | HR 94 | Wt 274.0 lb

## 2023-10-28 DIAGNOSIS — Z8751 Personal history of pre-term labor: Secondary | ICD-10-CM | POA: Diagnosis not present

## 2023-10-28 DIAGNOSIS — J4520 Mild intermittent asthma, uncomplicated: Secondary | ICD-10-CM

## 2023-10-28 DIAGNOSIS — Z3A37 37 weeks gestation of pregnancy: Secondary | ICD-10-CM | POA: Diagnosis not present

## 2023-10-28 DIAGNOSIS — G459 Transient cerebral ischemic attack, unspecified: Secondary | ICD-10-CM

## 2023-10-28 DIAGNOSIS — O0990 Supervision of high risk pregnancy, unspecified, unspecified trimester: Secondary | ICD-10-CM | POA: Diagnosis not present

## 2023-10-28 NOTE — Progress Notes (Signed)
 Swelling in hands and feet at times. Had stronger contractions day ago but none now.

## 2023-10-28 NOTE — Patient Instructions (Signed)
 Things to Try After 37 weeks to Encourage Labor/Get Ready for Labor:    Try the [employer] at [email or web address].[email or web address] daily to improve baby's position and encourage the onset of labor.  Walk a little and rest a little every day.  Change positions often.  Cervical Ripening: May try one or both Red Raspberry Leaf capsules or tea:  two 300mg  or 400mg  tablets with each meal, 2-3 times a day, or 1-3 cups of tea daily  Potential Side Effects Of Raspberry Leaf:  Most women do not experience any side effects from drinking raspberry leaf tea. However, nausea and loose stools are possible.  Evening Primrose Oil capsules: take 1 capsule by mouth and place one capsule in the vagina every night.    Some of the potential side effects:  Upset stomach  Loose stools or diarrhea  Headaches  Nausea  Sex can also help the cervix ripen and encourage labor onset.  5. Eating 6-8 dates per day can help soften and even dilate your cervix. You may eat them plain, in smoothies, or in energy balls.  They do contain sugar so eat with caution and balance with protein.

## 2023-10-30 ENCOUNTER — Inpatient Hospital Stay (HOSPITAL_COMMUNITY)
Admission: AD | Admit: 2023-10-30 | Discharge: 2023-10-30 | Disposition: A | Discharge status: Home / Self Care | Attending: Obstetrics and Gynecology | Admitting: Obstetrics and Gynecology

## 2023-10-30 ENCOUNTER — Encounter (HOSPITAL_COMMUNITY): Payer: Self-pay | Admitting: Obstetrics and Gynecology

## 2023-10-30 DIAGNOSIS — Z91128 Patient's intentional underdosing of medication regimen for other reason: Secondary | ICD-10-CM | POA: Diagnosis not present

## 2023-10-30 DIAGNOSIS — F419 Anxiety disorder, unspecified: Secondary | ICD-10-CM | POA: Diagnosis not present

## 2023-10-30 DIAGNOSIS — Z8673 Personal history of transient ischemic attack (TIA), and cerebral infarction without residual deficits: Secondary | ICD-10-CM | POA: Diagnosis not present

## 2023-10-30 DIAGNOSIS — Z3A38 38 weeks gestation of pregnancy: Secondary | ICD-10-CM

## 2023-10-30 DIAGNOSIS — O99343 Other mental disorders complicating pregnancy, third trimester: Secondary | ICD-10-CM | POA: Diagnosis not present

## 2023-10-30 DIAGNOSIS — Z7901 Long term (current) use of anticoagulants: Secondary | ICD-10-CM | POA: Diagnosis not present

## 2023-10-30 DIAGNOSIS — T45516A Underdosing of anticoagulants, initial encounter: Secondary | ICD-10-CM | POA: Diagnosis not present

## 2023-10-30 DIAGNOSIS — O471 False labor at or after 37 completed weeks of gestation: Secondary | ICD-10-CM | POA: Diagnosis present

## 2023-10-30 DIAGNOSIS — O479 False labor, unspecified: Secondary | ICD-10-CM | POA: Diagnosis not present

## 2023-10-30 MED ORDER — LACTATED RINGERS IV BOLUS: 1000.0000 mL | Freq: Once | INTRAVENOUS | Status: DC

## 2023-10-30 MED ORDER — HYDROXYZINE HCL 25 MG PO TABS
25.0000 mg | ORAL_TABLET | Freq: Three times a day (TID) | ORAL | Status: DC | PRN
Start: 2023-10-30 — End: 2023-10-30
  Administered 2023-10-30: 25 mg via ORAL
  Filled 2023-10-30 (×2): qty 1

## 2023-10-30 MED ORDER — FENTANYL CITRATE (PF) 100 MCG/2ML IJ SOLN
100.0000 ug | INTRAMUSCULAR | Status: DC | PRN
Start: 2023-10-30 — End: 2023-10-30

## 2023-10-30 NOTE — Discharge Instructions (Signed)
 It was a pleasure taking care of you today.  If your pain worsens or your contractions become stronger please return for further evaluation.  If you notice any leaking of fluid or bleeding also return.  Please consider taking your heparin  and if you have any worries please reach out to your primary OB provider.  I have you have a great rest of your day!

## 2023-10-30 NOTE — MAU Note (Signed)
 Provider requests this rn to ask pt if she would like ivf's and something for pain. Pt again expresses confusion about her heparin . Claudene, CNM speaks with provider in [place]au to see pt

## 2023-10-30 NOTE — MAU Note (Signed)
 Taylor Buck is a 33 y.o. at [redacted]w[redacted]d here in MAU reporting: contractions are very strong, about 2 hrs now.  Every 2 min. No bleeding or LOF.  Onset of complaint: ~about 2 hrs Pain score: 6 Vitals:   10/30/23 1310  BP: 122/81  Pulse: (!) 101  Resp: (!) 22  Temp: 97.9 F (36.6 C)  SpO2: 99%     FHT:145 Lab orders placed from triage:

## 2023-10-30 NOTE — MAU Provider Note (Signed)
 History     CSN: [id number]  Arrival date and time: 10/30/23 1253   None     Chief Complaint  Patient presents with   Contractions   HPI Patient presenting for labor check.  Was having regular contractions every 2 minutes but also having considerable anxiety.  Patient reports a lot of anxiety around concerns about delivering at home.  She notes that she supposed to be on heparin  right now but is afraid to give herself shots so has not been taking it.  Is also worried about having a stroke. OB History     Gravida  5   Para  2   Term  1   Preterm  1   AB  2   Living  2      SAB  2   IAB  0   Ectopic  0   Multiple  0   Live Births  2           Past Medical History:  Diagnosis Date   Anxiety    Arthritis    Asthma    inhaler laat used today   Depression    Heart murmur    Infection    chlamydia age 89   PCOS (polycystic ovarian syndrome)    Sleep apnea    TIA (transient ischemic attack) 2025   Urinary tract infection     Past Surgical History:  Procedure Laterality Date   CHOLECYSTECTOMY     HAND SURGERY     reconstruction- injury due to frostbite as child   HERNIA REPAIR     rt inguinal    Family History  Problem Relation Age of Onset   Asthma Mother    COPD Mother    Diabetes Maternal Aunt    Cancer Maternal Aunt        breast   Diabetes Maternal Grandmother    Cancer Maternal Grandmother        skin cancer   Alcoholism Other    Arthritis Other    Asthma Other    Depression Other    Hearing loss Neg Hx     Social History   Tobacco Use   Smoking status: Former    Current packs/day: 0.00    Types: Cigarettes    Quit date: 10/2022    Years since quitting: 1.0    Passive exposure: Current   Smokeless tobacco: Former   Tobacco comments:    1 PACK A DAY - 4 CIGARETTES A DAY; nicotine  patches now  Vaping Use   Vaping status: Never Used  Substance Use Topics   Alcohol use: No   Drug use: Not Currently    Types: Marijuana     Comment: UP UNTIL +UPT    Allergies:  Allergies  Allergen Reactions   Milk (Cow) Anaphylaxis   Milk-Related Compounds Anaphylaxis   Penicillins Hives, Anaphylaxis and Dermatitis    Has patient had a PCN reaction causing immediate rash, facial/tongue/throat swelling, SOB or lightheadedness with hypotension: Yes  Has patient had a PCN reaction causing severe rash involving mucus membranes or skin necrosis: No  Has patient had a PCN reaction that required hospitalization: No  Has patient had a PCN reaction occurring within the last 10 years: No  If all of the above answers are NO, then may proceed with Cephalosporin use.  Has patient had a PCN reaction causing immediate rash, facial/tongue/throat swelling, SOB or lightheadedness with hypotension: Yes Has patient had a PCN reaction causing severe rash involving  mucus membranes or skin necrosis: No Has patient had a PCN reaction that required hospitalization: No Has patient had a PCN reaction occurring within the last 10 years: No If all of the above answers are NO, then may proceed with Cephalosporin use.   Latex Hives and Dermatitis   Lorazepam Other (See Comments)    PANIC   Metoclopramide  Other (See Comments)    Panic attack   Promethazine      Other Reaction(s): Fever  Panic attack  Other Reaction(s): Fever    Panic attack   Promethazine  Hcl     Other Reaction(s): Fever  Panic attack   Phenergan  [Promethazine  Hcl]     Panic attack    Medications Prior to Admission  Medication Sig Dispense Refill Last Dose/Taking   acetaminophen  (TYLENOL ) 325 MG tablet Take 650 mg by mouth daily as needed for pain.   10/30/2023   albuterol  (VENTOLIN  HFA) 108 (90 Base) MCG/ACT inhaler Inhale 2 puffs into the lungs every 6 (six) hours as needed for wheezing or shortness of breath.   10/30/2023   ARIPiprazole  (ABILIFY ) 10 MG tablet Take 1 tablet (10 mg total) by mouth daily. 30 tablet 11 10/30/2023   cyclobenzaprine  (FLEXERIL ) 10 MG  tablet Take 1 tablet (10 mg total) by mouth 2 (two) times daily as needed for muscle spasms. 20 tablet 3 Past Week   hydrOXYzine  (ATARAX ) 25 MG tablet Take 1 tablet (25 mg total) by mouth every 6 (six) hours. 30 tablet 3 Past Week   Prenatal Vit-Fe Phos-FA-Omega (VITAFOL  GUMMIES) 3.33-0.333-34.8 MG CHEW CHEW 1 TABLET BY MOUTH EVERY DAY 90 tablet 5 10/29/2023   sucralfate  (CARAFATE ) 1 GM/10ML suspension Take 10 mLs (1 g total) by mouth 4 (four) times daily -  with meals and at bedtime. 420 mL 0 10/29/2023   Blood Pressure Monitoring (BLOOD PRESSURE KIT) DEVI 1 Device by Does not apply route once a week. 1 each 0    budesonide-glycopyrrolate-formoterol (BREZTRI  AEROSPHERE) 160-9-4.8 MCG/ACT AERO inhaler Inhale 2 puffs into the lungs in the morning and at bedtime. (Patient not taking: Reported on 10/17/2023) 10.7 g 5    calcium  carbonate (TUMS EX) 750 MG chewable tablet Chew 1 tablet by mouth at bedtime as needed (for nausea).      guaiFENesin -dextromethorphan (ROBITUSSIN DM) 100-10 MG/5ML syrup Take 5 mLs by mouth every 4 (four) hours as needed for cough. 118 mL 0    heparin  10000 UNIT/ML injection Inject 1 mL (10,000 Units total) into the skin every 12 (twelve) hours. (Patient not taking: No sig reported) 5 mL 20 10/23/2023   Multiple Vitamins-Minerals (MULTIVITAMIN WITH MINERALS) tablet Take 1 tablet by mouth daily.       Review of Systems  Gastrointestinal:  Positive for abdominal pain.  Genitourinary:  Positive for vaginal pain. Negative for vaginal bleeding.  Psychiatric/Behavioral:  Negative for self-injury and suicidal ideas. The patient is nervous/anxious.    Physical Exam   Blood pressure 122/81, pulse (!) 101, temperature 97.9 F (36.6 C), temperature source Oral, resp. rate (!) 22, height 5' 6 (1.676 m), weight 123.3 kg, last menstrual period 02/06/2023, SpO2 99%.  Physical Exam Vitals and nursing note reviewed.  Constitutional:      Appearance: Normal appearance.  HENT:     Head:  Normocephalic and atraumatic.     Nose: No congestion or rhinorrhea.  Eyes:     Extraocular Movements: Extraocular movements intact.  Cardiovascular:     Rate and Rhythm: Normal rate.  Pulmonary:     Effort: Pulmonary  effort is normal.  Abdominal:     Palpations: Abdomen is soft.     Tenderness: There is no abdominal tenderness.  Musculoskeletal:        General: Normal range of motion.     Cervical back: Normal range of motion.  Skin:    General: Skin is warm.     Capillary Refill: Capillary refill takes less than 2 seconds.  Neurological:     General: No focal deficit present.     Mental Status: She is alert.     Cranial Nerves: No cranial nerve deficit.  Psychiatric:        Mood and Affect: Mood is anxious.        Thought Content: Thought content does not include homicidal or suicidal ideation.     Comments: Intermittently crying throughout the visit     MAU Course  Procedures  MDM NST-145 bpm, accelerations, no D cells, difficulty tracking contractions SVE  Assessment and Plan  Taylor Buck is a 33 yo [id number] @ [redacted]w[redacted]d presenting for labor check but also having severe anxiety.  Labor check Difficulty tracking contractions, NST was reassuring.  SVE unchanged after several hours.  Discussed return precautions and patient discharged home.  Anxiety Patient with considerable anxiety.  Given Atarax  and symptoms improved.  Patient has prescription for Atarax  at home and will take as needed.  History of TIA Recommended Lovenox  and then transition to 2 heparin .  Patient has not been taking the heparin  out of fear of going into labor and bleeding.  Discussed that we recommended to prevent stroke and patient will consider taking it.  Plan for induction at 39 weeks and patient will discontinue heparin  24 hours before induction.  Taylor Buck 10/30/2023, 5:29 PM

## 2023-10-31 ENCOUNTER — Ambulatory Visit (HOSPITAL_BASED_OUTPATIENT_CLINIC_OR_DEPARTMENT_OTHER): Admitting: Internal Medicine

## 2023-10-31 VITALS — Ht 66.0 in | Wt 270.0 lb

## 2023-10-31 DIAGNOSIS — G4733 Obstructive sleep apnea (adult) (pediatric): Secondary | ICD-10-CM | POA: Diagnosis present

## 2023-11-03 ENCOUNTER — Inpatient Hospital Stay (HOSPITAL_COMMUNITY)
Admission: AD | Admit: 2023-11-03 | Discharge: 2023-11-05 | DRG: 806 | Disposition: A | Discharge status: Home / Self Care | Attending: Obstetrics and Gynecology | Admitting: Obstetrics and Gynecology

## 2023-11-03 ENCOUNTER — Other Ambulatory Visit: Payer: Self-pay

## 2023-11-03 ENCOUNTER — Encounter (HOSPITAL_COMMUNITY): Payer: Self-pay | Admitting: Obstetrics and Gynecology

## 2023-11-03 ENCOUNTER — Encounter (HOSPITAL_COMMUNITY): Payer: Self-pay

## 2023-11-03 DIAGNOSIS — O26893 Other specified pregnancy related conditions, third trimester: Secondary | ICD-10-CM | POA: Diagnosis present

## 2023-11-03 DIAGNOSIS — Z3A38 38 weeks gestation of pregnancy: Secondary | ICD-10-CM | POA: Diagnosis not present

## 2023-11-03 DIAGNOSIS — F319 Bipolar disorder, unspecified: Secondary | ICD-10-CM | POA: Diagnosis present

## 2023-11-03 DIAGNOSIS — O4292 Full-term premature rupture of membranes, unspecified as to length of time between rupture and onset of labor: Principal | ICD-10-CM | POA: Diagnosis present

## 2023-11-03 DIAGNOSIS — J45909 Unspecified asthma, uncomplicated: Secondary | ICD-10-CM | POA: Diagnosis present

## 2023-11-03 DIAGNOSIS — F419 Anxiety disorder, unspecified: Secondary | ICD-10-CM | POA: Diagnosis present

## 2023-11-03 DIAGNOSIS — O99334 Smoking (tobacco) complicating childbirth: Secondary | ICD-10-CM | POA: Diagnosis not present

## 2023-11-03 DIAGNOSIS — F4310 Post-traumatic stress disorder, unspecified: Secondary | ICD-10-CM | POA: Diagnosis present

## 2023-11-03 DIAGNOSIS — I82612 Acute embolism and thrombosis of superficial veins of left upper extremity: Secondary | ICD-10-CM | POA: Diagnosis present

## 2023-11-03 DIAGNOSIS — O9952 Diseases of the respiratory system complicating childbirth: Secondary | ICD-10-CM | POA: Diagnosis present

## 2023-11-03 DIAGNOSIS — O99344 Other mental disorders complicating childbirth: Secondary | ICD-10-CM | POA: Diagnosis present

## 2023-11-03 DIAGNOSIS — Z79899 Other long term (current) drug therapy: Secondary | ICD-10-CM

## 2023-11-03 DIAGNOSIS — Z23 Encounter for immunization: Secondary | ICD-10-CM

## 2023-11-03 DIAGNOSIS — G4733 Obstructive sleep apnea (adult) (pediatric): Secondary | ICD-10-CM | POA: Diagnosis present

## 2023-11-03 DIAGNOSIS — Z3490 Encounter for supervision of normal pregnancy, unspecified, unspecified trimester: Secondary | ICD-10-CM

## 2023-11-03 DIAGNOSIS — O2223 Superficial thrombophlebitis in pregnancy, third trimester: Secondary | ICD-10-CM | POA: Diagnosis present

## 2023-11-03 DIAGNOSIS — Z87891 Personal history of nicotine dependence: Secondary | ICD-10-CM | POA: Diagnosis not present

## 2023-11-03 DIAGNOSIS — Z87898 Personal history of other specified conditions: Secondary | ICD-10-CM

## 2023-11-03 DIAGNOSIS — Z880 Allergy status to penicillin: Secondary | ICD-10-CM

## 2023-11-03 DIAGNOSIS — Z6841 Body Mass Index (BMI) 40.0 and over, adult: Secondary | ICD-10-CM

## 2023-11-03 DIAGNOSIS — Z833 Family history of diabetes mellitus: Secondary | ICD-10-CM

## 2023-11-03 DIAGNOSIS — Z8673 Personal history of transient ischemic attack (TIA), and cerebral infarction without residual deficits: Principal | ICD-10-CM

## 2023-11-03 DIAGNOSIS — Z141 Cystic fibrosis carrier: Secondary | ICD-10-CM

## 2023-11-03 LAB — CBC
HCT: 42.2 % (ref 36.0–46.0)
Hemoglobin: 13.5 g/dL (ref 12.0–15.0)
MCH: 24.9 pg — ABNORMAL LOW (ref 26.0–34.0)
MCHC: 32 g/dL (ref 30.0–36.0)
MCV: 77.7 fL — ABNORMAL LOW (ref 80.0–100.0)
Platelets: 349 K/uL (ref 150–400)
RBC: 5.43 MIL/uL — ABNORMAL HIGH (ref 3.87–5.11)
RDW: 15.9 % — ABNORMAL HIGH (ref 11.5–15.5)
WBC: 11.5 K/uL — ABNORMAL HIGH (ref 4.0–10.5)
nRBC: 0 % (ref 0.0–0.2)

## 2023-11-03 LAB — COMPREHENSIVE METABOLIC PANEL WITH GFR
ALT: 15 U/L (ref 0–44)
AST: 13 U/L — ABNORMAL LOW (ref 15–41)
Albumin: 2.6 g/dL — ABNORMAL LOW (ref 3.5–5.0)
Alkaline Phosphatase: 148 U/L — ABNORMAL HIGH (ref 38–126)
Anion gap: 14 (ref 5–15)
BUN: 11 mg/dL (ref 6–20)
CO2: 22 mmol/L (ref 22–32)
Calcium: 9.5 mg/dL (ref 8.9–10.3)
Chloride: 101 mmol/L (ref 98–111)
Creatinine, Ser: 0.62 mg/dL (ref 0.44–1.00)
GFR, Estimated: >60 mL/min (ref >60)
Glucose, Bld: 94 mg/dL (ref 70–99)
Potassium: 3.6 mmol/L (ref 3.5–5.1)
Sodium: 137 mmol/L (ref 135–145)
Total Bilirubin: 0.4 mg/dL (ref 0.0–1.2)
Total Protein: 6.6 g/dL (ref 6.5–8.1)

## 2023-11-03 LAB — TYPE AND SCREEN
ABO/RH(D): A POS
Antibody Screen: NEGATIVE

## 2023-11-03 LAB — POCT FERN TEST: POCT Fern Test: POSITIVE

## 2023-11-03 MED ORDER — OXYCODONE-ACETAMINOPHEN 5-325 MG PO TABS: 1.0000 | ORAL_TABLET | ORAL | Status: DC | PRN

## 2023-11-03 MED ORDER — LACTATED RINGERS IV SOLN: 500.0000 mL | INTRAVENOUS | Status: DC | PRN

## 2023-11-03 MED ORDER — EPHEDRINE 5 MG/ML INJ: 10.0000 mg | INTRAVENOUS | Status: DC | PRN

## 2023-11-03 MED ORDER — OXYTOCIN-SODIUM CHLORIDE 30-0.9 UT/500ML-% IV SOLN: 1.0000 m[IU]/min | INTRAVENOUS | Status: DC

## 2023-11-03 MED ORDER — EPHEDRINE 5 MG/ML INJ
10.0000 mg | INTRAVENOUS | Status: DC | PRN
Start: 2023-11-03 — End: 2023-11-04
  Filled 2023-11-03: qty 5

## 2023-11-03 MED ORDER — PHENYLEPHRINE 80 MCG/ML (10ML) SYRINGE FOR IV PUSH (FOR BLOOD PRESSURE SUPPORT)
80.0000 ug | PREFILLED_SYRINGE | INTRAVENOUS | Status: DC | PRN
  Filled 2023-11-03: qty 10

## 2023-11-03 MED ORDER — LACTATED RINGERS IV SOLN: INTRAVENOUS | Status: DC

## 2023-11-03 MED ORDER — ARIPIPRAZOLE 10 MG PO TABS
10.0000 mg | ORAL_TABLET | Freq: Every day | ORAL | Status: DC
  Administered 2023-11-04 – 2023-11-05 (×2): 10 mg via ORAL
  Filled 2023-11-03 (×2): qty 1

## 2023-11-03 MED ORDER — PANTOPRAZOLE SODIUM 40 MG PO TBEC: 40.0000 mg | DELAYED_RELEASE_TABLET | Freq: Every day | ORAL | Status: DC

## 2023-11-03 MED ORDER — BUDESON-GLYCOPYRROL-FORMOTEROL 160-9-4.8 MCG/ACT IN AERO
2.0000 | INHALATION_SPRAY | Freq: Every day | RESPIRATORY_TRACT | Status: DC
  Administered 2023-11-04: 2 via RESPIRATORY_TRACT
  Filled 2023-11-03: qty 5.9

## 2023-11-03 MED ORDER — MISOPROSTOL 25 MCG QUARTER TABLET: 25.0000 ug | ORAL_TABLET | Freq: Once | ORAL | Status: DC

## 2023-11-03 MED ORDER — ALBUTEROL SULFATE (2.5 MG/3ML) 0.083% IN NEBU
2.5000 mg | INHALATION_SOLUTION | RESPIRATORY_TRACT | Status: DC | PRN
  Administered 2023-11-04 (×2): 2.5 mg via RESPIRATORY_TRACT
  Filled 2023-11-03 (×2): qty 3

## 2023-11-03 MED ORDER — OXYTOCIN-SODIUM CHLORIDE 30-0.9 UT/500ML-% IV SOLN
2.5000 [IU]/h | INTRAVENOUS | Status: DC
  Administered 2023-11-04: 2.5 [IU]/h via INTRAVENOUS

## 2023-11-03 MED ORDER — TERBUTALINE SULFATE 1 MG/ML IJ SOLN: 0.2500 mg | Freq: Once | INTRAMUSCULAR | Status: DC | PRN

## 2023-11-03 MED ORDER — MISOPROSTOL 50MCG HALF TABLET: 50.0000 ug | ORAL_TABLET | Freq: Once | ORAL | Status: DC

## 2023-11-03 MED ORDER — FENTANYL CITRATE (PF) 100 MCG/2ML IJ SOLN
50.0000 ug | INTRAMUSCULAR | Status: DC | PRN
  Administered 2023-11-04 (×2): 50 ug via INTRAVENOUS
  Filled 2023-11-03 (×2): qty 2

## 2023-11-03 MED ORDER — SOD CITRATE-CITRIC ACID 500-334 MG/5ML PO SOLN
30.0000 mL | ORAL | Status: DC | PRN
  Administered 2023-11-03: 30 mL via ORAL
  Filled 2023-11-03: qty 30

## 2023-11-03 MED ORDER — LACTATED RINGERS IV SOLN
500.0000 mL | Freq: Once | INTRAVENOUS | Status: AC
  Administered 2023-11-04: 500 mL via INTRAVENOUS

## 2023-11-03 MED ORDER — NICOTINE 7 MG/24HR TD PT24
7.0000 mg | MEDICATED_PATCH | Freq: Every day | TRANSDERMAL | Status: DC
  Administered 2023-11-04: 4.5 mg via TRANSDERMAL
  Administered 2023-11-05: 3.5 mg via TRANSDERMAL
  Filled 2023-11-03 (×2): qty 1

## 2023-11-03 MED ORDER — ACETAMINOPHEN 325 MG PO TABS: 650.0000 mg | ORAL_TABLET | ORAL | Status: DC | PRN

## 2023-11-03 MED ORDER — HYDROXYZINE HCL 25 MG PO TABS
25.0000 mg | ORAL_TABLET | ORAL | Status: DC | PRN
  Administered 2023-11-04 (×2): 25 mg via ORAL
  Filled 2023-11-03 (×2): qty 1

## 2023-11-03 MED ORDER — DIPHENHYDRAMINE HCL 50 MG/ML IJ SOLN: 12.5000 mg | INTRAMUSCULAR | Status: DC | PRN

## 2023-11-03 MED ORDER — ALBUTEROL SULFATE HFA 108 (90 BASE) MCG/ACT IN AERS: 2.0000 | INHALATION_SPRAY | RESPIRATORY_TRACT | Status: DC | PRN

## 2023-11-03 MED ORDER — ONDANSETRON HCL 4 MG/2ML IJ SOLN
4.0000 mg | Freq: Four times a day (QID) | INTRAMUSCULAR | Status: DC | PRN
  Administered 2023-11-03: 4 mg via INTRAVENOUS
  Filled 2023-11-03: qty 2

## 2023-11-03 MED ORDER — OXYTOCIN BOLUS FROM INFUSION
333.0000 mL | Freq: Once | INTRAVENOUS | Status: AC
  Administered 2023-11-04: 333 mL via INTRAVENOUS

## 2023-11-03 MED ORDER — PANTOPRAZOLE SODIUM 40 MG PO TBEC
40.0000 mg | DELAYED_RELEASE_TABLET | Freq: Every day | ORAL | Status: DC
Start: 2023-11-03 — End: 2023-11-05
  Administered 2023-11-04 (×2): 40 mg via ORAL
  Filled 2023-11-03 (×2): qty 1

## 2023-11-03 MED ORDER — LIDOCAINE HCL (PF) 1 % IJ SOLN: 30.0000 mL | INTRAMUSCULAR | Status: DC | PRN

## 2023-11-03 MED ORDER — OXYTOCIN-SODIUM CHLORIDE 30-0.9 UT/500ML-% IV SOLN
1.0000 m[IU]/min | INTRAVENOUS | Status: DC
  Administered 2023-11-03: 2 m[IU]/min via INTRAVENOUS
  Filled 2023-11-03: qty 500

## 2023-11-03 MED ORDER — PHENYLEPHRINE 80 MCG/ML (10ML) SYRINGE FOR IV PUSH (FOR BLOOD PRESSURE SUPPORT): 80.0000 ug | PREFILLED_SYRINGE | INTRAVENOUS | Status: DC | PRN

## 2023-11-03 MED ORDER — FENTANYL-BUPIVACAINE-NACL 0.5-0.125-0.9 MG/250ML-% EP SOLN
12.0000 mL/h | EPIDURAL | Status: DC | PRN
  Administered 2023-11-04: 12 mL/h via EPIDURAL
  Filled 2023-11-03: qty 250

## 2023-11-03 NOTE — H&P (Cosign Needed Addendum)
 OBSTETRIC ADMISSION HISTORY AND PHYSICAL  Taylor Buck is a 33 y.o. female [phone] with IUP at [redacted]w[redacted]d by LMP presenting for contractions and loss of clear fluid at 1950. She reports no VB, no blurry vision, headaches or peripheral edema, and RUQ pain.  Does not report feeling baby move but states this is normal for her. She plans on breast feeding. She request IUD Mirena , or BTL if CS for birth control.  [facility]   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  Rhogam  A/Positive/-- (03/20 0910)N/A A1C/GTT Early HgbA1C: 5.9 > normal 2h Third trimester 2 hr GTT:   Flu Vaccine        TDaP Vaccine  09/02/23 Blood Type A/Positive/-- (03/20 0910)  RSV Vaccine   Antibody Negative (03/20 0910)Negative (04/17/23)  COVID Vaccine   Rubella 8.92 (03/20 0910)  Feeding Plan  Breast RPR Non Reactive (08/05 9185)  Contraception  IUD in office, BTL if CS HBsAg Negative (03/20 0910)  Circumcision  Yes  HIV Non Reactive (08/05 0814)  Pediatrician  [facility] HCVAb Non Reactive (03/20 0910)  Prenatal Classes        BTL Consent   06/26/2023 Pap       Diagnosis  Date Value Ref Range Status  11/06/2017     Final    NEGATIVE FOR INTRAEPITHELIAL LESIONS OR MALIGNANCY.    BTL Pre-payment   GC/CT Initial:   36wks:    VBAC Consent   GBS Negative/-- (09/19 1154) For PCN allergy, check sensitivities   BRx Optimized? [ ]  yes   [x ] no      DME Rx [ x] BP cuff [ ]  Weight Scale  Waterbirth  [ ]  Class [ ]  Consent [ ]  CNM visit  PHQ9 & GAD7 [  x] new OB [  ] 28 weeks  [  ] 36 weeks Induction  [ ]  Orders Entered [ ] Foley Y/N    Past Medical History: Past Medical History:  Diagnosis Date   Anxiety    Arthritis    Asthma    inhaler laat used today   Depression    Heart murmur    Infection    chlamydia age 42   PCOS (polycystic ovarian syndrome)    Sleep apnea    TIA (transient ischemic attack) 2025   Urinary tract infection     Past Surgical History: Past Surgical History:  Procedure Laterality Date   CHOLECYSTECTOMY     HAND SURGERY     reconstruction- injury due to frostbite as child   HERNIA REPAIR     rt inguinal    Obstetrical History: OB History  Gravida Para Term Preterm AB Living  5 2 1 1 2 2   SAB IAB Ectopic Multiple Live Births  2 0  0 0 2    # Outcome Date GA Lbr Len/2nd Weight Sex Type Anes PTL Lv  5 Current           4 Term 05/27/18 [redacted]w[redacted]d 10:28 / 02:40 3100 g F Vag-Spont EPI  LIV  3 SAB 04/2017          2 Preterm 07/31/12 [redacted]w[redacted]d 07:00  M Vag-Spont None Y LIV  1 SAB 2008     SAB       Social History Social History   Socioeconomic History   Marital status: Single    Spouse name: Not on file   Number of children: Not on file   Years of education: Not on file   Highest education level: Not on file  Occupational History   Not on file  Tobacco Use   Smoking status: Former    Current packs/day: 0.00    Types: Cigarettes    Quit date: 10/2022    Years since quitting: 1.0    Passive exposure: Current   Smokeless tobacco: Former   Tobacco comments:    1 PACK A DAY - 4 CIGARETTES A DAY; nicotine  patches now  Vaping Use   Vaping status: Never Used  Substance and Sexual Activity   Alcohol use: No   Drug use: Not Currently    Types: Marijuana    Comment: UP UNTIL +UPT   Sexual activity: Not Currently    Birth control/protection: None  Other Topics Concern   Not on file  Social History Narrative   Not on file   Social  Drivers of Health   Financial Resource Strain: High Risk (07/02/2023)   Received from [facility]   Overall Financial Resource Strain (CARDIA)    Difficulty of Paying Living Expenses: Very hard  Food Insecurity: No Food Insecurity (07/02/2023)   [facility]   Hunger Vital Sign    Within the past 12 months, you worried that your food would run out before you got the money to buy more.: Never true    Within the past 12 months, the food you bought just didn't last and you didn't have money to get more.: Never true  Transportation Needs: No Transportation Needs (07/02/2023)   Received from [facility] - Transportation    Lack of Transportation (Medical): No    Lack of Transportation (Non-Medical): No  Physical Activity: Inactive (06/25/2023)   Exercise Vital Sign    Days of Exercise per Week: 0 days    Minutes of Exercise per Session: 0 min  Stress: Stress Concern Present (06/25/2023)   [employer] of Occupational Health - Occupational Stress Questionnaire    Feeling of Stress : To some extent  Social Connections: Socially Integrated (06/25/2023)   Social Connection and Isolation Panel    Frequency of Communication with Friends and Family: Twice a week    Frequency of Social Gatherings with Friends and Family: Twice a week    Attends Religious Services: 1 to 4 times per year    Active Member of [employer] or Organizations: No    Attends Engineer, structural: 1 to 4 times per year    Marital Status: Living with partner    Family History: Family History  Problem Relation Age of Onset   Asthma Mother    COPD Mother    Diabetes Maternal Aunt    Cancer Maternal Aunt        breast   Diabetes Maternal Grandmother    Cancer Maternal  Grandmother        skin cancer   Alcoholism Other    Arthritis Other    Asthma Other    Depression Other    Hearing loss Neg Hx     Allergies: Allergies  Allergen Reactions   Milk (Cow) Anaphylaxis   Milk-Related  Compounds Anaphylaxis   Penicillins Hives, Anaphylaxis and Dermatitis    Has patient had a PCN reaction causing immediate rash, facial/tongue/throat swelling, SOB or lightheadedness with hypotension: Yes  Has patient had a PCN reaction causing severe rash involving mucus membranes or skin necrosis: No  Has patient had a PCN reaction that required hospitalization: No  Has patient had a PCN reaction occurring within the last 10 years: No  If all of the above answers are NO, then may proceed with Cephalosporin use.  Has patient had a PCN reaction causing immediate rash, facial/tongue/throat swelling, SOB or lightheadedness with hypotension: Yes Has patient had a PCN reaction causing severe rash involving mucus membranes or skin necrosis: No Has patient had a PCN reaction that required hospitalization: No Has patient had a PCN reaction occurring within the last 10 years: No If all of the above answers are NO, then may proceed with Cephalosporin use.   Latex Hives and Dermatitis   Lorazepam Other (See Comments)    PANIC   Metoclopramide  Other (See Comments)    Panic attack   Promethazine      Other Reaction(s): Fever  Panic attack  Other Reaction(s): Fever    Panic attack   Promethazine  Hcl     Other Reaction(s): Fever  Panic attack   Phenergan  [Promethazine  Hcl]     Panic attack    Medications Prior to Admission  Medication Sig Dispense Refill Last Dose/Taking   albuterol  (VENTOLIN  HFA) 108 (90 Base) MCG/ACT inhaler Inhale 2 puffs into the lungs every 6 (six) hours as needed for wheezing or shortness of breath.   11/03/2023   ARIPiprazole  (ABILIFY ) 10 MG tablet Take 1 tablet (10 mg total) by mouth daily. 30 tablet 11 11/03/2023   cyclobenzaprine  (FLEXERIL ) 10 MG tablet Take 1 tablet (10 mg total) by mouth 2 (two) times daily as needed for muscle spasms. 20 tablet 3 Past Week   heparin  10000 UNIT/ML injection Inject 1 mL (10,000 Units total) into the skin every 12 (twelve) hours. 5  mL 20 11/03/2023 at  5:15 AM   hydrOXYzine  (ATARAX ) 25 MG tablet Take 1 tablet (25 mg total) by mouth every 6 (six) hours. 30 tablet 3 11/02/2023   Prenatal Vit-Fe Phos-FA-Omega (VITAFOL  GUMMIES) 3.33-0.333-34.8 MG CHEW CHEW 1 TABLET BY MOUTH EVERY DAY 90 tablet 5 11/02/2023   sucralfate  (CARAFATE ) 1 GM/10ML suspension Take 10 mLs (1 g total) by mouth 4 (four) times daily -  with meals and at bedtime. 420 mL 0 11/03/2023   acetaminophen  (TYLENOL ) 325 MG tablet Take 650 mg by mouth daily as needed for pain.      Blood Pressure Monitoring (BLOOD PRESSURE KIT) DEVI 1 Device by Does not apply route once a week. 1 each 0    budesonide-glycopyrrolate-formoterol (BREZTRI  AEROSPHERE) 160-9-4.8 MCG/ACT AERO inhaler Inhale 2 puffs into the lungs in the morning and at bedtime. (Patient not taking: Reported on 10/17/2023) 10.7 g 5    calcium  carbonate (TUMS EX) 750 MG chewable tablet Chew 1 tablet by mouth at bedtime as needed (for nausea).      guaiFENesin -dextromethorphan (ROBITUSSIN DM) 100-10 MG/5ML syrup Take 5 mLs by mouth every 4 (four) hours as needed for cough.  118 mL 0    Multiple Vitamins-Minerals (MULTIVITAMIN WITH MINERALS) tablet Take 1 tablet by mouth daily.        Review of Systems   All systems reviewed and negative except as stated in HPI  Blood pressure 138/83, pulse (!) 107, temperature 98.4 F (36.9 C), temperature source Oral, resp. rate 17, height 5' 6 (1.676 m), weight 124.1 kg, last menstrual period 02/06/2023, SpO2 98%. General appearance: cooperative and fatigued Lungs: clear to auscultation bilaterally Heart: regular rate and rhythm Abdomen: soft, non-tender; bowel sounds normal Pelvic: 3 / 50 / -2 by Dr. Danny Extremities: Homans sign is negative, no sign of DVT DTR's WNL Presentation: cephalic  Fetal monitoring Baseline: 155 bpm, Variability: Good {> 6 bpm), Accelerations: Reactive, and Decelerations: Absent Uterine activityFrequency: irregular/uterine irritability and  Duration: 20-60 seconds  Dilation: 3 Effacement (%): 50 Station: -2 Exam by:: Dr. Danny   Prenatal labs: ABO, Rh: A/Positive/-- (03/20 0910) Antibody: Negative (03/20 0910) Rubella: 8.92 (03/20 0910) RPR: Non Reactive (08/05 0814)  HBsAg: Negative (03/20 0910)  HIV: Non Reactive (08/05 0814)  GBS: Negative/-- (09/19 1154)    Lab Results  Component Value Date   GBS Negative 10/17/2023   GTT: early HgbA1c 5.9 but normal 2hr Genetic screening WNL Anatomy US  WNL  Immunization History  Administered Date(s) Administered   DTP 05/28/1990, 07/23/1990, 11/02/1990   HIB, Unspecified 05/28/1990, 07/23/1990, 11/02/1990   Hep A / Hep B 03/27/2010, 04/27/2010, 09/25/2010   Influenza,inj,Quad PF,6+ Mos 11/06/2017   Influenza,inj,quad, With Preservative 11/06/2017   Meningococcal Conjugate 09/25/2010   OPV 05/28/1990, 07/23/1990   Tdap 03/05/2018, 09/02/2023    Prenatal Transfer Tool  Maternal Diabetes: No Genetic Screening: Abnormal:  Results: Other:CF carrier, FOB tested also a carrier. Amnio negative at 30+0. Maternal Ultrasounds/Referrals: Normal Fetal Ultrasounds or other Referrals:  Fetal echo for FOB born with cardiomyopathy. WNL, report in media tab 07/17/2023 and CareEverywhere 07/15/23 Maternal Substance Abuse:  Yes:  Type: Smoker, Other: h/o SUD, stable recovery ~10y Significant Maternal Medications:  Meds include: Other: Heparin  Significant Maternal Lab Results: Group B Strep negative Number of Prenatal Visits:greater than 3 verified prenatal visits Maternal Vaccinations:TDap Other Comments:  None   Results for orders placed or performed during the hospital encounter of 11/03/23 (from the past 24 hours)  Fern Test   Collection Time: 11/03/23  8:48 PM  Result Value Ref Range   POCT Fern Test Positive = ruptured amniotic membanes     Patient Active Problem List   Diagnosis Date Noted   Vision loss 10/17/2023   Thrombosis 10/17/2023   Nausea and vomiting in pregnancy  08/26/2023   Plantar fasciitis, bilateral 07/10/2023   Superficial venous thrombosis of arm, left 06/26/2023   Anxiety disorder affecting pregnancy, antepartum 06/05/2023   TIA (transient ischemic attack) 06/01/2023   BMI 40.0-44.9, adult (HCC) 05/29/2023   Abnormal glucose tolerance test 05/29/2023   Pathological nipple discharge 05/27/2023   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 [id number] at [redacted]w[redacted]d here for contractions and LOF at 8.   #  Labor: augment with pitocin  # Pain: Epidural # FWB: Cat 1 # GBS status:  negative  # LUE superficial thrombosis #TIA Admitted [facility]4-6. Multiple documentations of TIA, no CVA on imagine; ddx migranous. Extensive superficial thrombosis dx cephalic and basilic vv. Per MFM @35 +0:  Switch to Unfractionated heparin  10,000 units twice daily from 36-weeks' gestation. Discontinue lovenox  at 36 weeks. Hold heparin  for 24 hours before planned epidural analgesia. Restart lovenox  4 to 6 hours after vaginal delivery or 8 to 12 hours after cesarean delivery. Anticoagulation should be continued for at least 6 weeks.  - last dose of heparin  @ 0515 - ok for epidural - resume AC postpartum, consider po options  # Asthma, moderate intermittent No maintenance inhaler currently, has a pulmonologist. - using albuterol  PRN, last use today - avoid Hemabate if PPH  # Bipolar 1 disorder # Anxiety #PTSD - continue Abilify ,  hyrdroxyzine - PP mood check  #CF carriers #FOB CHD  Horizon showed CF carrier, FOB tested and also a carrier. Amnio negative. Genetics consult 06/26/2023. - Father born with CHD, now with cardiomyopathy - [facility]etal [facility] at [facility] WNL  # History of substance use disorder # Nicotine  use during pregnancy OUD/BZD stable recovery ~10y, no MAT. Tob use 1/2 ppd, not using NRT - NRT ordered during admission - gUS at 35wk AGA 37%  # History of preterm delivery G2, 34wk. vagP until 36 wk. CL nl at anatomy scan.  #trich At initial OB. TOC negative.    # Feeding: Breastmilk # Reproductive Life planning: Mirena  IUD or BTL if CS. Consent signed 06/26/2023 # Circ:  yes   Terrace Compton, MD  11/03/2023, 9:06 PM   Attestation of Attending Supervision of Resident: Evaluation and management procedures were performed by the learners: Family Medicine Resident under my supervision. I was immediately available for direct supervision, assistance and direction throughout this encounter.  I also confirm that I have verified the information documented in the resident's note, and that I have also personally reperformed the pertinent components of the physical exam and all of the medical decision making activities.  I have also made any necessary editorial changes.  Barabara Maier, DO FMOB Fellow, Faculty Practice [facility], [facility] for [employer]

## 2023-11-03 NOTE — H&P (Incomplete)
 OBSTETRIC ADMISSION HISTORY AND PHYSICAL  Taylor Buck is a 33 y.o. female [id number] with IUP at [redacted]w[redacted]d by LMP presenting for contractions and loss of clear fluid at 1950. She reports no VB, no blurry vision, headaches or peripheral edema, and RUQ pain.  Does not report feeling baby move but states this is normal for her. She plans on breast feeding. She request IUD Mirena , or BTL if CS for birth control.  [facility]   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  Rhogam  A/Positive/-- (03/20 0910)N/A A1C/GTT Early HgbA1C: 5.9 > normal 2h Third trimester 2 hr GTT:   Flu Vaccine        TDaP Vaccine  09/02/23 Blood Type A/Positive/-- (03/20 0910)  RSV Vaccine   Antibody Negative (03/20 0910)Negative (04/17/23)  COVID Vaccine   Rubella 8.92 (03/20 0910)  Feeding Plan  Breast RPR Non Reactive (08/05 9185)  Contraception  IUD in office, BTL if CS HBsAg Negative (03/20 0910)  Circumcision  Yes  HIV Non Reactive (08/05 0814)  Pediatrician  [facility] HCVAb Non Reactive (03/20 0910)  Prenatal Classes        BTL Consent   06/26/2023 Pap       Diagnosis  Date Value Ref Range Status  11/06/2017     Final    NEGATIVE FOR INTRAEPITHELIAL LESIONS OR MALIGNANCY.    BTL Pre-payment   GC/CT Initial:   36wks:    VBAC Consent   GBS Negative/-- (09/19 1154) For PCN allergy, check sensitivities   BRx Optimized? [ ]  yes   [x ] no      DME Rx [ x] BP cuff [ ]  Weight Scale Waterbirth  [ ]  Class [ ]   Consent [ ]  CNM visit  PHQ9 & GAD7 [  x] new OB [  ] 28 weeks  [  ] 36 weeks Induction  [ ]  Orders Entered [ ] Foley Y/N    Past Medical History: Past Medical History:  Diagnosis Date  . Anxiety   . Arthritis   . Asthma    inhaler laat used today  . Depression   . Heart murmur   . Infection    chlamydia age 65  . PCOS (polycystic ovarian syndrome)   . Sleep apnea   . TIA (transient ischemic attack) 2025  . Urinary tract infection     Past Surgical History: Past Surgical History:  Procedure Laterality Date  . CHOLECYSTECTOMY    . HAND SURGERY     reconstruction- injury due to frostbite as child  . HERNIA REPAIR     rt inguinal    Obstetrical History: OB History  Gravida Para Term Preterm AB Living  5 2 1 1 2 2   SAB IAB Ectopic Multiple Live Births  2 0 0 0  2    # Outcome Date GA Lbr Len/2nd Weight Sex Type Anes PTL Lv  5 Current           4 Term 05/27/18 [redacted]w[redacted]d 10:28 / 02:40 3100 g F Vag-Spont EPI  LIV  3 SAB 04/2017          2 Preterm 07/31/12 [redacted]w[redacted]d 07:00  M Vag-Spont None Y LIV  1 SAB 2008     SAB       Social History Social History   Socioeconomic History  . Marital status: Single    Spouse name: Not on file  . Number of children: Not on file  . Years of education: Not on file  . Highest education level: Not on file  Occupational History  . Not on file  Tobacco Use  . Smoking status: Former    Current packs/day: 0.00    Types: Cigarettes    Quit date: 10/2022    Years since quitting: 1.0    Passive exposure: Current  . Smokeless tobacco: Former  . Tobacco comments:    1 PACK A DAY - 4 CIGARETTES A DAY; nicotine  patches now  Vaping Use  . Vaping status: Never Used  Substance and Sexual Activity  . Alcohol use: No  . Drug use: Not Currently    Types: Marijuana    Comment: UP UNTIL +UPT  . Sexual activity: Not Currently    Birth control/protection: None  Other Topics Concern  . Not on file  Social History Narrative  . Not on file    Social Drivers of Health   Financial Resource Strain: High Risk (07/02/2023)   Received from [facility]   Overall Financial Resource Strain (CARDIA)   . Difficulty of Paying Living Expenses: Very hard  Food Insecurity: No Food Insecurity (07/02/2023)   Received [facility]   Hunger Vital Sign   . Within the past 12 months, you worried that your food would run out before you got the money to buy more.: Never true   . Within the past 12 months, the food you bought just didn't last and you didn't have money to get more.: Never true  Transportation Needs: No Transportation Needs (07/02/2023)   Received from [facility] - Transportation   . Lack of Transportation (Medical): No   . Lack of Transportation (Non-Medical): No  Physical Activity: Inactive (06/25/2023)   Exercise Vital Sign   . Days of Exercise per Week: 0 days   . Minutes of Exercise per Session: 0 min  Stress: Stress Concern Present (06/25/2023)   [employer] of Occupational Health - Occupational Stress Questionnaire   . Feeling of Stress : To some extent  Social Connections: Socially Integrated (06/25/2023)   Social Connection and Isolation Panel   . Frequency of Communication with Friends and Family: Twice a week   . Frequency of Social Gatherings with Friends and Family: Twice a week   . Attends Religious Services: 1 to 4 times per year   . Active Member of Clubs or Organizations: No   . Attends Banker Meetings: 1 to 4 times per year   . Marital Status: Living with partner    Family History: Family History  Problem Relation Age of Onset  . Asthma Mother   . COPD Mother   . Diabetes Maternal Aunt   . Cancer Maternal Aunt        breast  . Diabetes Maternal Grandmother   . Cancer Maternal Grandmother  skin cancer  . Alcoholism Other   . Arthritis Other   . Asthma Other   . Depression Other   . Hearing loss Neg Hx     Allergies: Allergies  Allergen Reactions  .  Milk (Cow) Anaphylaxis  . Milk-Related Compounds Anaphylaxis  . Penicillins Hives, Anaphylaxis and Dermatitis    Has patient had a PCN reaction causing immediate rash, facial/tongue/throat swelling, SOB or lightheadedness with hypotension: Yes  Has patient had a PCN reaction causing severe rash involving mucus membranes or skin necrosis: No  Has patient had a PCN reaction that required hospitalization: No  Has patient had a PCN reaction occurring within the last 10 years: No  If all of the above answers are NO, then may proceed with Cephalosporin use.  Has patient had a PCN reaction causing immediate rash, facial/tongue/throat swelling, SOB or lightheadedness with hypotension: Yes Has patient had a PCN reaction causing severe rash involving mucus membranes or skin necrosis: No Has patient had a PCN reaction that required hospitalization: No Has patient had a PCN reaction occurring within the last 10 years: No If all of the above answers are NO, then may proceed with Cephalosporin use.  . Latex Hives and Dermatitis  . Lorazepam Other (See Comments)    PANIC  . Metoclopramide  Other (See Comments)    Panic attack  . Promethazine      Other Reaction(s): Fever  Panic attack  Other Reaction(s): Fever    Panic attack  . Promethazine  Hcl     Other Reaction(s): Fever  Panic attack  . Phenergan  [Promethazine  Hcl]     Panic attack    Medications Prior to Admission  Medication Sig Dispense Refill Last Dose/Taking  . albuterol  (VENTOLIN  HFA) 108 (90 Base) MCG/ACT inhaler Inhale 2 puffs into the lungs every 6 (six) hours as needed for wheezing or shortness of breath.   11/03/2023  . ARIPiprazole  (ABILIFY ) 10 MG tablet Take 1 tablet (10 mg total) by mouth daily. 30 tablet 11 11/03/2023  . cyclobenzaprine  (FLEXERIL ) 10 MG tablet Take 1 tablet (10 mg total) by mouth 2 (two) times daily as needed for muscle spasms. 20 tablet 3 Past Week  . heparin  10000 UNIT/ML injection Inject 1 mL (10,000  Units total) into the skin every 12 (twelve) hours. 5 mL 20 11/03/2023 at  5:15 AM  . hydrOXYzine  (ATARAX ) 25 MG tablet Take 1 tablet (25 mg total) by mouth every 6 (six) hours. 30 tablet 3 11/02/2023  . Prenatal Vit-Fe Phos-FA-Omega (VITAFOL  GUMMIES) 3.33-0.333-34.8 MG CHEW CHEW 1 TABLET BY MOUTH EVERY DAY 90 tablet 5 11/02/2023  . sucralfate  (CARAFATE ) 1 GM/10ML suspension Take 10 mLs (1 g total) by mouth 4 (four) times daily -  with meals and at bedtime. 420 mL 0 11/03/2023  . acetaminophen  (TYLENOL ) 325 MG tablet Take 650 mg by mouth daily as needed for pain.     . Blood Pressure Monitoring (BLOOD PRESSURE KIT) DEVI 1 Device by Does not apply route once a week. 1 each 0   . budesonide-glycopyrrolate-formoterol (BREZTRI  AEROSPHERE) 160-9-4.8 MCG/ACT AERO inhaler Inhale 2 puffs into the lungs in the morning and at bedtime. (Patient not taking: Reported on 10/17/2023) 10.7 g 5   . calcium  carbonate (TUMS EX) 750 MG chewable tablet Chew 1 tablet by mouth at bedtime as needed (for nausea).     . guaiFENesin -dextromethorphan (ROBITUSSIN DM) 100-10 MG/5ML syrup Take 5 mLs by mouth every 4 (four) hours as needed for cough. 118 mL 0   . Multiple Vitamins-Minerals (  MULTIVITAMIN WITH MINERALS) tablet Take 1 tablet by mouth daily.        Review of Systems   All systems reviewed and negative except as stated in HPI  Blood pressure 138/83, pulse (!) 107, temperature 98.4 F (36.9 C), temperature source Oral, resp. rate 17, height 5' 6 (1.676 m), weight 124.1 kg, last menstrual period 02/06/2023, SpO2 98%. General appearance: cooperative and fatigued Lungs: clear to auscultation bilaterally Heart: regular rate and rhythm Abdomen: soft, non-tender; bowel sounds normal Pelvic: 3 / 50 / -2 by Dr. Danny Extremities: Homans sign is negative, no sign of DVT DTR's WNL Presentation: cephalic  Fetal monitoring Baseline: 155 bpm, Variability: Good {> 6 bpm), Accelerations: Reactive, and Decelerations:  Absent Uterine activityFrequency: irregular/uterine irritability and Duration: 20-60 seconds  Dilation: 3 Effacement (%): 50 Station: -2 Exam by:: Dr. Danny   Prenatal labs: ABO, Rh: A/Positive/-- (03/20 0910) Antibody: Negative (03/20 0910) Rubella: 8.92 (03/20 0910) RPR: Non Reactive (08/05 0814)  HBsAg: Negative (03/20 0910)  HIV: Non Reactive (08/05 0814)  GBS: Negative/-- (09/19 1154)    Lab Results  Component Value Date   GBS Negative 10/17/2023   GTT: early HgbA1c 5.9 but normal 2hr Genetic screening WNL Anatomy US  WNL  Immunization History  Administered Date(s) Administered  . DTP 05/28/1990, 07/23/1990, 11/02/1990  . HIB, Unspecified 05/28/1990, 07/23/1990, 11/02/1990  . Hep A / Hep B 03/27/2010, 04/27/2010, 09/25/2010  . Influenza,inj,Quad PF,6+ Mos 11/06/2017  . Influenza,inj,quad, With Preservative 11/06/2017  . Meningococcal Conjugate 09/25/2010  . OPV 05/28/1990, 07/23/1990  . Tdap 03/05/2018, 09/02/2023    Prenatal Transfer Tool  Maternal Diabetes: No Genetic Screening: Abnormal:  Results: Other:CF carrier, FOB tested also a carrier. Amnio negative at 30+0. Maternal Ultrasounds/Referrals: Normal Fetal Ultrasounds or other Referrals:  Fetal echo for FOB born with cardiomyopathy. WNL, report in media tab 07/17/2023 and CareEverywhere 07/15/23 Maternal Substance Abuse:  Yes:  Type: Smoker, Other: h/o SUD, stable recovery ~10y Significant Maternal Medications:  Meds include: Other: Heparin  Significant Maternal Lab Results: Group B Strep negative Number of Prenatal Visits:greater than 3 verified prenatal visits Maternal Vaccinations:TDap Other Comments:  None   Results for orders placed or performed during the hospital encounter of 11/03/23 (from the past 24 hours)  Fern Test   Collection Time: 11/03/23  8:48 PM  Result Value Ref Range   POCT Fern Test Positive = ruptured amniotic membanes     Patient Active Problem List   Diagnosis Date Noted  .  Vision loss 10/17/2023  . Thrombosis 10/17/2023  . Nausea and vomiting in pregnancy 08/26/2023  . Plantar fasciitis, bilateral 07/10/2023  . Superficial venous thrombosis of arm, left 06/26/2023  . Anxiety disorder affecting pregnancy, antepartum 06/05/2023  . TIA (transient ischemic attack) 06/01/2023  . BMI 40.0-44.9, adult (HCC) 05/29/2023  . Abnormal glucose tolerance test 05/29/2023  . Pathological nipple discharge 05/27/2023  . 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 Supervision of high risk pregnancy, antepartum 03/28/2023  . Trichomonas vaginitis 03/18/2023  . PCOS (polycystic ovarian syndrome) 10/11/2022  . Asthma 10/03/2022  . Family history of breast cancer 08/23/2021  . Nicotine  dependence, cigarettes, uncomplicated 08/23/2021  . Obstructive sleep apnea 08/23/2021  . Posttraumatic stress disorder 08/23/2021  . History of substance use disorder 03/05/2018  . History of preterm delivery 12/10/2017  . Bipolar 1 disorder (HCC) 12/10/2017   Assessment/Plan:  Taylor Buck is a 33 y.o. [id number] at [redacted]w[redacted]d here for contractions and LOF at 79.   # Labor: augmentation # Pain: Epidural #  FWB: Cat 1 # GBS status:  negative  # History of LUE superficial thombosis: L arm thrombosis 05/2023 in cephalic and basilic vv. Anticoagulated on Lovenox  until 36 weeks of pregnancy then switched to heparin  10,000 BID which she has been intermittently taking due to fear of needles. Last dose of heparin  @ 0515. Note that chart says TIA as well but this was ruled out on MRI.   # Parental CF carriers: Both mother and father. Amnio negative.  # FOB cardiomyopathy: Father had childhood cardiomyopathy. Fetal echo WNL  # History of opiate use disorder (in remission 10 years, not on MAT) # Nicotine  use during pregnancy: smoking 1/2 PPD. NRT ordered.  # History of preterm delivery (34 week SVD)  # Bipolar 1 disorder: Stable on Abilify . PPD check up # Anxiety: Atarax  PRN  # Asthma:  moderate intermittent on albuterol  # BMI 40-44.9   # Feeding: Breastmilk # Reproductive Life planning: Mirena  IUD or BTL if CS (already consented) # Circ:  yes   Terrace Compton, MD  11/03/2023, 9:06 PM

## 2023-11-03 NOTE — H&P (Incomplete Revision)
 OBSTETRIC ADMISSION HISTORY AND PHYSICAL  Taylor Buck is a 33 y.o. female [phone] with IUP at [redacted]w[redacted]d by LMP presenting for contractions and loss of clear fluid at 1950. She reports no VB, no blurry vision, headaches or peripheral edema, and RUQ pain.  Does not report feeling baby move but states this is normal for her. She plans on breast feeding. She request IUD Mirena , or BTL if CS for birth control.  [facility]   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  Rhogam  A/Positive/-- (03/20 0910)N/A A1C/GTT Early HgbA1C: 5.9 > normal 2h Third trimester 2 hr GTT:   Flu Vaccine        TDaP Vaccine  09/02/23 Blood Type A/Positive/-- (03/20 0910)  RSV Vaccine   Antibody Negative (03/20 0910)Negative (04/17/23)  COVID Vaccine   Rubella 8.92 (03/20 0910)  Feeding Plan  Breast RPR Non Reactive (08/05 9185)  Contraception  IUD in office, BTL if CS HBsAg Negative (03/20 0910)  Circumcision  Yes  HIV Non Reactive (08/05 0814)  Pediatrician  [facility] HCVAb Non Reactive (03/20 0910)  Prenatal Classes        BTL Consent   06/26/2023 Pap       Diagnosis  Date Value Ref Range Status  11/06/2017     Final    NEGATIVE FOR INTRAEPITHELIAL LESIONS OR MALIGNANCY.    BTL Pre-payment   GC/CT Initial:   36wks:    VBAC Consent   GBS Negative/-- (09/19 1154) For PCN allergy, check sensitivities   BRx Optimized? [ ]  yes   [x ] no      DME Rx [ x] BP cuff [ ]  Weight Scale  Waterbirth  [ ]  Class [ ]  Consent [ ]  CNM visit  PHQ9 & GAD7 [  x] new OB [  ] 28 weeks  [  ] 36 weeks Induction  [ ]  Orders Entered [ ] Foley Y/N    Past Medical History: Past Medical History:  Diagnosis Date   Anxiety    Arthritis    Asthma    inhaler laat used today   Depression    Heart murmur    Infection    chlamydia age 42   PCOS (polycystic ovarian syndrome)    Sleep apnea    TIA (transient ischemic attack) 2025   Urinary tract infection     Past Surgical History: Past Surgical History:  Procedure Laterality Date   CHOLECYSTECTOMY     HAND SURGERY     reconstruction- injury due to frostbite as child   HERNIA REPAIR     rt inguinal    Obstetrical History: OB History  Gravida Para Term Preterm AB Living  5 2 1 1 2 2   SAB IAB Ectopic Multiple Live Births  2 0  0 0 2    # Outcome Date GA Lbr Len/2nd Weight Sex Type Anes PTL Lv  5 Current           4 Term 05/27/18 [redacted]w[redacted]d 10:28 / 02:40 3100 g F Vag-Spont EPI  LIV  3 SAB 04/2017          2 Preterm 07/31/12 [redacted]w[redacted]d 07:00  M Vag-Spont None Y LIV  1 SAB 2008     SAB       Social History Social History   Socioeconomic History   Marital status: Single    Spouse name: Not on file   Number of children: Not on file   Years of education: Not on file   Highest education level: Not on file  Occupational History   Not on file  Tobacco Use   Smoking status: Former    Current packs/day: 0.00    Types: Cigarettes    Quit date: 10/2022    Years since quitting: 1.0    Passive exposure: Current   Smokeless tobacco: Former   Tobacco comments:    1 PACK A DAY - 4 CIGARETTES A DAY; nicotine  patches now  Vaping Use   Vaping status: Never Used  Substance and Sexual Activity   Alcohol use: No   Drug use: Not Currently    Types: Marijuana    Comment: UP UNTIL +UPT   Sexual activity: Not Currently    Birth control/protection: None  Other Topics Concern   Not on file  Social History Narrative   Not on file   Social  Drivers of Health   Financial Resource Strain: High Risk (07/02/2023)   Received from [facility]   Overall Financial Resource Strain (CARDIA)    Difficulty of Paying Living Expenses: Very hard  Food Insecurity: No Food Insecurity (07/02/2023)   Received from [facility]   Hunger Vital Sign    Within the past 12 months, you worried that your food would run out before you got the money to buy more.: Never true    Within the past 12 months, the food you bought just didn't last and you didn't have money to get more.: Never true  Transportation Needs: No Transportation Needs (07/02/2023)   Received from [facility] - Transportation    Lack of Transportation (Medical): No    Lack of Transportation (Non-Medical): No  Physical Activity: Inactive (06/25/2023)   Exercise Vital Sign    Days of Exercise per Week: 0 days    Minutes of Exercise per Session: 0 min  Stress: Stress Concern Present (06/25/2023)   [employer] of Occupational Health - Occupational Stress Questionnaire    Feeling of Stress : To some extent  Social Connections: Socially Integrated (06/25/2023)   Social Connection and Isolation Panel    Frequency of Communication with Friends and Family: Twice a week    Frequency of Social Gatherings with Friends and Family: Twice a week    Attends Religious Services: 1 to 4 times per year    Active Member of [employer] or Organizations: No    Attends Engineer, structural: 1 to 4 times per year    Marital Status: Living with partner    Family History: Family History  Problem Relation Age of Onset   Asthma Mother    COPD Mother    Diabetes Maternal Aunt    Cancer Maternal Aunt        breast   Diabetes Maternal Grandmother    Cancer Maternal  Grandmother        skin cancer   Alcoholism Other    Arthritis Other    Asthma Other    Depression Other    Hearing loss Neg Hx     Allergies: Allergies  Allergen Reactions   Milk (Cow) Anaphylaxis   Milk-Related  Compounds Anaphylaxis   Penicillins Hives, Anaphylaxis and Dermatitis    Has patient had a PCN reaction causing immediate rash, facial/tongue/throat swelling, SOB or lightheadedness with hypotension: Yes  Has patient had a PCN reaction causing severe rash involving mucus membranes or skin necrosis: No  Has patient had a PCN reaction that required hospitalization: No  Has patient had a PCN reaction occurring within the last 10 years: No  If all of the above answers are NO, then may proceed with Cephalosporin use.  Has patient had a PCN reaction causing immediate rash, facial/tongue/throat swelling, SOB or lightheadedness with hypotension: Yes Has patient had a PCN reaction causing severe rash involving mucus membranes or skin necrosis: No Has patient had a PCN reaction that required hospitalization: No Has patient had a PCN reaction occurring within the last 10 years: No If all of the above answers are NO, then may proceed with Cephalosporin use.   Latex Hives and Dermatitis   Lorazepam Other (See Comments)    PANIC   Metoclopramide  Other (See Comments)    Panic attack   Promethazine      Other Reaction(s): Fever  Panic attack  Other Reaction(s): Fever    Panic attack   Promethazine  Hcl     Other Reaction(s): Fever  Panic attack   Phenergan  [Promethazine  Hcl]     Panic attack    Medications Prior to Admission  Medication Sig Dispense Refill Last Dose/Taking   albuterol  (VENTOLIN  HFA) 108 (90 Base) MCG/ACT inhaler Inhale 2 puffs into the lungs every 6 (six) hours as needed for wheezing or shortness of breath.   11/03/2023   ARIPiprazole  (ABILIFY ) 10 MG tablet Take 1 tablet (10 mg total) by mouth daily. 30 tablet 11 11/03/2023   cyclobenzaprine  (FLEXERIL ) 10 MG tablet Take 1 tablet (10 mg total) by mouth 2 (two) times daily as needed for muscle spasms. 20 tablet 3 Past Week   heparin  10000 UNIT/ML injection Inject 1 mL (10,000 Units total) into the skin every 12 (twelve) hours. 5  mL 20 11/03/2023 at  5:15 AM   hydrOXYzine  (ATARAX ) 25 MG tablet Take 1 tablet (25 mg total) by mouth every 6 (six) hours. 30 tablet 3 11/02/2023   Prenatal Vit-Fe Phos-FA-Omega (VITAFOL  GUMMIES) 3.33-0.333-34.8 MG CHEW CHEW 1 TABLET BY MOUTH EVERY DAY 90 tablet 5 11/02/2023   sucralfate  (CARAFATE ) 1 GM/10ML suspension Take 10 mLs (1 g total) by mouth 4 (four) times daily -  with meals and at bedtime. 420 mL 0 11/03/2023   acetaminophen  (TYLENOL ) 325 MG tablet Take 650 mg by mouth daily as needed for pain.      Blood Pressure Monitoring (BLOOD PRESSURE KIT) DEVI 1 Device by Does not apply route once a week. 1 each 0    budesonide-glycopyrrolate-formoterol (BREZTRI  AEROSPHERE) 160-9-4.8 MCG/ACT AERO inhaler Inhale 2 puffs into the lungs in the morning and at bedtime. (Patient not taking: Reported on 10/17/2023) 10.7 g 5    calcium  carbonate (TUMS EX) 750 MG chewable tablet Chew 1 tablet by mouth at bedtime as needed (for nausea).      guaiFENesin -dextromethorphan (ROBITUSSIN DM) 100-10 MG/5ML syrup Take 5 mLs by mouth every 4 (four) hours as needed for cough.  118 mL 0    Multiple Vitamins-Minerals (MULTIVITAMIN WITH MINERALS) tablet Take 1 tablet by mouth daily.        Review of Systems   All systems reviewed and negative except as stated in HPI  Blood pressure 138/83, pulse (!) 107, temperature 98.4 F (36.9 C), temperature source Oral, resp. rate 17, height 5' 6 (1.676 m), weight 124.1 kg, last menstrual period 02/06/2023, SpO2 98%. General appearance: cooperative and fatigued Lungs: clear to auscultation bilaterally Heart: regular rate and rhythm Abdomen: soft, non-tender; bowel sounds normal Pelvic: 3 / 50 / -2 by Dr. Danny Extremities: Homans sign is negative, no sign of DVT DTR's WNL Presentation: cephalic  Fetal monitoring Baseline: 155 bpm, Variability: Good {> 6 bpm), Accelerations: Reactive, and Decelerations: Absent Uterine activityFrequency: irregular/uterine irritability and  Duration: 20-60 seconds  Dilation: 3 Effacement (%): 50 Station: -2 Exam by:: Dr. Danny   Prenatal labs: ABO, Rh: A/Positive/-- (03/20 0910) Antibody: Negative (03/20 0910) Rubella: 8.92 (03/20 0910) RPR: Non Reactive (08/05 0814)  HBsAg: Negative (03/20 0910)  HIV: Non Reactive (08/05 0814)  GBS: Negative/-- (09/19 1154)    Lab Results  Component Value Date   GBS Negative 10/17/2023   GTT: early HgbA1c 5.9 but normal 2hr Genetic screening WNL Anatomy US  WNL  Immunization History  Administered Date(s) Administered   DTP 05/28/1990, 07/23/1990, 11/02/1990   HIB, Unspecified 05/28/1990, 07/23/1990, 11/02/1990   Hep A / Hep B 03/27/2010, 04/27/2010, 09/25/2010   Influenza,inj,Quad PF,6+ Mos 11/06/2017   Influenza,inj,quad, With Preservative 11/06/2017   Meningococcal Conjugate 09/25/2010   OPV 05/28/1990, 07/23/1990   Tdap 03/05/2018, 09/02/2023    Prenatal Transfer Tool  Maternal Diabetes: No Genetic Screening: Abnormal:  Results: Other:CF carrier, FOB tested also a carrier. Amnio negative at 30+0. Maternal Ultrasounds/Referrals: Normal Fetal Ultrasounds or other Referrals:  Fetal echo for FOB born with cardiomyopathy. WNL, report in media tab 07/17/2023 and CareEverywhere 07/15/23 Maternal Substance Abuse:  Yes:  Type: Smoker, Other: h/o SUD, stable recovery ~10y Significant Maternal Medications:  Meds include: Other: Heparin  Significant Maternal Lab Results: Group B Strep negative Number of Prenatal Visits:greater than 3 verified prenatal visits Maternal Vaccinations:TDap Other Comments:  None   Results for orders placed or performed during the hospital encounter of 11/03/23 (from the past 24 hours)  Fern Test   Collection Time: 11/03/23  8:48 PM  Result Value Ref Range   POCT Fern Test Positive = ruptured amniotic membanes     Patient Active Problem List   Diagnosis Date Noted   Vision loss 10/17/2023   Thrombosis 10/17/2023   Nausea and vomiting in pregnancy  08/26/2023   Plantar fasciitis, bilateral 07/10/2023   Superficial venous thrombosis of arm, left 06/26/2023   Anxiety disorder affecting pregnancy, antepartum 06/05/2023   TIA (transient ischemic attack) 06/01/2023   BMI 40.0-44.9, adult (HCC) 05/29/2023   Abnormal glucose tolerance test 05/29/2023   Pathological nipple discharge 05/27/2023   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 H4E8877 at [redacted]w[redacted]d here for contractions and LOF at 8.   #  Labor: augment with pitocin  # Pain: Epidural # FWB: Cat 1 # GBS status:  negative  # LUE superficial thrombosis #TIA Admitted [facility]/4-6. Multiple documentations of TIA, no CVA on imagine; ddx migranous. Extensive superficial thrombosis dx cephalic and basilic vv. Per MFM @35 +0:  Switch to Unfractionated heparin  10,000 units twice daily from 36-weeks' gestation. Discontinue lovenox  at 36 weeks. Hold heparin  for 24 hours before planned epidural analgesia. Restart lovenox  4 to 6 hours after vaginal delivery or 8 to 12 hours after cesarean delivery. Anticoagulation should be continued for at least 6 weeks.  - last dose of heparin  @ 0515 - ok for epidural - resume AC postpartum, consider po options  # Asthma, moderate intermittent No maintenance inhaler currently, has a pulmonologist. - using albuterol  PRN, last use today - avoid Hemabate if PPH  # Bipolar 1 disorder # Anxiety #PTSD - continue Abilify ,  hyrdroxyzine - PP mood check  #CF carriers #FOB CHD  Horizon showed CF carrier, FOB tested and also a carrier. Amnio negative. Genetics consult 06/26/2023. - Father born with CHD, now with cardiomyopathy - [facility]etal echo at [facility] WNL  # History of substance use disorder # Nicotine  use during pregnancy OUD/BZD stable recovery ~10y, no MAT. Tob use 1/2 ppd, not using NRT - NRT ordered during admission - gUS at 35wk AGA 37%  # History of preterm delivery G2, 34wk. vagP until 36 wk. CL nl at anatomy scan.  #trich At initial OB. TOC negative.    # Feeding: Breastmilk # Reproductive Life planning: Mirena  IUD or BTL if CS. Consent signed 06/26/2023 # Circ:  yes   Terrace Compton, MD  11/03/2023, 9:06 PM   Attestation of Attending Supervision of Resident: Evaluation and management procedures were performed by the learners: Family Medicine Resident under my supervision. I was immediately available for direct supervision, assistance and direction throughout this encounter.  I also confirm that I have verified the information documented in the resident's note, and that I have also personally reperformed the pertinent components of the physical exam and all of the medical decision making activities.  I have also made any necessary editorial changes.  Barabara Maier, DO FMOB Fellow, Faculty Practice [facility], Center for [employer]

## 2023-11-03 NOTE — MAU Note (Signed)
 Taylor Buck is a 33 y.o. at [redacted]w[redacted]d here in MAU reporting: a gush of fluid at 1950, reports mild contractions. Reports she isn't feeling the baby move but reports this is normal for her.   Onset of complaint: 1950 Pain score: 0/10 There were no vitals filed for this visit.   FHT: 155  Lab orders placed from triage: fern

## 2023-11-04 ENCOUNTER — Inpatient Hospital Stay (HOSPITAL_COMMUNITY)

## 2023-11-04 ENCOUNTER — Ambulatory Visit

## 2023-11-04 ENCOUNTER — Encounter (HOSPITAL_COMMUNITY): Payer: Self-pay | Admitting: Obstetrics and Gynecology

## 2023-11-04 DIAGNOSIS — O99334 Smoking (tobacco) complicating childbirth: Secondary | ICD-10-CM

## 2023-11-04 DIAGNOSIS — Z3A38 38 weeks gestation of pregnancy: Secondary | ICD-10-CM

## 2023-11-04 DIAGNOSIS — O99344 Other mental disorders complicating childbirth: Secondary | ICD-10-CM

## 2023-11-04 LAB — RPR: RPR Ser Ql: NONREACTIVE

## 2023-11-04 MED ORDER — COCONUT OIL OIL: 1.0000 | TOPICAL_OIL | Status: DC | PRN

## 2023-11-04 MED ORDER — ACETAMINOPHEN 325 MG PO TABS
650.0000 mg | ORAL_TABLET | ORAL | Status: DC | PRN
  Administered 2023-11-05: 650 mg via ORAL
  Filled 2023-11-04: qty 2

## 2023-11-04 MED ORDER — SENNOSIDES-DOCUSATE SODIUM 8.6-50 MG PO TABS
2.0000 | ORAL_TABLET | Freq: Every day | ORAL | Status: DC
  Administered 2023-11-05: 2 via ORAL
  Filled 2023-11-04: qty 2

## 2023-11-04 MED ORDER — INFLUENZA VIRUS VACC SPLIT PF (FLUZONE) 0.5 ML IM SUSY
0.5000 mL | PREFILLED_SYRINGE | INTRAMUSCULAR | Status: AC
  Administered 2023-11-05: 0.5 mL via INTRAMUSCULAR
  Filled 2023-11-04: qty 0.5

## 2023-11-04 MED ORDER — SIMETHICONE 80 MG PO CHEW: 80.0000 mg | CHEWABLE_TABLET | ORAL | Status: DC | PRN

## 2023-11-04 MED ORDER — LIDOCAINE HCL (PF) 1 % IJ SOLN
INTRAMUSCULAR | Status: DC | PRN
  Administered 2023-11-04 (×2): 5 mL via EPIDURAL

## 2023-11-04 MED ORDER — DIBUCAINE (PERIANAL) 1 % EX OINT: 1.0000 | TOPICAL_OINTMENT | CUTANEOUS | Status: DC | PRN

## 2023-11-04 MED ORDER — ONDANSETRON HCL 4 MG/2ML IJ SOLN: 4.0000 mg | INTRAMUSCULAR | Status: DC | PRN

## 2023-11-04 MED ORDER — SALINE SPRAY 0.65 % NA SOLN
1.0000 | NASAL | Status: DC | PRN
  Administered 2023-11-04: 1 via NASAL
  Filled 2023-11-04: qty 44

## 2023-11-04 MED ORDER — ZOLPIDEM TARTRATE 5 MG PO TABS: 5.0000 mg | ORAL_TABLET | Freq: Every evening | ORAL | Status: DC | PRN

## 2023-11-04 MED ORDER — IBUPROFEN 600 MG PO TABS
600.0000 mg | ORAL_TABLET | Freq: Four times a day (QID) | ORAL | Status: DC
  Administered 2023-11-04 – 2023-11-05 (×4): 600 mg via ORAL
  Filled 2023-11-04 (×4): qty 1

## 2023-11-04 MED ORDER — WITCH HAZEL-GLYCERIN EX PADS: 1.0000 | MEDICATED_PAD | CUTANEOUS | Status: DC | PRN

## 2023-11-04 MED ORDER — ENOXAPARIN SODIUM 60 MG/0.6ML IJ SOSY
60.0000 mg | PREFILLED_SYRINGE | INTRAMUSCULAR | Status: DC
  Administered 2023-11-05: 60 mg via SUBCUTANEOUS
  Filled 2023-11-04: qty 0.6

## 2023-11-04 MED ORDER — TETANUS-DIPHTH-ACELL PERTUSSIS 5-2-15.5 LF-MCG/0.5 IM SUSP
0.5000 mL | Freq: Once | INTRAMUSCULAR | Status: DC
Start: 2023-11-05 — End: 2023-11-05

## 2023-11-04 MED ORDER — DIPHENHYDRAMINE HCL 25 MG PO CAPS: 25.0000 mg | ORAL_CAPSULE | Freq: Four times a day (QID) | ORAL | Status: DC | PRN

## 2023-11-04 MED ORDER — BENZOCAINE-MENTHOL 20-0.5 % EX AERO
1.0000 | INHALATION_SPRAY | CUTANEOUS | Status: DC | PRN
  Administered 2023-11-04: 1 via TOPICAL
  Filled 2023-11-04: qty 56

## 2023-11-04 MED ORDER — ONDANSETRON HCL 4 MG PO TABS: 4.0000 mg | ORAL_TABLET | ORAL | Status: DC | PRN

## 2023-11-04 MED ORDER — PRENATAL MULTIVITAMIN CH
1.0000 | ORAL_TABLET | Freq: Every day | ORAL | Status: DC
  Administered 2023-11-05: 1 via ORAL
  Filled 2023-11-04: qty 1

## 2023-11-04 NOTE — Anesthesia Preprocedure Evaluation (Signed)
 Anesthesia Evaluation  Patient identified by MRN, date of birth, ID band Patient awake    Reviewed: Allergy & Precautions, H&P , NPO status , Patient's Chart, lab work & pertinent test results  Airway Mallampati: II  TM Distance: >3 FB Neck ROM: Full    Dental no notable dental hx.    Pulmonary asthma , sleep apnea , Current Smoker   Pulmonary exam normal breath sounds clear to auscultation       Cardiovascular Normal cardiovascular exam Rhythm:Regular Rate:Normal  Hx of extensive superficial venous thrombosis in LUE during second trimester Lovenox  transitioned to heparin . Last heparin  dose 10/6 at 5am (22 hours ago)  TTE 05/2023: IMPRESSIONS     1. Left ventricular ejection fraction, by estimation, is 60 to 65%. The  left ventricle has normal function. The left ventricle has no regional  wall motion abnormalities. Left ventricular diastolic parameters were  normal. The average left ventricular  global longitudinal strain is -17.9 %. The global longitudinal strain is  normal.   2. Right ventricular systolic function is normal. The right ventricular  size is normal. Tricuspid regurgitation signal is inadequate for assessing  PA pressure.   3. The mitral valve is normal in structure. Trivial mitral valve  regurgitation. No evidence of mitral stenosis.   4. The aortic valve is tricuspid. Aortic valve regurgitation is not  visualized. No aortic stenosis is present.     Neuro/Psych  PSYCHIATRIC DISORDERS Anxiety Depression Bipolar Disorder   TIA   GI/Hepatic negative GI ROS,,,(+)     substance abuse    Endo/Other  Hx of PCOS  Renal/GU negative Renal ROS  negative genitourinary   Musculoskeletal  (+) Arthritis ,    Abdominal   Peds negative pediatric ROS (+)  Hematology negative hematology ROS (+)   Anesthesia Other Findings Plt 349  Reproductive/Obstetrics (+) Pregnancy                               Anesthesia Physical Anesthesia Plan  ASA: 3  Anesthesia Plan: Epidural   Post-op Pain Management:    Induction:   PONV Risk Score and Plan: 2 and Treatment may vary due to age or medical condition  Airway Management Planned: Natural Airway  Additional Equipment:   Intra-op Plan:   Post-operative Plan:   Informed Consent: I have reviewed the patients History and Physical, chart, labs and discussed the procedure including the risks, benefits and alternatives for the proposed anesthesia with the patient or authorized representative who has indicated his/her understanding and acceptance.       Plan Discussed with: Anesthesiologist  Anesthesia Plan Comments: (Patient identified. Risks, benefits, options discussed with patient including but not limited to bleeding, infection, nerve damage, paralysis, failed block, incomplete pain control, headache, blood pressure changes, nausea, vomiting, reactions to medication, itching, and post partum back pain. Confirmed with bedside nurse the patient's most recent platelet count. Confirmed with the patient that they are not taking any anticoagulation, have any bleeding history or any family history of bleeding disorders. Patient expressed understanding and wishes to proceed. All questions were answered. )         Anesthesia Quick Evaluation

## 2023-11-04 NOTE — Lactation Note (Signed)
 This note was copied from a baby's chart. Lactation Consultation Note  Patient Name: Taylor Buck Unijb'd Date: 11/04/2023 Age:33 years Reason for consult: Initial assessment;Early term 37-38.6wks.  Per MOB her goal is to exclusively breastfeed infant, infant given formula earlier due being jittery and having low blood sugar 33 mg/ dl. MOB informed LC she has 60 mls of colostrum at home that she will bring and supplement infant after he latches at the breast until his blood sugar is resolved. MOB latched infant on her right breast using the football hold position, infant latched with depth, swallows observed, LC did not assist with latch, infant breastfeed for 15 minutes and afterwards was given 4 mls of colostrum by spoon  that MOB hand express.MOB will continue to breastfeed infant by cues, on demand, 8-12 times within 24 hours, skin to skin. LC discussed importance of maternal rest, meals and hydration. MOB knows that frozen milk is safe for 24 hours in fridge. MOB plans only to supplement infant with EBM or  formula until infant's blood sugar is stabilized. MOB has handout  Feeding Guidelines and :Breast Milk Storage and Collection.   Maternal Data Has patient been taught Hand Expression?: Yes Does the patient have breastfeeding experience prior to this delivery?: Yes How long did the patient breastfeed?: Per MOB, she BF first child for 1 year and 2nd child for 6 months.  Feeding Mother's Current Feeding Choice: Breast Milk Nipple Type: Slow - flow  LATCH Score Latch: Grasps breast easily, tongue down, lips flanged, rhythmical sucking.  Audible Swallowing: Spontaneous and intermittent  Type of Nipple: Everted at rest and after stimulation  Comfort (Breast/Nipple): Soft / non-tender  Hold (Positioning): No assistance needed to correctly position infant at breast.  LATCH Score: 10   Lactation Tools Discussed/Used    Interventions Interventions: Breast feeding  basics reviewed;Skin to skin;Breast compression;Adjust position;Support pillows;Position options;Expressed milk;Hand express;DEBP;Education;CDC milk storage guidelines;CDC Guidelines for Breast Pump Cleaning;LC Services brochure;Guidelines for Milk Supply and Pumping Schedule Handout  Discharge Pump: DEBP;Personal  Consult Status Consult Status: Follow-up Date: 11/05/23 Follow-up type: In-patient    Grayce LULLA Batter 11/04/2023, 8:09 PM

## 2023-11-04 NOTE — Progress Notes (Addendum)
 Labor Progress Note Taylor Buck is a 33 y.o. H4E8877 at [redacted]w[redacted]d presented for contractions and loss of clear fluid at 1950.   S:  Had previously been walking around the unit until Chemult received the epidural at 0206. Was experiencing anxiety and received Atarax  at 0049 and 0537. Reports feeling relief from this. Resting comfortably with family in the room.   O:  BP 127/72   Pulse 94   Temp 98.7 F (37.1 C) (Oral)   Resp 18   Ht 5' 6 (1.676 m)   Wt 124.1 kg   LMP 02/06/2023 (Approximate)   SpO2 96%   BMI 44.16 kg/m   EFM: baseline 130 bpm/ mod variability/ no accels/decels present, no ctx capture to specify IUPC: placed 0800 with latex free gloves SVE: Dilation: 4 Effacement (%): 80 Station: -2 Presentation: Vertex Exam by:: Kirsteins MD Pitocin : 4 mu/min  A/P: 33 y.o. H4E8877 [redacted]w[redacted]d  1. Labor: continue pitocin , uptitrate per protocol 2. FWB: Category II 3. Pain: epidural  Terrace Compton, MD 8:01 AM  Attestation of Supervision of Resident: Evaluation and management procedures were performed by the learners: Family Medicine Resident under my supervision. I was immediately available for direct supervision, assistance and direction throughout this encounter.  I also confirm that I have verified the information documented in the resident's note, and that I have also personally reperformed the pertinent components of the physical exam and all of the medical decision making activities.  I have also made any necessary editorial changes.  Barabara Maier, DO FMOB Fellow, Faculty Practice [facility], Center for [employer]

## 2023-11-04 NOTE — Progress Notes (Signed)
 Labor Progress Note Taylor Buck is a 33 y.o. H4E8877 at [redacted]w[redacted]d presented for   S:  Feeling pressure, uncomfortable  O:  BP 113/66   Pulse 83   Temp 98.5 F (36.9 C)   Resp 18   Ht 5' 6 (1.676 m)   Wt 124.1 kg   LMP 02/06/2023 (Approximate)   SpO2 95%   BMI 44.16 kg/m  EFM: baseline 150 bpm/ mod variability/ + accels/ - decels  Toco/IUPC: ctx q2-71min SVE: Dilation: 6 Effacement (%): 80 Station: -1 Presentation: Vertex Exam by:: Diquan Kassis, CNM Pitocin : 8 mu/min  A/P: 33 y.o. H4E8877 [redacted]w[redacted]d  1. Labor: Continues on pitocin  for augmentation 2. Pain: Epidural 3. GBS negative  - Anticipate SVD.  Charlie DELENA Courts, MD 1:07 PM

## 2023-11-04 NOTE — Anesthesia Procedure Notes (Signed)
 Epidural Patient location during procedure: OB Start time: 11/04/2023 2:20 AM End time: 11/04/2023 2:40 AM  Staffing Anesthesiologist: Erma Thom SAUNDERS, MD Performed: anesthesiologist   Preanesthetic Checklist Completed: patient identified, IV checked, risks and benefits discussed, monitors and equipment checked, pre-op evaluation and timeout performed  Epidural Patient position: sitting Prep: DuraPrep Patient monitoring: heart rate, cardiac monitor, continuous pulse ox and blood pressure Approach: midline Location: L3-L4 Injection technique: LOR air  Needle:  Needle type: Tuohy  Needle gauge: 17 G Needle length: 9 cm Needle insertion depth: 8 cm Catheter type: closed end flexible Catheter size: 19 Gauge Catheter at skin depth: 13 cm Test dose: negative  Assessment Sensory level: T8  Additional Notes Patient identified. Risks/Benefits/Options discussed with patient including but not limited to bleeding, infection, nerve damage, paralysis, failed block, incomplete pain control, headache, blood pressure changes, nausea, vomiting, reactions to medication both or allergic, itching and postpartum back pain. Confirmed with bedside nurse the patient's most recent platelet count. Confirmed with patient that they are not currently taking any anticoagulation, have any bleeding history or any family history of bleeding disorders. Patient expressed understanding and wished to proceed. All questions were answered. Sterile technique was used throughout the entire procedure. Please see nursing notes for vital signs. Test dose was given through epidural catheter and negative prior to continuing to dose epidural or start infusion. Warning signs of high block given to the patient including shortness of breath, tingling/numbness in hands, complete motor block, or any concerning symptoms with instructions to call for help. Patient was given instructions on fall risk and not to get out of bed. All questions  and concerns addressed with instructions to call with any issues or inadequate analgesia.  Reason for block:procedure for pain

## 2023-11-04 NOTE — Discharge Summary (Signed)
 Postpartum Discharge Summary  Date of Service updated 11/04/2023     Patient Name: Taylor Buck DOB: 22-Aug-1990 MRN: [id number]  Date of admission: 11/03/2023 Delivery date:11/04/2023 Delivering provider: JOMARIE CAMPI A Date of discharge: 11/05/2023  Admitting diagnosis: History of TIA (transient ischemic attack) [Z86.73] Intrauterine pregnancy: [redacted]w[redacted]d     Secondary diagnosis:  Active Problems:   History of substance use disorder   Asthma   Obstructive sleep apnea   Posttraumatic stress disorder   Cystic fibrosis carrier   BMI 40.0-44.9, adult (HCC)   Anxiety disorder affecting pregnancy, antepartum   Superficial venous thrombosis of arm, left   Spontaneous vaginal delivery  Additional problems: None    Discharge diagnosis: Term Pregnancy Delivered                                              Post partum procedures:None Augmentation: AROM, Pitocin , and IUPC Complications: None  Hospital course: Onset of Labor With Vaginal Delivery      33 y.o. yo [id number] at [redacted]w[redacted]d was admitted in Latent Labor on 11/03/2023. Labor course with no complication  Membrane Rupture Time/Date: 7:50 PM,11/03/2023  Delivery Method:Vaginal, Spontaneous Operative Delivery:N/A Episiotomy: None Lacerations:  None Patient had an uncomplicated postpartum course. She is ambulating, tolerating a regular diet, passing flatus, and urinating well. Patient is discharged home in stable condition on 11/05/23.  Newborn Data: Birth date:11/04/2023 Birth time:2:35 PM Gender:Female Living status:Living Apgars:6 ,8  Weight:3400 g  Magnesium Sulfate received: No BMZ received: No Rhophylac:No MMR:N/A T-DaP:offered Flu: No RSV Vaccine received: No Transfusion:No  Immunizations received: Immunization History  Administered Date(s) Administered   DTP 05/28/1990, 07/23/1990, 11/02/1990   HIB, Unspecified 05/28/1990, 07/23/1990, 11/02/1990   Hep A / Hep B 03/27/2010, 04/27/2010, 09/25/2010    Influenza, Seasonal, Injecte, Preservative Fre 11/05/2023   Influenza,inj,Quad PF,6+ Mos 11/06/2017   Influenza,inj,quad, With Preservative 11/06/2017   Meningococcal Conjugate 09/25/2010   OPV 05/28/1990, 07/23/1990   Tdap 03/05/2018, 09/02/2023    Physical exam  Vitals:   11/05/23 0530 11/05/23 0624 11/05/23 1315 11/05/23 1655  BP: (!) 145/87 111/64 106/64   Pulse: 65 61 63   Resp: 18  18   Temp: 98.1 F (36.7 C)  97.7 F (36.5 C)   TempSrc: Oral  Oral   SpO2: 99%  100%   Weight:    122.5 kg  Height:       General: alert, cooperative, and no distress Lochia: appropriate Uterine Fundus: firm Incision: N/A DVT Evaluation: No significant calf/ankle edema. Labs: Lab Results  Component Value Date   WBC 12.9 (H) 11/05/2023   HGB 11.0 (L) 11/05/2023   HCT 34.1 (L) 11/05/2023   MCV 79.1 (L) 11/05/2023   PLT 326 11/05/2023      Latest Ref Rng & Units 11/03/2023    9:54 PM  CMP  Glucose 70 - 99 mg/dL 94   BUN 6 - 20 mg/dL 11   Creatinine 9.55 - 1.00 mg/dL 9.37   Sodium 864 - 854 mmol/L 137   Potassium 3.5 - 5.1 mmol/L 3.6   Chloride 98 - 111 mmol/L 101   CO2 22 - 32 mmol/L 22   Calcium  8.9 - 10.3 mg/dL 9.5   Total Protein 6.5 - 8.1 g/dL 6.6   Total Bilirubin 0.0 - 1.2 mg/dL 0.4   Alkaline Phos 38 - 126 U/L 148   AST  15 - 41 U/L 13   ALT 0 - 44 U/L 15    Edinburgh Score:    11/05/2023    8:26 AM  Edinburgh Postnatal Depression Scale Screening Tool  I have been able to laugh and see the funny side of things. 0  I have looked forward with enjoyment to things. 0  I have blamed myself unnecessarily when things went wrong. 1  I have been anxious or worried for no good reason. 1  I have felt scared or panicky for no good reason. 2  Things have been getting on top of me. 0  I have been so unhappy that I have had difficulty sleeping. 0  I have felt sad or miserable. 1  I have been so unhappy that I have been crying. 1  The thought of harming myself has occurred to me.  0  Edinburgh Postnatal Depression Scale Total 6   Edinburgh Postnatal Depression Scale Total: 6   After visit meds:  Allergies as of 11/05/2023       Reactions   Penicillins Hives, Anaphylaxis, Dermatitis   Has patient had a PCN reaction causing immediate rash, facial/tongue/throat swelling, SOB or lightheadedness with hypotension: Yes Has patient had a PCN reaction causing severe rash involving mucus membranes or skin necrosis: No Has patient had a PCN reaction that required hospitalization: No Has patient had a PCN reaction occurring within the last 10 years: No If all of the above answers are NO, then may proceed with Cephalosporin use. Has patient had a PCN reaction causing immediate rash, facial/tongue/throat swelling, SOB or lightheadedness with hypotension: Yes Has patient had a PCN reaction causing severe rash involving mucus membranes or skin necrosis: No Has patient had a PCN reaction that required hospitalization: No Has patient had a PCN reaction occurring within the last 10 years: No If all of the above answers are NO, then may proceed with Cephalosporin use.   Latex Hives, Dermatitis   Lorazepam Other (See Comments)   PANIC   Metoclopramide  Other (See Comments)   Panic attack   Promethazine     Other Reaction(s): Fever Panic attack Other Reaction(s): Fever    Panic attack   Promethazine  Hcl    Other Reaction(s): Fever Panic attack   Phenergan  [promethazine  Hcl]    Panic attack        Medication List     STOP taking these medications    calcium  carbonate 750 MG chewable tablet Commonly known as: TUMS EX   cyclobenzaprine  10 MG tablet Commonly known as: FLEXERIL    heparin  10000 UNIT/ML injection   multivitamin with minerals tablet   pantoprazole  40 MG tablet Commonly known as: PROTONIX    sucralfate  1 GM/10ML suspension Commonly known as: Carafate        TAKE these medications    acetaminophen  500 MG tablet Commonly known as: TYLENOL  Take 2  tablets (1,000 mg total) by mouth every 6 (six) hours as needed for mild pain (pain score 1-3) or moderate pain (pain score 4-6). What changed:  medication strength how much to take when to take this reasons to take this   albuterol  108 (90 Base) MCG/ACT inhaler Commonly known as: VENTOLIN  HFA Inhale 2 puffs into the lungs every 6 (six) hours as needed for wheezing or shortness of breath.   ARIPiprazole  10 MG tablet Commonly known as: Abilify  Take 1 tablet (10 mg total) by mouth daily.   Blood Pressure Kit Devi 1 Device by Does not apply route once a week.   Breztri   Aerosphere 160-9-4.8 MCG/ACT Aero inhaler Generic drug: budesonide-glycopyrrolate-formoterol Inhale 2 puffs into the lungs in the morning and at bedtime.   enoxaparin  120 MG/0.8ML injection Commonly known as: LOVENOX  Inject 0.8 mLs (120 mg total) into the skin 2 (two) times daily for 84 doses.   hydrOXYzine  25 MG tablet Commonly known as: ATARAX  Take 1 tablet (25 mg total) by mouth every 6 (six) hours.   ibuprofen  600 MG tablet Commonly known as: ADVIL  Take 1 tablet (600 mg total) by mouth every 6 (six) hours.   senna-docusate 8.6-50 MG tablet Commonly known as: Senokot-S Take 2 tablets by mouth at bedtime as needed for mild constipation or moderate constipation.   Vitafol  Gummies 3.33-0.333-34.8 MG Chew CHEW 1 TABLET BY MOUTH EVERY DAY         Discharge home in stable condition Infant Feeding: Breast Infant Disposition:home with mother Discharge instruction: per After Visit Summary and Postpartum booklet. Activity: Advance as tolerated. Pelvic rest for 6 weeks.  Diet: routine diet Future Appointments: Future Appointments  Date Time Provider Department Center  12/30/2023 10:00 AM [facility] PHLEBOTOMIST [facility] None  12/30/2023 10:20 AM Cloretta Arley NOVAK, MD [facility] None   Follow up Visit: Please schedule this patient for a In person postpartum visit in 6 weeks with the following provider: Any  provider. Additional Postpartum F/U:None  High risk pregnancy complicated by: asthma, hx of extensive superficial thrombosis transient vision loss on lovenox  fo 6wks postpartum Delivery mode:  Vaginal, Spontaneous Anticipated Birth Control:  Outpatient Mirena  IUD  Message sent to [facility] 10/7  11/05/2023 Leeroy NOVAK Pouch, MD

## 2023-11-05 ENCOUNTER — Other Ambulatory Visit (HOSPITAL_COMMUNITY): Payer: Self-pay

## 2023-11-05 LAB — CBC
HCT: 34.1 % — ABNORMAL LOW (ref 36.0–46.0)
Hemoglobin: 11 g/dL — ABNORMAL LOW (ref 12.0–15.0)
MCH: 25.5 pg — ABNORMAL LOW (ref 26.0–34.0)
MCHC: 32.3 g/dL (ref 30.0–36.0)
MCV: 79.1 fL — ABNORMAL LOW (ref 80.0–100.0)
Platelets: 326 K/uL (ref 150–400)
RBC: 4.31 MIL/uL (ref 3.87–5.11)
RDW: 15.7 % — ABNORMAL HIGH (ref 11.5–15.5)
WBC: 12.9 K/uL — ABNORMAL HIGH (ref 4.0–10.5)
nRBC: 0 % (ref 0.0–0.2)

## 2023-11-05 MED ORDER — SENNOSIDES-DOCUSATE SODIUM 8.6-50 MG PO TABS
2.0000 | ORAL_TABLET | Freq: Every evening | ORAL | 0 refills | Status: DC | PRN
  Filled 2023-11-05: qty 30, 15d supply, fill #0

## 2023-11-05 MED ORDER — ACETAMINOPHEN 500 MG PO TABS
1000.0000 mg | ORAL_TABLET | Freq: Four times a day (QID) | ORAL | 0 refills | Status: AC | PRN
  Filled 2023-11-05: qty 60, 8d supply, fill #0

## 2023-11-05 MED ORDER — IBUPROFEN 600 MG PO TABS
600.0000 mg | ORAL_TABLET | Freq: Four times a day (QID) | ORAL | 0 refills | Status: AC
  Filled 2023-11-05: qty 30, 8d supply, fill #0

## 2023-11-05 MED ORDER — ENOXAPARIN SODIUM 120 MG/0.8ML IJ SOSY
120.0000 mg | PREFILLED_SYRINGE | Freq: Two times a day (BID) | INTRAMUSCULAR | 0 refills | Status: DC
  Filled 2023-11-05: qty 48, 30d supply, fill #0

## 2023-11-05 NOTE — Lactation Note (Signed)
 This note was copied from a baby's chart. Lactation Consultation Note  Patient Name: Taylor Buck Unijb'd Date: 11/05/2023 Age:33 hours Reason for consult: Mother's request;Follow-up assessment  P3, 38 wks, @ 17 hrs of life. Mom requests LC, baby irritable @ latching R/T previous bottling. Mom attempting side lying position with LC arrival. Praised mom- experienced breast feeder- using experienced positioning. Encouraged mom - baby brand new/learning, cross-cradle or football positioning better for first days. Cross-cradle on left breast, Infant is irritable @ first . Used hand expression and breast compression with calming noises to get infant to focus on breast. Infant and mom take over well. Discussed feedings may be shorter duration R/T milk production, better production then other new moms. Hand pump provided, encouraged mom to also feed baby EBM after feeds to combat weight loss and jaundice- watch for overfeeding/ spit ups. Stomach size of 5-10 ml on first day anticipated.  Discussed expectations @ breast- Day 1- sleepy/ feed every 3 hrs/ even 10 minutes is okay, Day 2 more awake/ feeding cues/longer feeds, and cluster feeding overnights brings milk in. Highlighted breast stimulation is tied directly to milk production. Discussed hands on breast and baby, keeping baby awake @ breast. Starting with hand expression & breast compression to get baby working @ breast, and gentle stimulation to keep baby working @ breast. Encouraged mom to call for assist anytime desired.   Maternal Data Has patient been taught Hand Expression?: Yes Does the patient have breastfeeding experience prior to this delivery?: Yes How long did the patient breastfeed?: Per MOB, she BF first child for 1 year and 2nd child for 6 months.  Feeding Mother's Current Feeding Choice: Breast Milk and Formula (R/T low/borderline bloodsugar in first hours)  LATCH Score Latch: Grasps breast easily, tongue down,  lips flanged, rhythmical sucking. (Some irritability, free milk drops, and calming activities- Infant takes over well)  Audible Swallowing: Spontaneous and intermittent  Type of Nipple: Everted at rest and after stimulation  Comfort (Breast/Nipple): Soft / non-tender  Hold (Positioning): Assistance needed to correctly position infant at breast and maintain latch. (Mom takes over holds)  [facility] Score: 9   Lactation Tools Discussed/Used Tools: Pump;Flanges Flange Size: 21 Breast pump type: Manual Pump Education: Milk Storage;Setup, frequency, and cleaning  Interventions Interventions: Breast feeding basics reviewed;Assisted with latch;Hand express;Breast compression;Expressed milk;Hand pump;Education  Discharge Pump: DEBP;Manual;Personal  Consult Status Consult Status: Follow-up Date: 11/06/23 Follow-up type: In-patient    Inell Mimbs 11/05/2023, 8:10 AM
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
## 2023-11-05 NOTE — Anesthesia Postprocedure Evaluation (Signed)
 Anesthesia Post Note  Patient: Taylor Buck  Procedure(s) Performed: AN AD HOC LABOR EPIDURAL     Patient location during evaluation: Mother Baby Anesthesia Type: Epidural Level of consciousness: awake and alert Pain management: pain level controlled Vital Signs Assessment: post-procedure vital signs reviewed and stable Respiratory status: spontaneous breathing, nonlabored ventilation and respiratory function stable Cardiovascular status: stable Postop Assessment: no headache, no backache and epidural receding Anesthetic complications: no   No notable events documented.  Last Vitals:  Vitals:   11/05/23 0530 11/05/23 0624  BP: (!) 145/87 111/64  Pulse: 65 61  Resp: 18   Temp: 36.7 C   SpO2: 99%     Last Pain:  Vitals:   11/05/23 0826  TempSrc:   PainSc: 0-No pain   Pain Goal:                   Taylor Buck

## 2023-11-05 NOTE — Lactation Note (Signed)
 This note was copied from a baby's chart. Lactation Consultation Note  Patient Name: Boy Wonda Goodgame Unijb'd Date: 11/05/2023 Age:33 hours Reason for consult: Follow-up assessment;Mother's request;Early term 37-38.6wks;Maternal endocrine disorder  P3- MOB called out requesting assistance with formula feeding. LC placed infant in a side lying position. Infant was very gaggy and would not take the bottle even with chin/cheek support. After 15 minutes of unsuccessful feeding, LC decided to try syringe feeding infant. When LC placed infant in the crib, infant had a large mucus emesis and a large bowel movement that lasted 5 minutes of continuous stooling. Once infant was changed, LC finger fed infant 2 mL of EBM and 3 mL of formula. Infant started gagging again after this feeding, so LC discontinued the feeding. LC informed RN of how consult went. LC left infant upright in FOB's arms. LC encouraged the parents to call for further assistance as needed.  Maternal Data Does the patient have breastfeeding experience prior to this delivery?: Yes  Feeding Mother's Current Feeding Choice: Breast Milk Nipple Type: Extra Slow Flow  Interventions Interventions: Breast feeding basics reviewed;Hand express;Expressed milk;Education;Pace feeding;LC Services brochure  Discharge Discharge Education: Engorgement and breast care;Warning signs for feeding baby Pump: DEBP;Manual;Personal  Consult Status Consult Status: Follow-up Date: 11/06/23 Follow-up type: In-patient    Recardo Hoit BS, IBCLC 11/05/2023, 5:19 PM

## 2023-11-05 NOTE — Patient Instructions (Signed)
 Your appointment with Outpatient Lactation is: Date:11/20/2023 Time: 11:30 AM [facility] (First Floor) [address] [place]  Check in under baby's name.  Please bring your baby hungry along with your pump and a bottle of either formula or expressed breast milk. Please also bring your pump flanges and we welcome support people! If you need lactation assistance before your appointment, please call [phone] for lactation voice mail.   Lactation support groups:  [facility], Tuesdays 10:00 am -12:00 pm at [address] on the second floor in the conference room, lactating parents and lap babies welcome.  [email or web address]  [email or web address]      Taylor Buck, [facility]

## 2023-11-06 ENCOUNTER — Inpatient Hospital Stay (HOSPITAL_COMMUNITY)

## 2023-11-06 NOTE — Lactation Note (Signed)
 This note was copied from a baby's chart. Lactation Consultation Note  Patient Name: Taylor Buck Date: 11/06/2023 Age:33 hours, P3 , post circ  Reason for consult: Follow-up assessment;Early term 37-38.6wks;Infant weight loss Per mom has been pumping and cramping. Mom wonder how to decrease the cramping. LC recommended consistently emptying the bladder prior to breast feeding and pumping. LC reassured mom the cramping should ease up by 72 hours. Per mom her bleeding has been normal.  LC reviewed supply and demand, to establish the milk supply consistent pumping around the clock. If baby is latching and milk is coming in , can ease down from the pumping.  LC reviewed engorgement prevention and tx. And mom aware of the [facility] resources.    Maternal Data Has patient been taught Hand Expression?: Yes Does the patient have breastfeeding experience prior to this delivery?: Yes  Feeding Mother's Current Feeding Choice: Breast Milk Nipple Type:  (mom is using a bottle set she brought from home and feels its working the best)   Lactation Tools Discussed/Used Tools: Pump;Flanges Flange Size: 21 Breast pump type: Manual;Double-Electric Breast Pump Pump Education: Milk Storage;Setup, frequency, and cleaning  Interventions Interventions: Breast feeding basics reviewed;Hand pump;DEBP;Education;LC Services brochure;CDC milk storage guidelines;CDC Guidelines for Breast Pump Cleaning  Discharge Discharge Education: Engorgement and breast care;Warning signs for feeding baby Pump: DEBP;Manual;Personal  Consult Status Consult Status: Complete Date: 11/06/23    [name] 11/06/2023, 12:08 PM

## 2023-11-07 ENCOUNTER — Inpatient Hospital Stay (HOSPITAL_COMMUNITY)

## 2023-11-07 ENCOUNTER — Inpatient Hospital Stay (HOSPITAL_COMMUNITY): Admit: 2023-11-07 | Source: Home / Self Care | Admitting: Obstetrics & Gynecology

## 2023-11-09 ENCOUNTER — Encounter (HOSPITAL_BASED_OUTPATIENT_CLINIC_OR_DEPARTMENT_OTHER): Admitting: Internal Medicine

## 2023-11-09 DIAGNOSIS — G4733 Obstructive sleep apnea (adult) (pediatric): Secondary | ICD-10-CM

## 2023-11-09 NOTE — Procedures (Signed)
 [facility] [address] [place], [place] [address] Tel: [phone]   Fax: [phone]  Polysomnography Interpretation  Patient Name:  Taylor Buck, Taylor Buck Study Date:  10/31/2023 Referring Physician:  JOAN GILLIS [phone]) %%startinterp%% Indications for Polysomnography The patient is a 33 year-old Female who is 5' 6 and weighs 270.0 lbs. Her BMI equals 43.9.  A full night polysomnogram was performed to evaluate for -OSA  Medication  No Data.   Polysomnogram Data A full night polysomnogram recorded the standard physiologic parameters including EEG, EOG, EMG, EKG, nasal and oral airflow.  Respiratory parameters of chest and abdominal movements were recorded with Respiratory Inductance Plethysmography belts.  Oxygen saturation was recorded by pulse oximetry.   Sleep Architecture The total recording time of the polysomnogram was 397.9 minutes.  The total sleep time was 311.5 minutes.  The patient spent 14.4% of total sleep time in Stage N1, 42.4% in Stage N2, 19.9% in Stages N3, and 23.3% in REM.  Sleep latency was 3.3 minutes.  REM latency was 89.5 minutes.  Sleep Efficiency was 78.3%.  Wake after Sleep Onset time was 83.0 minutes.  Respiratory Events The polysomnogram revealed a presence of 3 obstructive, - central, and - mixed apneas resulting in an Apnea index of 0.6 events per hour.  There were 119 hypopneas (>=3% desaturation and/or arousal) resulting in an Apnea\Hypopnea Index (AHI >=3% desaturation and/or arousal) of 23.5 events per hour.  There were 104 hypopneas (>=4% desaturation) resulting in an Apnea\Hypopnea Index (AHI >=4% desaturation) of 20.6 events per hour.  There were 85 Respiratory Effort Related Arousals resulting in a RERA index of 16.4 events per hour. The Respiratory Disturbance Index is 39.9 events per hour.  The snore index was - events per hour.  Mean oxygen saturation was 92.2%.  The lowest oxygen  saturation during sleep was 81.0%.  Time spent <=88% oxygen saturation was 20.3 minutes (5.8%).  Limb Activity There were - total limb movements recorded, of this total, - were classified as PLMs.  PLM index was - per hour and PLM associated with Arousals index was - per hour.  Cardiac Summary The average pulse rate was 82.1 bpm.  The minimum pulse rate was 46.0 bpm while the maximum pulse rate was 155.0 bpm.  Cardiac rhythm was normal.  Comments: Severe obstructive sleep apnea, AHI(3%) 23.5/hr, desaturation to 60.5%, mean 92%.  Patient is pregnant. CPAP titration to 11 cwp, EPR 3.   Diagnosis: Obstructive sleep apnea  Recommendations: Suggest autopap 5-15, which may accommodate changes after delivery, or fixed CPAP 11, EPR 3. Patient wore a shallow/ wide N30i Touch mask with heated humidity.    This study was personally reviewed and electronically signed by: Neysa Rama Accredited Board Certified in Sleep Medicine Date/Time: 11/09/23  1:30    %%endinterp%%   Diagnostic PSG Report  Patient Name: Taylor Buck, Taylor Buck Study Date: 10/31/2023  Date of Birth: 09/07/1990 Study Type: Split Night  Age: 33 year MRN #: [id number]  Sex: Female Interpreting Physician: NEYSA RAMA, [id number]  Height: 5' 6 Referring Physician: CANDIS DANDY [phone])  Weight: 270.0 lbs Recording Tech: Shawnee Heugly RPSGT RST  BMI: 43.9 Scoring Tech: Shawnee Heugly RPSGT RST  ESS: 14 Neck Size: 15.5   Study Overview  Lights Off: 10:11:49 PM  Count Index  Lights On: 04:49:40 AM Awakenings: 17 3.3  Time in Bed: 397.9 min. Arousals: 208 40.1  Total Sleep Time: 311.5 min. AHI (>=3% Desat and/or Ar.): 122 23.5  Sleep Efficiency: 78.3% AHI (>=4% Desat): 107 20.6   Sleep Latency: 3.3 min. Limb Movements: - -  Wake After Sleep Onset: 83.0 min. Snore: - -  REM Latency from Sleep Onset: 89.5 min. Desaturations: 175 33.7     Minimum SpO2 TST: 81.0%    Sleep Architecture  % of Time in Bed Stages Time  (mins) % Sleep Time  Wake 87.0   Stage N1 45.0 14.4%  Stage N2 132.0 42.4%  Stage N3 62.0 19.9%  REM 72.5 23.3%   Arousal Summary   NREM REM Sleep Index  Respiratory Arousals 159 22 181 34.9  PLM Arousals - - - -  Isolated Limb Movement Arousals - - - -  Snore Arousals - - - -  Spontaneous Arousals 21 6 27  5.2  Total 180 28 208 40.1   Limb Movement Summary   Count Index  Isolated Limb Movements - -  Periodic Limb Movements (PLMs) - -  Total Limb Movements - -    Respiratory Summary   By Sleep Stage By Body Position Total   NREM REM Supine Non-Supine   Time (min) 239.0 72.5 - 311.5 311.5         Obstructive Apnea - 3 - 3 3  Mixed Apnea - - - - -  Central Apnea - - - - -  Total Apneas - 3 - 3 3  Total Apnea Index - 2.5 - 0.6 0.6         Hypopneas (>=3% Desat and/or Ar.) 93 26 - 119 119  AHI (>=3% Desat and/or Ar.) 23.3 24.0 - 23.5 23.5         Hypopneas (>=4% Desat) 80 24 - 104 104  AHI (>=4% Desat) 20.1 22.3 - 20.6 20.6          RERAs 80 5 - 85 85  RERA Index 20.1 4.1 - 16.4 16.4         RDI 43.4 28.1 - 39.9 39.9    Respiratory Event Type Index  Central Apneas -  Obstructive Apneas 0.6  Mixed Apneas -  Central Hypopneas -  Obstructive Hypopneas -  Central Apnea + Hypopnea (CAHI) -  Obstructive Apnea + Hypopnea (OAHI) 24.1   Respiratory Event Durations   Apnea Hypopnea   NREM REM NREM REM  Average (seconds) - 18.9 23.7 30.4  Maximum (seconds) - 21.0 78.0 106.0    Oxygen Saturation Summary   Wake NREM REM TST TIB  Average SpO2 (%) 94.0% 92.3% 91.1% 92.0% 92.2%  Minimum SpO2 (%) 87.0% 84.0% 81.0% 81.0% 81.0%  Maximum SpO2 (%) 97.0% 98.0% 95.0% 98.0% 98.0%   Oxygen Saturation Distribution  Range (%) Time in range (min) Time in range (%)  90.0 - 100.0 291.6 82.8%  80.0 - 90.0 60.5 17.2%  70.0 - 80.0 - -  60.0 - 70.0 - -  50.0 - 60.0 - -  0.0 - 50.0 - -  Time Spent <=88% SpO2  Range (%) Time in range (min) Time in range (%)  0.0 - 88.0 20.3  5.8%      Count Index  Desaturations 175 33.7    Cardiac Summary   Wake NREM REM Sleep Total  Average Pulse Rate (BPM) 89.3 78.1 90.5 81.0 82.1  Minimum Pulse Rate (BPM) 46.0 53.0 60.0 53.0 46.0  Maximum Pulse Rate (BPM) 155.0 117.0 113.0 117.0 155.0   Pulse Rate Distribution:  Range (bpm) Time in range (min) Time in range (%)  0.0 - 40.0 - -  40.0 -  60.0 0.6 0.2%  60.0 - 80.0 170.8 48.3%  80.0 - 100.0 160.7 45.4%  100.0 - 120.0 21.6 6.1%  120.0 - 140.0 0.1 0.0%  140.0 - 200.0 0.1 0.0%      Hypnograms                      Technologist Comments  The patient is a 33 year old female who is [redacted] weeks pregnant.  She is ordered to the sleep lab for Split night polysomnogram due to a history of OSA (and BiPAP use) which is currently untreated.  The order states the patient has not used PAP therapy in years and needs intervention ASAP.  She was oriented to room 6 and the ordered procedure was explained in detail, all questions were answered.  She takes no medication for sleep aid and took no medication in the lab.  She reports she is not able to comfortably sleep on her back at this time due to her pregnancy and is turning side to side.  She gets up several times a night to use the restroom. The patient's EEG appeared to be within normal limits.  Sleep architecture was fragmented especially prior to PAP application.  Several REM periods and more continuous sleep was observed while on PAP therapy.  The patient slept side to side primarily with many extra pillows.  She was too hot and efforts were taken to help her feel more comfortable for her stay.  Nocturia occurred three times.  No periodic limb movements were observed.  Snoring was moderate to loud and there were sometimes coughing arousals.  The patient showed severe OSA with an AHI of 42/hr (4% oxygen desaturation).  Events were worse in REM sleep.  The patient was placed on CPAP of 5 cm H2O however she required EPR  of 3 and some extra coaching and encouragement initially as she had intolerance to air pressure.  CPAP was titrated to a maximum of 11 cm H2O with EPR of 3 which she seemed to tolerate well after her initial difficulties.  There was rare breakthrough snore noted at the final pressure however the patient was ready to get up and out of the bed due to discomfort.  She was fit for ResMed N30i Touch in a shallow/wide which sealed well throughout the study.  The ECG demonstrated what appeared to be normal sinus rhythm.                        Reggy Salt Diplomate, Biomedical engineer of Sleep Medicine  ELECTRONICALLY SIGNED ON:  11/09/2023, 1:10 PM [facility] PH: [phone]   FX: [phone] ACCREDITED BY THE AMERICAN ACADEMY OF SLEEP MEDICINE

## 2023-11-10 ENCOUNTER — Encounter (HOSPITAL_BASED_OUTPATIENT_CLINIC_OR_DEPARTMENT_OTHER): Payer: Self-pay

## 2023-11-10 ENCOUNTER — Other Ambulatory Visit (HOSPITAL_COMMUNITY): Payer: Self-pay

## 2023-11-10 ENCOUNTER — Other Ambulatory Visit: Payer: Self-pay

## 2023-11-10 DIAGNOSIS — G4733 Obstructive sleep apnea (adult) (pediatric): Secondary | ICD-10-CM

## 2023-11-10 NOTE — Progress Notes (Signed)
  Order placed for AutoPAP based on sleep study results.  Briefly:  Comments: Severe obstructive sleep apnea, AHI(3%) 23.5/hr, desaturation to 60.5%, mean 92%.  Patient is pregnant. CPAP titration to 11 cwp, EPR 3.    Diagnosis: Obstructive sleep apnea   Recommendations: Suggest autopap 5-15, which may accommodate changes after delivery, or fixed CPAP 11, EPR 3. Patient wore a shallow/ wide N30i Touch mask with heated humidity.    MyChart message sent to patient.

## 2023-11-12 ENCOUNTER — Other Ambulatory Visit: Payer: Self-pay | Admitting: Obstetrics & Gynecology

## 2023-11-12 ENCOUNTER — Telehealth: Payer: Self-pay

## 2023-11-12 ENCOUNTER — Telehealth (HOSPITAL_COMMUNITY): Payer: Self-pay | Admitting: *Deleted

## 2023-11-12 DIAGNOSIS — F319 Bipolar disorder, unspecified: Secondary | ICD-10-CM

## 2023-11-12 MED ORDER — QUETIAPINE FUMARATE 50 MG PO TABS: 50.0000 mg | ORAL_TABLET | Freq: Every day | ORAL | 1 refills | Status: AC

## 2023-11-12 NOTE — Telephone Encounter (Signed)
 11/12/2023  Name: Taylor Buck MRN: [id number] DOB: 1990-02-16  Reason for Call:  Transition of Care Hospital Discharge Call  Contact Status: Patient Contact Status: Complete  Language assistant needed: Interpreter Mode: Interpreter Not Needed        Follow-Up Questions: Do You Have Any Concerns About Your Health As You Heal From Delivery?: Yes What Concerns Do You Have About Your Health?: Patient notes feeling pressure with urination. Endorses urinary urgency and the feeling of not emptying bladder. Denies burning with urination. Denies fever.  Encouraged patient to call provider to r/o UTI. Do You Have Any Concerns About Your Infants Health?: Yes What Concerns Do You Have About Your Baby?: She thinks baby may be constipated since he is straining with stooling and has only stooled once in the last 24 hours. Stool was of soft consistency. He had been having multiple stools/day. Discussed normals of newborn stooling and parameters for calling pediatrician. They are still working on breastfeeding and establishing a good milk supply. She is working with an Printmaker and feels she has the resources she needs for newborn feeding. She also noted a drop of blood at umbilicus after cord came off. Advised that this can be normal and to continue to monitor the site.  Edinburgh Postnatal Depression Scale:  In the Past 7 Days: I have been able to laugh and see the funny side of things.: As much as I always could I have looked forward with enjoyment to things.: As much as I ever did I have blamed myself unnecessarily when things went wrong.: Not very often I have been anxious or worried for no good reason.: Hardly ever I have felt scared or panicky for no good reason.: No, not at all Things have been getting on top of me.: No, most of the time I have coped quite well I have been so unhappy that I have had difficulty sleeping.: Not at all I have felt sad or miserable.: No, not at all I have  been so unhappy that I have been crying.: Only occasionally The thought of harming myself has occurred to me.: Never Edinburgh Postnatal Depression Scale Total: 4  PHQ2-9 Depression Scale:     Discharge Follow-up: Edinburgh score requires follow up?: No Patient was advised of the following resources:: Support Group, Breastfeeding Support Group  Post-discharge interventions: Reviewed Newborn Safe Sleep Practices  Mliss Sieve, RN 11/12/2023 15:38

## 2023-11-12 NOTE — Telephone Encounter (Signed)
 S/w Leartis after [facility] lactation consultant Rick) notified us  that the pt was not taking Abilify  due to decrease milk production. Consulted with provider in office and advised pt that Seroquel would be sent as alternative. Left lactation consultant a vm with update.

## 2023-11-12 NOTE — Progress Notes (Signed)
 Meds ordered this encounter  Medications   QUEtiapine (SEROQUEL) 50 MG tablet    Sig: Take 1 tablet (50 mg total) by mouth at bedtime.    Dispense:  30 tablet    Refill:  1

## 2023-12-08 ENCOUNTER — Telehealth: Payer: Self-pay

## 2023-12-08 NOTE — Telephone Encounter (Signed)
 Returning vm pt left - pt states she is breastfeeding and had unprotected intercourse this morning. Pt wants to know if it is safe to take a plan B , per Up to Date and Dr. Zina - pt is safe to take one plan B . Pt verbalizes understanding.

## 2023-12-11 ENCOUNTER — Ambulatory Visit: Admitting: Obstetrics and Gynecology

## 2023-12-15 ENCOUNTER — Other Ambulatory Visit: Payer: Self-pay

## 2023-12-15 ENCOUNTER — Ambulatory Visit: Admitting: Allergy

## 2023-12-15 DIAGNOSIS — F53 Postpartum depression: Secondary | ICD-10-CM

## 2023-12-24 ENCOUNTER — Other Ambulatory Visit: Payer: Self-pay | Admitting: Nurse Practitioner

## 2023-12-24 DIAGNOSIS — I82612 Acute embolism and thrombosis of superficial veins of left upper extremity: Secondary | ICD-10-CM

## 2023-12-30 ENCOUNTER — Inpatient Hospital Stay: Attending: Oncology

## 2023-12-30 ENCOUNTER — Inpatient Hospital Stay: Admitting: Oncology

## 2023-12-30 ENCOUNTER — Telehealth: Payer: Self-pay | Admitting: Oncology

## 2023-12-30 VITALS — BP 104/69 | HR 92 | Temp 97.8°F | Resp 18 | Ht 66.0 in | Wt 251.3 lb

## 2023-12-30 DIAGNOSIS — I82612 Acute embolism and thrombosis of superficial veins of left upper extremity: Secondary | ICD-10-CM

## 2023-12-30 DIAGNOSIS — Z86718 Personal history of other venous thrombosis and embolism: Secondary | ICD-10-CM | POA: Diagnosis present

## 2023-12-30 DIAGNOSIS — Z803 Family history of malignant neoplasm of breast: Secondary | ICD-10-CM | POA: Diagnosis not present

## 2023-12-30 LAB — ANTITHROMBIN III: AntiThromb III Func: 116 % (ref 75–120)

## 2023-12-30 NOTE — Telephone Encounter (Signed)
 PT called stating that she will be 10 mins late to her appt.

## 2023-12-30 NOTE — Progress Notes (Signed)
  [facility] OFFICE PROGRESS NOTE   Diagnosis: Thrombosis  INTERVAL HISTORY:   Mr. Taylor Buck delivered a baby on 11/04/2023.  She took Lovenox  throughout pregnancy and continued until 12/21/2023.  No symptom of venous thrombosis throughout pregnancy.  She had an IV placed in the right arm during the [facility]very admission and reports a clot at the IV site lasting 12 hours.  No persistent swelling or pain in the right arm. She has chronic wheezing secondary to asthma. Objective:  Vital signs in last 24 hours:  Blood pressure 104/69, pulse 92, temperature 97.8 F (36.6 C), temperature source Temporal, resp. rate 18, height 5' 6 (1.676 m), weight 251 lb 4.8 oz (114 kg), last menstrual period 02/06/2023, SpO2 98%, unknown if currently breastfeeding.    Resp: Lungs with mild bilateral inspiratory wheeze at the posterior chest, no respiratory distress Cardio: Regular rate and rhythm GI: No hepatosplenomegaly Vascular: No arm or leg edema, no erythema or palpable cord at the right arm   Lab Results:  Lab Results  Component Value Date   WBC 12.9 (H) 11/05/2023   HGB 11.0 (L) 11/05/2023   HCT 34.1 (L) 11/05/2023   MCV 79.1 (L) 11/05/2023   PLT 326 11/05/2023   NEUTROABS 6.1 06/01/2023    CMP  Lab Results  Component Value Date   NA 137 11/03/2023   K 3.6 11/03/2023   CL 101 11/03/2023   CO2 22 11/03/2023   GLUCOSE 94 11/03/2023   BUN 11 11/03/2023   CREATININE 0.62 11/03/2023   CALCIUM  9.5 11/03/2023   PROT 6.6 11/03/2023   ALBUMIN 2.6 (L) 11/03/2023   AST 13 (L) 11/03/2023   ALT 15 11/03/2023   ALKPHOS 148 (H) 11/03/2023   BILITOT 0.4 11/03/2023   GFRNONAA >60 11/03/2023   GFRAA >60 04/25/2018    Medications: I have reviewed the patient's current medications.   Assessment/Plan: Left upper extremity thrombus in the left cephalic and basilic veins on Doppler ultrasound 06/01/2023, treated as DVT due to the extensive superficial thrombus, Lovenox   continued throughout remainder of pregnancy and 6 weeks postpartum, last given 12/21/2023 History of phlebitis after lab draws 11/04/2023 term pregnancy, vaginal delivery PCOS Plantar fasciitis Sleep apnea Asthma Anxiety/depression Family history of breast cancer Mother with history of clots .    Disposition: Taylor Buck completed Lovenox  anticoagulation throughout and following pregnancy.  She had no evidence of recurrent venous thrombosis after being diagnosed with an extensive superficial thrombus in the left upper extremity in May.  She is now maintained off of anticoagulation therapy.  The episode of a clot lasting for 12 hours after a recent IV stick was most likely unrelated to superficial phlebitis.  I recommend she remain off of anticoagulation therapy.  She will alert treating physicians of her history including if she becomes pregnant or has surgery in the future.  She will seek medical attention for symptoms of venous thrombosis.  We obtained a full hypercoagulation panel today given her personal and reported family history of thrombosis.  She declined a referral to the genetics counselor due to the family history of breast cancer.  We will contact her when these results are available.  She is not scheduled for a follow-up appointment in the hematology clinic.  We are available to see her as needed.  Arley Hof, MD  12/30/2023  11:00 AM

## 2023-12-31 ENCOUNTER — Ambulatory Visit: Payer: Self-pay | Admitting: Primary Care

## 2023-12-31 LAB — HOMOCYSTEINE: Homocysteine: 8.7 umol/L (ref 0.0–14.5)

## 2023-12-31 NOTE — Telephone Encounter (Signed)
 FYI Only or Action Required?: Action required by provider: clinical question for provider and update on patient condition.  Patient is followed in Pulmonology for OSA, last seen on 09/23/2023 by Charley Conger, PA-C.  Called Nurse Triage reporting Asthma.  Symptoms began several days ago.  Interventions attempted: Rescue inhaler and Nebulizer treatments.  Symptoms are: gradually worsening.  Triage Disposition: See HCP Within 4 Hours (Or PCP Triage)  Patient/caregiver understands and will follow disposition?:    Copied from CRM [id number]. Topic: Clinical - Red Word Triage >> Dec 31, 2023 12:28 PM Whitney O wrote: [facility] that prompted transfer to Nurse Triage: asthma flaring up and dont have a inhaler and using a rescue inhaler  Almarie ferrari or joan gillis [facility] Reason for Disposition  [1] Continuous (nonstop) coughing AND [2] keeps from working or sleeping AND [3] not improved after 2 or 3 inhaler or nebulizer treatments given 20 minutes apart  Answer Assessment - Initial Assessment Questions Pt called in stating that with the cooler weather and the dry heat in homes, she is having more asthma attacks. She currently does not have a maintenance inhaler as it was not approved by insurance. She reports having to use her albuterol  inhaler up to 3x daily and is doing neb treatments at home twice daily. She is worried d/t amount of albuterol  she is taking in daily as well as what feels like throat swelling during attacks. She feels like throat is the size of a straw. Discussed I would send message to her provider as there are no appts until 12/09. Appointment scheduled for evaluation. Patient agrees with plan of care, and will call back if anything changes, or if symptoms worsen.   1. RESPIRATORY STATUS: Describe your breathing? (e.g., wheezing, shortness of breath, unable to speak, severe coughing)      Currently denies any distress; pt reports that her new maintenance inhaler is not  approved by insurance so she only has her albuterol  rescue inhaler. She is also using nebulizer treatments 2x a day.   2. ONSET: When did this asthma attack begin?      Last couple days flares have been worse; feels like her throat is a straw  3. TRIGGER: What do you think triggered this attack? (e.g., URI, exposure to pollen or other allergen, tobacco smoke)      Dry heat with colder weather   4. SEVERITY: How bad is this attack?      Feels like she is breathing through a straw   5. ASTHMA MEDICINES:  What treatments have you tried?      Albuterol  inhaler 2-3x a day; neb home treatments 2x a day   6. OTHER SYMPTOMS: Do you have any other symptoms? (e.g., chest pain, coughing up yellow sputum, fever, runny nose)     Chest tightness when flared; no current distress  Protocols used: Asthma Attack-A-AH

## 2023-12-31 NOTE — Telephone Encounter (Signed)
 ATCx1 unable to Leave message if patient call back schedule with provider at [address] location

## 2024-01-01 ENCOUNTER — Ambulatory Visit: Admitting: Obstetrics and Gynecology

## 2024-01-01 LAB — FACTOR 5 LEIDEN

## 2024-01-01 LAB — PROTEIN S, TOTAL: Protein S Ag, Total: 98 % (ref 60–150)

## 2024-01-01 LAB — LUPUS ANTICOAGULANT PANEL
DRVVT: 51.6 s — ABNORMAL HIGH (ref 0.0–47.0)
PTT Lupus Anticoagulant: 43.7 s — ABNORMAL HIGH (ref 0.0–43.5)

## 2024-01-01 LAB — PTT-LA MIX: PTT-LA Mix: 38.6 s (ref 0.0–40.5)

## 2024-01-01 LAB — PTT-LA INCUB MIX: PTT-LA Incub Mix: 43.1 s — ABNORMAL HIGH (ref 0.0–40.5)

## 2024-01-01 LAB — HEXAGONAL PHASE PHOSPHOLIPID: Hexagonal Phase Phospholipid: 10 s (ref 0–11)

## 2024-01-01 LAB — BETA-2-GLYCOPROTEIN I ABS, IGG/M/A
Beta-2 Glyco I IgG: <9 GPI IgG units (ref 0–20)
Beta-2-Glycoprotein I IgA: <9 GPI IgA units (ref 0–25)
Beta-2-Glycoprotein I IgM: <9 GPI IgM units (ref 0–32)

## 2024-01-01 LAB — PROTEIN C ACTIVITY: Protein C Activity: 178 % (ref 73–180)

## 2024-01-01 LAB — PROTEIN C, TOTAL: Protein C, Total: 110 % (ref 60–150)

## 2024-01-01 LAB — DRVVT CONFIRM: dRVVT Confirm: 1.2 ratio (ref 0.8–1.2)

## 2024-01-01 LAB — PROTEIN S ACTIVITY: Protein S Activity: 73 % (ref 63–140)

## 2024-01-01 LAB — DRVVT MIX: dRVVT Mix: 43 s — ABNORMAL HIGH (ref 0.0–40.4)

## 2024-01-01 NOTE — Telephone Encounter (Signed)
 Called and spoke with patient, she states that the [facility] [employer]. Location is closer for her.  We see her for asthma and sleep.  She is having issues with her asthma.  I scheduled her to see Dr. Theodoro to establish care with asthma and sleep as she has only seen Candis Dandy PA at [facility] in the past.  She is scheduled for 01/06/24 at 11:30 am, advised to arrive at 11:15 am for check in.  Address provided to patient.  She verbalized understanding.  Nothing further needed.

## 2024-01-02 LAB — CARDIOLIPIN ANTIBODIES, IGG, IGM, IGA
Anticardiolipin IgA: <9 U/mL (ref 0–11)
Anticardiolipin IgG: <9 GPL U/mL (ref 0–14)
Anticardiolipin IgM: 9 [MPL'U]/mL (ref 0–12)

## 2024-01-02 LAB — PROTHROMBIN GENE MUTATION

## 2024-01-06 ENCOUNTER — Ambulatory Visit (HOSPITAL_BASED_OUTPATIENT_CLINIC_OR_DEPARTMENT_OTHER)

## 2024-01-06 ENCOUNTER — Ambulatory Visit

## 2024-01-12 ENCOUNTER — Other Ambulatory Visit: Payer: Self-pay

## 2024-01-12 DIAGNOSIS — I8290 Acute embolism and thrombosis of unspecified vein: Secondary | ICD-10-CM

## 2024-01-19 ENCOUNTER — Telehealth: Payer: Self-pay

## 2024-01-19 NOTE — Telephone Encounter (Signed)
 ATC in order to bring in machine

## 2024-01-20 ENCOUNTER — Encounter: Admitting: Pulmonary Disease

## 2024-01-20 NOTE — Assessment & Plan Note (Deleted)
 Treatment: CPAP 5-15 cm H2O with *** mask. DME: ***. Adherence: ***.

## 2024-01-20 NOTE — Progress Notes (Deleted)
 "  Established Patient Pulmonology Office Visit   Subjective:  Patient ID: Taylor Buck, female    DOB: February 08, 1990  MRN: [id number]  CC: No chief complaint on file.   HPI 33 y.o. F with asthma and severe OSA (AHI3% 23.5, SpO2 nadir in 60s, study performed at 38 weeks of gestation) with recent pregnancy s/p uncomplicated delivery in late 10/2023 who presents for follow up in setting of recent CPAP initiation as well as worsening of asthma control.  Asthma: - Prescribed Breztri  but not taking*** - Using albuterol  rescue inhaler *** times per ***. - Nighttime asthma symptoms: *** per *** - Exacerbations in past year: *** - Prior hospitalization due to asthma: ***   Symptoms Therapy   Step 1:  - Symptoms < 2x / week - No risk factors for exacerbations  Low-dose ICS-formoterol  PRN (preferred)  2.   Low-dose ICS whenever SABA used  3.   Low-dose ICS-SABA PRN   Step 2:  - Symptoms 3 - 5 times per week - No troublesome daily symptoms   Low-dose ICS-formoterol  PRN (preferred)  Low-dose ICS daily + SABA PRN  ...  LTRA daily + SABA PRN   Step 3:  - Symptoms almost every day - Awakenings at night >\= 1x / week - Low lung function (e.g. FEV1 60-80%)   Low-dose ICS-formoterol  as maintenance and reliever therapy (i.e. daily + PRN) (preferred)  Low-dose ICS-LABA daily + SABA PRN  ...  Low-dose ICS plus LTRA daily + (SABA or ICS-SABA PRN)   Step 4:  - Daily symptoms - Awakenings at night >\= 1x / week - Low lung function (e.g. FEV1 60-80%) - Recent acute exacerbation   Medium-dose ICS-formoterol  as maintenance and reliever therapy (I.e. daily + PRN) (preferred)  Medium-dose ICS-LABA daily + (SABA or ICS-SABA PRN)     Pulm Questionnaires:      No data to display           ROS  {History (Optional):24728} Current Medications[1]      Objective:  LMP 02/06/2023 (Approximate)  {Pulm Vitals (Optional):33986}  Physical  Exam Constitutional:      Appearance: Normal appearance.  HENT:     Head: Normocephalic and atraumatic.     Mouth/Throat:     Mouth: Mucous membranes are moist.     Pharynx: Oropharynx is clear. No oropharyngeal exudate or posterior oropharyngeal erythema.  Eyes:     General: No scleral icterus.    Conjunctiva/sclera: Conjunctivae normal.  Cardiovascular:     Rate and Rhythm: Normal rate and regular rhythm.     Heart sounds: Normal heart sounds. No murmur heard.    No friction rub. No gallop.  Pulmonary:     Effort: Pulmonary effort is normal. No respiratory distress.     Breath sounds: Normal breath sounds. No stridor. No wheezing, rhonchi or rales.  Musculoskeletal:     Right lower leg: No edema.     Left lower leg: No edema.  Neurological:     Mental Status: She is alert and oriented to person, place, and time.  Psychiatric:        Thought Content: Thought content normal.      Diagnostic Review:  RAR:[facility] metabolic panel Lab Results  Component Value Date   WBC 12.9 (H) 11/05/2023   HGB 11.0 (L) 11/05/2023   HCT 34.1 (L) 11/05/2023   MCV 79.1 (L) 11/05/2023   MCH 25.5 (L) 11/05/2023   RDW 15.7 (H) 11/05/2023   PLT 326 11/05/2023   Metabolic  Panel : Last metabolic panel Lab Results  Component Value Date   GLUCOSE 94 11/03/2023   NA 137 11/03/2023   K 3.6 11/03/2023   CL 101 11/03/2023   CO2 22 11/03/2023   BUN 11 11/03/2023   CREATININE 0.62 11/03/2023   GFRNONAA >60 11/03/2023   CALCIUM  9.5 11/03/2023   PROT 6.6 11/03/2023   ALBUMIN 2.6 (L) 11/03/2023   BILITOT 0.4 11/03/2023   ALKPHOS 148 (H) 11/03/2023   AST 13 (L) 11/03/2023   ALT 15 11/03/2023   ANIONGAP 14 11/03/2023   No results found for: BNP   Sleep Studies:       Assessment & Plan:   Assessment & Plan Asthma, unspecified asthma severity, unspecified whether complicated, unspecified whether persistent Treatment: Breztri  (ICS-LAMA-LABA) + Albuterol     OSA (obstructive sleep  apnea) Treatment: CPAP 5-15 cm H2O with *** mask. DME: ***. Adherence: ***.    BMI 40.0-44.9, adult (HCC)       No follow-ups on file.   [name], MD     [1]  Current Outpatient Medications:    acetaminophen  (TYLENOL ) 500 MG tablet, Take 2 tablets (1,000 mg total) by mouth every 6 (six) hours as needed for mild pain (pain score 1-3) or moderate pain (pain score 4-6)., Disp: 60 tablet, Rfl: 0   albuterol  (VENTOLIN  HFA) 108 (90 Base) MCG/ACT inhaler, Inhale 2 puffs into the lungs every 6 (six) hours as needed for wheezing or shortness of breath., Disp: , Rfl:    Blood Pressure Monitoring (BLOOD PRESSURE KIT) DEVI, 1 Device by Does not apply route once a week., Disp: 1 each, Rfl: 0   budesonide -glycopyrrolate -formoterol  (BREZTRI  AEROSPHERE) 160-9-4.8 MCG/ACT AERO inhaler, Inhale 2 puffs into the lungs in the morning and at bedtime. (Patient not taking: Reported on 12/30/2023), Disp: 10.7 g, Rfl: 5   hydrOXYzine  (ATARAX ) 25 MG tablet, Take 1 tablet (25 mg total) by mouth every 6 (six) hours. (Patient not taking: Reported on 12/30/2023), Disp: 30 tablet, Rfl: 3   ibuprofen  (ADVIL ) 600 MG tablet, Take 1 tablet (600 mg total) by mouth every 6 (six) hours., Disp: 30 tablet, Rfl: 0   QUEtiapine  (SEROQUEL ) 50 MG tablet, Take 1 tablet (50 mg total) by mouth at bedtime. (Patient not taking: Reported on 12/30/2023), Disp: 30 tablet, Rfl: 1  "

## 2024-01-20 NOTE — Assessment & Plan Note (Deleted)
 SABRA

## 2024-01-20 NOTE — Assessment & Plan Note (Deleted)
 Treatment: Breztri  (ICS-LAMA-LABA) + Albuterol 

## 2024-02-18 ENCOUNTER — Encounter: Payer: Self-pay | Admitting: Licensed Clinical Social Worker

## 2024-03-11 ENCOUNTER — Institutional Professional Consult (permissible substitution): Payer: Self-pay | Admitting: Obstetrics and Gynecology

## 2024-03-18 ENCOUNTER — Inpatient Hospital Stay
